# Patient Record
Sex: Female | Born: 1960 | Race: Black or African American | Hispanic: No | Marital: Married | Smoking: Never smoker
Health system: Southern US, Community
[De-identification: ages and names within clinical notes are randomized; demographics above are authoritative.]

## PROBLEM LIST (undated history)

## (undated) DIAGNOSIS — D352 Benign neoplasm of pituitary gland: Secondary | ICD-10-CM

## (undated) DIAGNOSIS — D219 Benign neoplasm of connective and other soft tissue, unspecified: Secondary | ICD-10-CM

## (undated) DIAGNOSIS — E785 Hyperlipidemia, unspecified: Secondary | ICD-10-CM

## (undated) DIAGNOSIS — I2510 Atherosclerotic heart disease of native coronary artery without angina pectoris: Secondary | ICD-10-CM

## (undated) HISTORY — DX: Atherosclerotic heart disease of native coronary artery without angina pectoris: I25.10

## (undated) HISTORY — DX: Benign neoplasm of pituitary gland: D35.2

## (undated) HISTORY — PX: MYOMECTOMY: SHX85

## (undated) HISTORY — PX: TOE SURGERY: SHX1073

---

## 2001-07-21 ENCOUNTER — Emergency Department: Admit: 2001-07-21 | Payer: Self-pay | Source: Emergency Department | Admitting: Internal Medicine

## 2006-04-11 ENCOUNTER — Ambulatory Visit: Admit: 2006-04-11 | Disposition: A | Discharge status: Home / Self Care | Payer: Self-pay | Source: Ambulatory Visit

## 2009-03-22 ENCOUNTER — Ambulatory Visit
Admit: 2009-03-22 | Disposition: A | Discharge status: Home / Self Care | Payer: Self-pay | Source: Ambulatory Visit | Admitting: Specialist

## 2013-04-04 ENCOUNTER — Encounter (HOSPITAL_COMMUNITY): Payer: Self-pay

## 2013-04-04 ENCOUNTER — Ambulatory Visit (INDEPENDENT_AMBULATORY_CARE_PROVIDER_SITE_OTHER): Payer: Federal, State, Local not specified - PPO | Admitting: Family Medicine

## 2013-04-04 ENCOUNTER — Ambulatory Visit (HOSPITAL_COMMUNITY)
Admission: RE | Admit: 2013-04-04 | Discharge: 2013-04-04 | Disposition: A | Discharge status: Home / Self Care | Payer: Federal, State, Local not specified - PPO | Source: Ambulatory Visit | Attending: Family Medicine | Admitting: Family Medicine

## 2013-04-04 VITALS — BP 126/88 | HR 73 | Temp 98.6°F | Resp 16 | Ht 64.0 in | Wt 186.0 lb

## 2013-04-04 DIAGNOSIS — R05 Cough: Secondary | ICD-10-CM

## 2013-04-04 DIAGNOSIS — R079 Chest pain, unspecified: Secondary | ICD-10-CM

## 2013-04-04 DIAGNOSIS — J9819 Other pulmonary collapse: Secondary | ICD-10-CM | POA: Insufficient documentation

## 2013-04-04 DIAGNOSIS — R059 Cough, unspecified: Secondary | ICD-10-CM

## 2013-04-04 DIAGNOSIS — R0602 Shortness of breath: Secondary | ICD-10-CM | POA: Insufficient documentation

## 2013-04-04 DIAGNOSIS — I2584 Coronary atherosclerosis due to calcified coronary lesion: Secondary | ICD-10-CM | POA: Insufficient documentation

## 2013-04-04 MED ORDER — IOHEXOL 350 MG/ML SOLN
100.0000 mL | Freq: Once | INTRAVENOUS | Status: AC | PRN
  Administered 2013-04-04: 100 mL via INTRAVENOUS

## 2013-04-04 MED ORDER — HYDROCOD POLST-CHLORPHEN POLST 10-8 MG/5ML PO LQCR: 5.0000 mL | Freq: Two times a day (BID) | ORAL | Status: DC | PRN

## 2013-04-04 NOTE — Patient Instructions (Signed)

## 2013-04-04 NOTE — Progress Notes (Signed)
Subjective:   This chart was scribed for Leslie SidleKurt Lauenstein, MD, by Leslie Hurst, scribe. The pt's care was started at 6:29 PM.    Patient ID: Leslie Hurst, female    DOB: 09/17/1960, 53 y.o.   MRN: [id number]  Chief Complaint  Patient presents with   Chest Pain    this morning off and on, sharp   Leg Pain    upper hamstring    HPI  Leslie Hurst is a 53 y.o. female who presents to [facility] complaining of sudden-onset chest pains which awakened her from sleep this morning. The chest pain returned this afternoon while the pt was eating a salad. She also reports pain to her right groin and upper leg which occurred while ambulating this morning. The pt took IBU without relief. She denies SOB or pain with inspiration. She denies any recent strenuous activities or increased weight of her travel baggage. She reports multiple transcontinental airplane trips in the past month. She passed a cardiac stress test recently.   She reports flu-like symptoms including rhinorrhea, fatigue, and a "deep, penetrating" cough a few days ago. The cough has not resolved and intermittently interrupts her sleep.   She reports her mother had an M.I. at 53 years-old.   Leslie Hurst works for the [employer].   History reviewed. No pertinent past medical history.  Past Surgical History  Procedure Laterality Date   Toe surgery     No current outpatient prescriptions on file prior to visit.   No current facility-administered medications on file prior to visit.   History   Social History   Marital Status: Married   Occupational History   Not on file.   Social History Main Topics   Smoking status: Never Smoker    Smokeless tobacco: Not on file   Alcohol Use: Not on file   Drug Use: Not on file   Sexual Activity: Not on file    Review of Systems  Constitutional: Positive for fatigue.  HENT: Positive for rhinorrhea.   Respiratory: Positive for cough. Negative for shortness of breath.   Cardiovascular:  Positive for chest pain.  Musculoskeletal: Positive for myalgias.  Neurological: Positive for weakness.  All other systems reviewed and are negative.      Objective:   Physical Exam  Nursing note and vitals reviewed. Constitutional: She is oriented to person, place, and time. She appears well-developed and well-nourished. No distress.  HENT:  Head: Normocephalic and atraumatic.  Eyes: EOM are normal.  Neck: Neck supple. No tracheal deviation present.  Cardiovascular: Normal rate.   No tachycardia.   Pulmonary/Chest: Effort normal. No respiratory distress.  Musculoskeletal: Normal range of motion.  Neurological: She is alert and oriented to person, place, and time.  Skin: Skin is warm and dry.  Psychiatric: She has a normal mood and affect. Her behavior is normal.   Vitals: BP 126/88   Pulse 73   Temp(Src) 98.6 F (37 C)   Resp 16   Ht 5\' 4"  (1.626 m)   Wt 186 lb (84.369 kg)   BMI 31.91 kg/m2   SpO2 98%     Assessment & Plan:  Chest pain - Plan: chlorpheniramine-HYDROcodone (TUSSIONEX PENNKINETIC ER) 10-8 MG/5ML LQCR, CT Angio Chest W/Cm &/Or Wo Cm, CANCELED: CT Angio Chest W/Cm &/Or Wo Cm  Cough - Plan: chlorpheniramine-HYDROcodone (TUSSIONEX PENNKINETIC ER) 10-8 MG/5ML LQCR, CT Angio Chest W/Cm &/Or Wo Cm, CANCELED: CT Angio Chest W/Cm &/Or Wo Cm  Patient's history is suspicious for pulmonary emboli  with the leg pain and the abrupt onset of chest pain. Given the fact patient had a normal stress test within the last couple months,  Cardiac etiology is very unlikely.  We will proceed with a CT angiogram to rule out pulmonary embolus tonight Signed, Leslie Sidle, MD

## 2013-04-05 ENCOUNTER — Telehealth: Payer: Self-pay

## 2013-04-05 ENCOUNTER — Emergency Department (HOSPITAL_COMMUNITY)
Admission: EM | Admit: 2013-04-05 | Discharge: 2013-04-05 | Disposition: A | Discharge status: Home / Self Care | Payer: Federal, State, Local not specified - PPO | Attending: Emergency Medicine | Admitting: Emergency Medicine

## 2013-04-05 ENCOUNTER — Encounter (HOSPITAL_COMMUNITY): Payer: Self-pay | Admitting: Emergency Medicine

## 2013-04-05 ENCOUNTER — Emergency Department (HOSPITAL_COMMUNITY): Payer: Federal, State, Local not specified - PPO

## 2013-04-05 DIAGNOSIS — R0602 Shortness of breath: Secondary | ICD-10-CM | POA: Insufficient documentation

## 2013-04-05 DIAGNOSIS — R0789 Other chest pain: Secondary | ICD-10-CM

## 2013-04-05 DIAGNOSIS — R05 Cough: Secondary | ICD-10-CM | POA: Insufficient documentation

## 2013-04-05 DIAGNOSIS — R071 Chest pain on breathing: Secondary | ICD-10-CM | POA: Insufficient documentation

## 2013-04-05 DIAGNOSIS — IMO0002 Reserved for concepts with insufficient information to code with codable children: Secondary | ICD-10-CM | POA: Insufficient documentation

## 2013-04-05 DIAGNOSIS — Z79899 Other long term (current) drug therapy: Secondary | ICD-10-CM | POA: Insufficient documentation

## 2013-04-05 DIAGNOSIS — R059 Cough, unspecified: Secondary | ICD-10-CM | POA: Insufficient documentation

## 2013-04-05 LAB — I-STAT TROPONIN, ED: TROPONIN I, POC: 0 ng/mL (ref 0.00–0.08)

## 2013-04-05 MED ORDER — KETOROLAC TROMETHAMINE 30 MG/ML IJ SOLN
30.0000 mg | Freq: Once | INTRAMUSCULAR | Status: AC
  Administered 2013-04-05: 30 mg via INTRAMUSCULAR
  Filled 2013-04-05: qty 1

## 2013-04-05 MED ORDER — PREDNISONE 20 MG PO TABS: ORAL_TABLET | ORAL | Status: AC

## 2013-04-05 MED ORDER — BENZONATATE 100 MG PO CAPS: 100.0000 mg | ORAL_CAPSULE | Freq: Three times a day (TID) | ORAL | Status: AC

## 2013-04-05 NOTE — ED Provider Notes (Signed)
CSN: [id number]     Arrival date & time 04/05/13  1034 History   First MD Initiated Contact with Patient 04/05/13 1104     Chief Complaint  Patient presents with  . Chest Pain  . Shortness of Breath     (Consider location/radiation/quality/duration/timing/severity/associated sxs/prior Treatment) HPI  This a 53 year old female with no significant past medical history who presents with chest pain. Patient was seen and evaluated yesterday at urgent care. Patient reports that yesterday morning she woke up with chest pain over her sternum. It is nonradiating. It is described as sharp. It comes and goes it is worse with breathing and coughing. Currently pain is 9/10. Patient traveled from [place] yesterday and also reported right groin pain. She was evaluated at urgent care and was sent for a CT scan which was negative for pulmonary embolus. Patient reports continued pain. She has not taken any medications today for the pain. She denies any risk factors for heart disease with the exception of early family history. Her mother was in her early 35s and she had artifact. Patient does report cough that is "deep." Pain is worse with coughing. She denies any fevers.  History reviewed. No pertinent past medical history. Past Surgical History  Procedure Laterality Date  . Toe surgery     Family History  Problem Relation Age of Onset  . Heart disease Mother   . Hypertension Mother    History  Substance Use Topics  . Smoking status: Never Smoker   . Smokeless tobacco: Not on file  . Alcohol Use: Yes   OB History   Grav Para Term Preterm Abortions TAB SAB Ect Mult Living                 Review of Systems  Constitutional: Negative for fever and chills.  Respiratory: Positive for cough. Negative for chest tightness and shortness of breath.   Cardiovascular: Positive for chest pain. Negative for leg swelling.  Gastrointestinal: Negative for nausea, vomiting and abdominal pain.  Genitourinary:  Negative for dysuria.  Musculoskeletal: Negative for back pain.  Skin: Negative for wound.  Neurological: Negative for headaches.  Psychiatric/Behavioral: Negative for confusion.  All other systems reviewed and are negative.      Allergies  Review of patient's allergies indicates no known allergies.  Home Medications   Current Outpatient Rx  Name  Route  Sig  Dispense  Refill  . fluticasone (FLONASE) 50 MCG/ACT nasal spray   Each Nare   Place 2 sprays into both nostrils daily as needed for allergies or rhinitis.         Marland Kitchen orlistat (XENICAL) 120 MG capsule   Oral   Take 120 mg by mouth 3 (three) times daily with meals.         Marland Kitchen PARoxetine Mesylate (BRISDELLE) 7.5 MG CAPS   Oral   Take 7.5 mg by mouth at bedtime.          BP 146/94  Pulse 68  Temp(Src) 98.2 F (36.8 C) (Oral)  Resp 16  SpO2 100% Physical Exam  Nursing note and vitals reviewed. Constitutional: She is oriented to person, place, and time. She appears well-developed and well-nourished.  HENT:  Head: Normocephalic and atraumatic.  Eyes: Pupils are equal, round, and reactive to light.  Neck: Neck supple.  Cardiovascular: Normal rate, regular rhythm and normal heart sounds.   No murmur heard. Pulmonary/Chest: Effort normal and breath sounds normal. No respiratory distress. She has no wheezes. She exhibits tenderness.  Abdominal: Soft.  Bowel sounds are normal. There is no tenderness.  Musculoskeletal: She exhibits no edema.  Neurological: She is alert and oriented to person, place, and time.  Skin: Skin is warm and dry.  Psychiatric: She has a normal mood and affect.    ED Course  Procedures (including critical care time) Labs Review Labs Reviewed  Rosezena Sensor, ED   Imaging Review Dg Chest 2 View  04/05/2013   CLINICAL DATA:  Left chest pain  EXAM: CHEST  2 VIEW  COMPARISON:  CT ANGIO CHEST W/CM &/OR WO/CM dated 04/04/2013  FINDINGS: Right upper lobe peripheral calcified granuloma again  identified. The lungs appear otherwise clear. Aortopulmonary window normal. No airspace opacity or pleural effusion identified. Cardiac and mediastinal contour is within normal limits. No significant abnormal retrosternal density or visualized acute bony findings.  IMPRESSION: 1. No specific radiographic abnormality to explain the patient's persistent chest pain.   Electronically Signed   By: Herbie Baltimore M.D.   On: 04/05/2013 12:15   Ct Angio Chest W/cm &/or Wo Cm  04/04/2013   CLINICAL DATA:  Shortness of breath and chest pain  EXAM: CT ANGIOGRAPHY CHEST WITH CONTRAST  TECHNIQUE: Multidetector CT imaging of the chest was performed using the standard protocol during bolus administration of intravenous contrast. Multiplanar CT image reconstructions and MIPs were obtained to evaluate the vascular anatomy.  CONTRAST:  OMNIPAQUE IOHEXOL 350 MG/ML SOLN  COMPARISON:  None.  FINDINGS: There is no demonstrable pulmonary embolus. There is no thoracic aortic aneurysm or dissection.  There is a calcified granuloma in the posterior segment of the right upper lobe.  There is mild lower lobe bronchiectatic change bilaterally. There is patchy atelectasis throughout both lower lobes. There is no airspace consolidation.  There is no appreciable thoracic adenopathy. Pericardium is not thickened. There are scattered foci of coronary artery calcification.  Visualized upper abdominal structures appear unremarkable. There are no blastic or lytic bone lesions. Thyroid appears unremarkable.  Review of the MIP images confirms the above findings.  IMPRESSION: No demonstrable pulmonary embolus. Calcified granuloma right upper lobe. Patchy lower lobe atelectatic change bilaterally. No consolidation. Mild lower lobe bronchiectatic change bilaterally. There are scattered foci of coronary artery calcification.   Electronically Signed   By: Bretta Bang M.D.   On: 04/04/2013 20:06     EKG Interpretation   Date/Time:   Monday April 05 2013 11:07:33 EDT Ventricular Rate:  67 PR Interval:  178 QRS Duration: 89 QT Interval:  445 QTC Calculation: 470 R Axis:   -68 Text Interpretation:  Sinus rhythm Left anterior fascicular block RSR' in  V1 or V2, right VCD or RVH Borderline T abnormalities, anterior leads no  prior for comparison Confirmed by HORTON  MD, COURTNEY ([id number]) on  04/05/2013 11:16:24 AM      MDM   Final diagnoses:  Chest wall pain    Patient presents with chest pain over the anterior chest.  Breath sounds are clear and she has tenderness to palpation over the anterior chest. EKG shows left anterior fascicular block with no prior for comparison. Patient reports recent stress testing that was "negative." She states that this was done in [place] at the end of the year last year. Troponinx1 is negative (8hr). Chest x-ray is reassuring. Patient was given Toradol given the reproducibility of the chest pain and worsened pain with cough. Patient reports improvement of pain with Toradol. Suspect musculoskeletal etiology. Have low suspicion at this time for ACS, heart score 3 (EKG, age 69-65, 1  risk factor). However, given patient's EKG, would have patient followup closely with her primary physician. Have instructed the patient to take 600 mg of ibuprofen every 6 hours. She was given strict return precautions.  After history, exam, and medical workup I feel the patient has been appropriately medically screened and is safe for discharge home. Pertinent diagnoses were discussed with the patient. Patient was given return precautions.     Shon Batonourtney F Horton, MD 04/05/13 [phone]

## 2013-04-05 NOTE — Telephone Encounter (Signed)
Please inform patient of normal result.  Please advise I have called in a corticosteroid to reduce inflammation

## 2013-04-05 NOTE — ED Notes (Signed)
MD at bedside. 

## 2013-04-05 NOTE — ED Notes (Signed)
Pt presents with NAD- c/o of CP/SOB - seen at urgent care yesterday for presenting complaint. CT [facility]. EDP Horton at bs to evaluate pt. Pt c/o of sudden onset to right groin pain also. Also c/o of cough for several weeks.

## 2013-04-05 NOTE — Discharge Instructions (Signed)

## 2013-04-05 NOTE — Telephone Encounter (Signed)
Pt called for results of CT and to report that her pain has worsened to about a 9 on scale of 1-10. Pain is now constant but seems to worsen w/inspiration. I had Lanora Manislizabeth review CT results in Dr Cain SaupeL's absence and advised pt that no PE was seen. Advised her that she needs to be seen for further eval. Recommended pt go to ED so that any tests could be performed there as needed. Pt agreed. Dr Elbert EwingsL, Lorain ChildesFYI

## 2013-04-05 NOTE — ED Notes (Signed)
Patient transported to X-ray 

## 2013-04-06 NOTE — Telephone Encounter (Signed)
Lm for rtn call 

## 2013-04-07 NOTE — Telephone Encounter (Signed)
Pt.notified

## 2013-10-01 ENCOUNTER — Inpatient Hospital Stay: Payer: BLUE CROSS/BLUE SHIELD | Admitting: Internal Medicine

## 2013-10-01 ENCOUNTER — Emergency Department: Payer: BLUE CROSS/BLUE SHIELD

## 2013-10-01 ENCOUNTER — Observation Stay: Payer: BLUE CROSS/BLUE SHIELD

## 2013-10-01 ENCOUNTER — Inpatient Hospital Stay
Admission: EM | Admit: 2013-10-01 | Discharge: 2013-10-09 | DRG: 234 | Disposition: A | Discharge status: Home Health | Payer: BLUE CROSS/BLUE SHIELD | Attending: Surgery | Admitting: Surgery

## 2013-10-01 DIAGNOSIS — E8779 Other fluid overload: Secondary | ICD-10-CM | POA: Diagnosis not present

## 2013-10-01 DIAGNOSIS — I2510 Atherosclerotic heart disease of native coronary artery without angina pectoris: Principal | ICD-10-CM | POA: Diagnosis present

## 2013-10-01 DIAGNOSIS — I200 Unstable angina: Secondary | ICD-10-CM | POA: Diagnosis present

## 2013-10-01 DIAGNOSIS — D62 Acute posthemorrhagic anemia: Secondary | ICD-10-CM | POA: Diagnosis not present

## 2013-10-01 DIAGNOSIS — Z8249 Family history of ischemic heart disease and other diseases of the circulatory system: Secondary | ICD-10-CM

## 2013-10-01 DIAGNOSIS — I10 Essential (primary) hypertension: Secondary | ICD-10-CM | POA: Diagnosis present

## 2013-10-01 DIAGNOSIS — E785 Hyperlipidemia, unspecified: Secondary | ICD-10-CM | POA: Diagnosis present

## 2013-10-01 DIAGNOSIS — J90 Pleural effusion, not elsewhere classified: Secondary | ICD-10-CM | POA: Diagnosis not present

## 2013-10-01 DIAGNOSIS — J45909 Unspecified asthma, uncomplicated: Secondary | ICD-10-CM | POA: Diagnosis present

## 2013-10-01 DIAGNOSIS — R7309 Other abnormal glucose: Secondary | ICD-10-CM | POA: Diagnosis not present

## 2013-10-01 DIAGNOSIS — R0789 Other chest pain: Secondary | ICD-10-CM

## 2013-10-01 DIAGNOSIS — J9819 Other pulmonary collapse: Secondary | ICD-10-CM | POA: Diagnosis not present

## 2013-10-01 DIAGNOSIS — F419 Anxiety disorder, unspecified: Secondary | ICD-10-CM | POA: Diagnosis present

## 2013-10-01 DIAGNOSIS — I4510 Unspecified right bundle-branch block: Secondary | ICD-10-CM | POA: Diagnosis present

## 2013-10-01 DIAGNOSIS — M79601 Pain in right arm: Secondary | ICD-10-CM | POA: Diagnosis present

## 2013-10-01 DIAGNOSIS — F411 Generalized anxiety disorder: Secondary | ICD-10-CM | POA: Diagnosis present

## 2013-10-01 DIAGNOSIS — Z951 Presence of aortocoronary bypass graft: Secondary | ICD-10-CM

## 2013-10-01 DIAGNOSIS — R079 Chest pain, unspecified: Secondary | ICD-10-CM | POA: Diagnosis present

## 2013-10-01 DIAGNOSIS — R931 Abnormal findings on diagnostic imaging of heart and coronary circulation: Secondary | ICD-10-CM | POA: Diagnosis present

## 2013-10-01 DIAGNOSIS — IMO0001 Reserved for inherently not codable concepts without codable children: Secondary | ICD-10-CM | POA: Diagnosis present

## 2013-10-01 HISTORY — DX: Benign neoplasm of connective and other soft tissue, unspecified: D21.9

## 2013-10-01 LAB — CBC AND DIFFERENTIAL
Basophils Absolute Automated: 0.02 10*3/uL (ref 0.00–0.20)
Basophils Automated: 0 %
Eosinophils Absolute Automated: 0.12 10*3/uL (ref 0.00–0.70)
Eosinophils Automated: 3 %
Hematocrit: 38.4 % (ref 37.0–47.0)
Hgb: 12.8 g/dL (ref 12.0–16.0)
Immature Granulocytes Absolute: 0.01 10*3/uL
Immature Granulocytes: 0 %
Lymphocytes Absolute Automated: 2.08 10*3/uL (ref 0.50–4.40)
Lymphocytes Automated: 48 %
MCH: 31.1 pg (ref 28.0–32.0)
MCHC: 33.3 g/dL (ref 32.0–36.0)
MCV: 93.4 fL (ref 80.0–100.0)
MPV: 10.5 fL (ref 9.4–12.3)
Monocytes Absolute Automated: 0.35 10*3/uL (ref 0.00–1.20)
Monocytes: 8 %
Neutrophils Absolute: 1.81 10*3/uL (ref 1.80–8.10)
Neutrophils: 41 %
Nucleated RBC: 0 /100 WBC (ref 0–1)
Platelets: 238 10*3/uL (ref 140–400)
RBC: 4.11 10*6/uL — ABNORMAL LOW (ref 4.20–5.40)
RDW: 13 % (ref 12–15)
WBC: 4.38 10*3/uL (ref 3.50–10.80)

## 2013-10-01 LAB — COMPREHENSIVE METABOLIC PANEL
ALT: 18 U/L (ref 0–55)
AST (SGOT): 26 U/L (ref 5–34)
Albumin/Globulin Ratio: 1.3 (ref 0.9–2.2)
Albumin: 4 g/dL (ref 3.5–5.0)
Alkaline Phosphatase: 74 U/L (ref 37–106)
Anion Gap: 10 (ref 5.0–15.0)
BUN: 9 mg/dL (ref 7.0–19.0)
Bilirubin, Total: 0.6 mg/dL (ref 0.2–1.2)
CO2: 25 mEq/L (ref 22–29)
Calcium: 9.5 mg/dL (ref 8.5–10.5)
Chloride: 108 mEq/L (ref 100–111)
Creatinine: 0.9 mg/dL (ref 0.6–1.0)
Globulin: 3.1 g/dL (ref 2.0–3.6)
Glucose: 86 mg/dL (ref 70–100)
Potassium: 4.8 mEq/L (ref 3.5–5.1)
Protein, Total: 7.1 g/dL (ref 6.0–8.3)
Sodium: 143 mEq/L (ref 136–145)

## 2013-10-01 LAB — TROPONIN I: Troponin I: <0.01 ng/mL (ref 0.00–0.09)

## 2013-10-01 LAB — HEMOLYSIS INDEX: Hemolysis Index: 80 — ABNORMAL HIGH (ref 0–18)

## 2013-10-01 LAB — GFR: EGFR: >60

## 2013-10-01 MED ORDER — NITROGLYCERIN 2 % TD OINT
0.5000 [in_us] | TOPICAL_OINTMENT | Freq: Four times a day (QID) | TRANSDERMAL | Status: DC | PRN
Start: 2013-10-01 — End: 2013-10-03
  Administered 2013-10-02: 0.5 [in_us] via TOPICAL
  Filled 2013-10-01: qty 1

## 2013-10-01 MED ORDER — ASPIRIN 81 MG PO CHEW
324.0000 mg | CHEWABLE_TABLET | Freq: Every day | ORAL | Status: DC
Start: 2013-10-01 — End: 2013-10-01

## 2013-10-01 MED ORDER — SODIUM CHLORIDE 0.9 % IJ SOLN
3.0000 mL | Freq: Three times a day (TID) | INTRAMUSCULAR | Status: DC
Start: 2013-10-01 — End: 2013-10-09
  Administered 2013-10-01 – 2013-10-09 (×20): 3 mL via INTRAVENOUS

## 2013-10-01 MED ORDER — PAROXETINE HCL 20 MG PO TABS
30.0000 mg | ORAL_TABLET | Freq: Every morning | ORAL | Status: DC
Start: 2013-10-02 — End: 2013-10-07
  Administered 2013-10-02 – 2013-10-07 (×4): 30 mg via ORAL
  Filled 2013-10-01: qty 1
  Filled 2013-10-01: qty 2
  Filled 2013-10-01 (×4): qty 1
  Filled 2013-10-01: qty 2
  Filled 2013-10-01: qty 1

## 2013-10-01 MED ORDER — ASPIRIN 81 MG PO TBEC
81.0000 mg | DELAYED_RELEASE_TABLET | Freq: Every day | ORAL | Status: DC
Start: 2013-10-02 — End: 2013-10-04

## 2013-10-01 MED ORDER — NON FORMULARY
0.2500 mg | Status: DC
Start: 2013-10-01 — End: 2013-10-06

## 2013-10-01 MED ORDER — ASPIRIN 81 MG PO CHEW
324.0000 mg | CHEWABLE_TABLET | Freq: Every day | ORAL | Status: DC
Start: 2013-10-02 — End: 2013-10-04
  Administered 2013-10-02 – 2013-10-04 (×3): 324 mg via ORAL
  Filled 2013-10-01 (×3): qty 4

## 2013-10-01 MED ORDER — FLUTICASONE PROPIONATE 50 MCG/ACT NA SUSP
2.0000 | Freq: Every day | NASAL | Status: DC
Start: 2013-10-02 — End: 2013-10-09
  Filled 2013-10-01: qty 16

## 2013-10-01 MED ORDER — ATORVASTATIN CALCIUM 20 MG PO TABS
20.0000 mg | ORAL_TABLET | Freq: Every evening | ORAL | Status: DC
Start: 2013-10-02 — End: 2013-10-04
  Administered 2013-10-02 – 2013-10-03 (×2): 20 mg via ORAL
  Filled 2013-10-01 (×2): qty 1

## 2013-10-01 MED ORDER — VITAMIN D (ERGOCALCIFEROL) 1.25 MG (50000 UT) PO CAPS
50000.0000 [IU] | ORAL_CAPSULE | ORAL | Status: DC
Start: 2013-10-01 — End: 2013-10-02

## 2013-10-01 MED ORDER — FLUTICASONE-SALMETEROL 250-50 MCG/DOSE IN AEPB
1.0000 | INHALATION_SPRAY | Freq: Two times a day (BID) | RESPIRATORY_TRACT | Status: DC
Start: 2013-10-01 — End: 2013-10-07
  Filled 2013-10-01: qty 14

## 2013-10-01 MED ORDER — IOHEXOL 350 MG/ML IV SOLN
100.0000 mL | Freq: Once | INTRAVENOUS | Status: AC | PRN
Start: 2013-10-01 — End: 2013-10-01
  Administered 2013-10-01: 100 mL via INTRAVENOUS

## 2013-10-01 MED ORDER — FLUTICASONE-SALMETEROL 115-21 MCG/ACT IN AERO
2.0000 | INHALATION_SPRAY | Freq: Two times a day (BID) | RESPIRATORY_TRACT | Status: DC
Start: 2013-10-01 — End: 2013-10-01

## 2013-10-01 NOTE — ED Provider Notes (Signed)
EMERGENCY DEPARTMENT HISTORY AND PHYSICAL EXAM     Physician/Midlevel provider first contact with patient: 10/01/13 1431         Date: 10/01/2013  Patient Name: [name]    History of Presenting Illness     Chief Complaint   Patient presents with   . Chest Pain   . Shortness of Breath   . Numbness       History Provided By: Patient     Chief Complaint: Chest Pain   Onset: Today  Timing: Gradually Worsening   Location: Central radiating to back   Quality: Sharp   Severity: Moderate   Modifying Factors: None   Associated Symptoms: fatigue , nausea,    Additional History: [name] is a 53 y.o. female presenting to the ED for chest pain. Patient reports that last week she was diagnosed with TIA after presenting with right arm pain and right sided facial numbness. She has a Heart scan/calcium study which was high. Today she began experiencing gradually worsening centralized chest pain radiating to back. Patient notes fatigue and nausea but denies shortness of breath, fevers, rash, urinary symptoms, vomiting. Patient was seen at [facility] last night and d/c home. Denies hx of HTN     Cardio- Dr. Lovina Reach   PCP: Ardeen Garland, MD Dr. Benancio Deeds at the [facility] in [facility]       No current facility-administered medications for this encounter.     Current Outpatient Prescriptions   Medication Sig Dispense Refill   . ammonium lactate (AMLACTIN) 12 % cream Apply topically as needed for Dry Skin.     . Azelastine-Fluticasone 137-50 MCG/ACT Suspension by Nasal route.     . benzonatate (TESSALON) 100 MG capsule Take 100 mg by mouth 3 (three) times daily as needed for Cough.     . budesonide-formoterol (SYMBICORT) 80-4.5 MCG/ACT inhaler Inhale 2 puffs into the lungs 2 (two) times daily.     . cabergoline (DOSTINEX) 0.5 MG tablet Take 0.25 mg by mouth twice a week.     . clotrimazole-betamethasone (LOTRISONE) cream Apply topically 2 (two) times daily.     . fluticasone (VERAMYST) 27.5 MCG/SPRAY nasal spray 2  sprays by Nasal route daily.     . Fluticasone-Salmeterol (ADVAIR DISKUS IN) Inhale into the lungs.     Marland Kitchen orlistat (XENICAL) 120 MG capsule Take 120 mg by mouth 3 (three) times daily with meals.     Marland Kitchen PARoxetine (PAXIL) 30 MG tablet Take 30 mg by mouth every morning.     . Vitamin D, Ergocalciferol, (DRISDOL) 50000 UNIT Cap Take 50,000 Units by mouth once a week.         Past History     Past Medical History:  Past Medical History   Diagnosis Date   . 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 Fibroid        Past Surgical History:  Past Surgical History   Procedure Laterality Date   . Toe surgery Right    . Appendectomy     . Uterine fibroid surgery         Family History:  History reviewed. No pertinent family history.    Social History:  History   Substance Use Topics   . Smoking status: Never Smoker    . Smokeless tobacco: Not on file   . Alcohol Use: Yes       Allergies:  No Known Allergies    Review of Systems  Constitutional- No Fever +fatigue   Eyes: no visual changes  Ear, Nose, Throat: No sore throat  Cardiovascular: + chest Pain  Respiratory: No Shortness of breath  GI: No abdominal pain +nausea no vomiting   Genitourinary: No dysuria  Musculoskeletal: + back pain  Skin: No rash  Neurologic:+headache   Hemo/Lymphatic: No easy bleeding  Psychiatric:no alcohol abuse      Physical Exam   BP 145/84 mmHg  Pulse 64  Temp(Src) 98.2 F (36.8 C) (Oral)  SpO2 100%    Constitutional: Vital signs reviewed. Well appearing. No distress.   Head: Normocephalic, atraumatic  Eyes: Conjunctiva and sclera are normal.  No injection or discharge.  Ears, Nose, Throat:  Normal external examination of the nose and ears.    Neck: Normal range of motion. Trachea midline.  Respiratory/Chest: Clear to auscultation. No respiratory distress.   Cardiovascular: Regular rate and rhythm. No murmurs.  Abdomen:  No rebound or guarding. Soft.  Non-tender.  Back: no cva tenderness    Upper Extremity:  No edema. No cyanosis.  Lower  Extremity:  No edema. No cyanosis.  Skin: Warm and dry. No rash.  Psychiatric:  Normal affect.  Normal insight  Neuro: intact.    Diagnostic Study Results     Labs -     Results     Procedure Component Value Units Date/Time    Troponin I [[id number]] Collected:  10/01/13 1551    Specimen Information:  Blood Updated:  10/01/13 1632     Troponin I <0.01 ng/mL     Comprehensive metabolic panel [[id number]] Collected:  10/01/13 1551    Specimen Information:  Blood Updated:  10/01/13 1625     Glucose 86 mg/dL      BUN 9.0 mg/dL      Creatinine 0.9 mg/dL      Sodium 657 mEq/L      Potassium 4.8 mEq/L      Chloride 108 mEq/L      CO2 25 mEq/L      CALCIUM 9.5 mg/dL      Protein, Total 7.1 g/dL      Albumin 4.0 g/dL      AST (SGOT) 26 U/L      ALT 18 U/L      Alkaline Phosphatase 74 U/L      Bilirubin, Total 0.6 mg/dL      Globulin 3.1 g/dL      Albumin/Globulin Ratio 1.3      Anion Gap 10.0     Hemolysis index [[id number]]  (Abnormal) Collected:  10/01/13 1551     Hemolysis Index 80 (H) Updated:  10/01/13 1625    GFR [[id number]] Collected:  10/01/13 1551     EGFR >60.0 Updated:  10/01/13 1625    CBC and differential [[id number]]  (Abnormal) Collected:  10/01/13 1551    Specimen Information:  Blood / Blood Updated:  10/01/13 1609     WBC 4.38 x10 3/uL      RBC 4.11 (L) x10 6/uL      Hgb 12.8 g/dL      Hematocrit 66.4 %      MCV 93.4 fL      MCH 31.1 pg      MCHC 33.3 g/dL      RDW 13 %      Platelets 238 x10 3/uL      MPV 10.5 fL      Neutrophils 41 %      Lymphocytes Automated 48 %      Monocytes  8 %      Eosinophils Automated 3 %      Basophils Automated 0 %      Immature Granulocyte 0 %      Nucleated RBC 0 /100 WBC      Neutrophils Absolute 1.81 x10 3/uL      Abs Lymph Automated 2.08 x10 3/uL      Abs Mono Automated 0.35 x10 3/uL      Abs Eos Automated 0.12 x10 3/uL      Absolute Baso Automated 0.02 x10 3/uL      Absolute Immature Granulocyte 0.01 x10 3/uL           Radiologic Studies -   Radiology Results (24 Hour)      Procedure Component Value Units Date/Time    XR Chest  AP Portable [[id number]] Collected:  10/01/13 1458    Order Status:  Completed Updated:  10/01/13 1502    Narrative:      History: cp    Technique: Single Portable View    Comparison: None.    Findings:  The lungs appear clear.  There is no pneumothorax.  The heart is normal in size.    The mediastinum is within normal limits.             Impression:       No active disease is seen in the chest.    Laurena Slimmer, MD   10/01/2013 2:58 PM        .      Medical Decision Making   I am the first provider for this patient.    I reviewed the vital signs, available nursing notes, past medical history, past surgical history, family history and social history.    Vital Signs-Reviewed the patient's vital signs.     Patient Vitals for the past 12 hrs:   BP Temp Pulse   10/01/13 1507 145/84 mmHg 98.2 F (36.8 C) 64       Pulse Oximetry Analysis - Normal 100% on RA     Cardiac Monitor:  Rate: 60  Rhythm:  Normal Sinus Rhythm     EKG:  Interpreted by the EP.   Time Interpreted: 1511   Rate: 64   Rhythm: Normal Sinus Rhythm    Interpretation: Left Axis Deviation, non specific st flattening     Old Medical Records: Old medical records.Nursing notes.     ED Course:   3:48 PM - discussed pt case with Dr. Katrinka Blazing (cardio) who will consult   4:48 PM- d/w dr. Richardson Chiquito- here in ed. Will admit      Provider Notes: Pt with atypical cp, high calcium score sent by ems to ed from cardiologist office for suspected unstable angina. ekg non acute. Trop neg. Will do cta and admit for serial trop    Core Measures:         Diagnosis     Clinical Impression:   1. Chest pain of uncertain etiology        _______________________________    Attestations:  This note is prepared by Levon Hedger , acting as Scribe for Tawanna Sat, MD    Tawanna Sat, MD: The scribe's documentation has been prepared under my direction and personally reviewed by me in its entirety.  I confirm that the note above  accurately reflects all work, treatment, procedures, and medical decision making performed by me.    _______________________________          Arville Care, MD  10/02/13  0756

## 2013-10-01 NOTE — H&P (Signed)
Patient Type: V     ATTENDING PHYSICIAN: Tawanna Sat, MD     CHIEF COMPLAINT:  Chest pain.     HISTORY OF PRESENT ILLNESS:  A 53 year old African-American female with past medical history of  pituitary gland tumor, history of seasonal allergies and anxiety who  presented with the complaint of chest pain.  The patient said that she was  having some facial numbness and right arm numbness.  She was seen at  [facility] emergency room.  Blood work and a CT scan  were done over there, which were negative, and she was discharged.  She has  seen yesterday at [facility], and cardiac calcium scoring was  reported at 629.  The patient was having chest pain this morning and went  to see Dr. Alver Sorrow.  She also had some numbness on the face.  Her  pain she described as the chest pain was stabbing and radiating to the  back.  The patient had an EKG done at the office of cardiology, which  reveals incomplete right bundle branch block.  Blood pressure was 142/100.   She was given aspirin 325 mg, Bystolic 10 mg, Crestor 20 mg, and patient  was sent to [facility]. Interventional cardiology was informed, Dr. Katrinka Blazing. The patient said that she still has some discomfort in the chest.  She denies nausea, vomiting or diaphoresis.  She did complain of heaviness in the right arm.     PAST MEDICAL HISTORY:  History of pituitary gland tumor, on cabergoline, and last MRI that was  done in June was not showing the tumor, history of seasonal allergies,  history of anxiety.     SOCIAL HISTORY:  She denies tobacco.  Occasional alcohol intake, which she drinks socially.   No illicit drug use.     PAST SURGICAL HISTORY:  The patient has a right toe bone spur removal, fibroid surgery and  appendectomy.     FAMILY HISTORY:  Mother had coronary artery disease and had myocardial infarction at age 28  and also had hypertension.  No history of stroke, diabetes or cancer in the  family.     PRIMARY CARE  PHYSICIAN:  Dr. Benancio Deeds.     CARDIOLOGIST:  Dr. Alver Sorrow.     HOME MEDICATIONS:  Flonase nasal spray as needed, Symbicort 80/4.5 as needed, Dostinex 0.25 mg  twice a week, Tuesday and Thursday, fluticasone nasal spray as needed,  Advair 250/50 one puff twice daily as needed, Orlistat 120 mg 3 times daily  as needed, Paxil 30 mg daily, vitamin D 50,000 units once a week.     ALLERGIES:  The patient has no known drug allergies.     REVIEW OF SYSTEMS:  CONSTITUTIONAL:  Negative fever or chills.  HEENT:  Negative sore throat, headache, congestion.  RESPIRATORY:  Negative cough, shortness of breath.  CARDIOVASCULAR:  Positive for chest pain.    MUSCULOSKELETAL:  Positive for pain in the arms and back.  GASTROINTESTINAL:  Negative nausea, vomiting, diarrhea.  EXTREMITIES:  Negative for swelling.  NEUROLOGIC:  Negative for weakness, but positive for numbness and heaviness  in the right arm.  Rest of the review of systems is negative.     PHYSICAL EXAMINATION:  VITAL SIGNS:  At the time of arrival in the emergency department, blood  pressure 145/84, heart rate 64, respirations 17, temperature 98.2, oxygen  saturation 100% on room air.  GENERAL:  The patient is well-developed, well nourished, in mild  to  moderate distress.  HEENT:  Normocephalic, atraumatic.  Pupils equal and reacting to light  bilaterally.  Nasopharynx clear.  Hearing intact.  Oropharynx clear.   Mucous membranes moist.  NECK:  Supple.  No jugular venous distention.  No lymphadenopathy.  No  carotid bruit.  LUNGS:  Clear to auscultation bilaterally.  No wheeze noted.  CARDIAC:  Normal S1, S2.  Regular rhythm.  ABDOMEN:  Soft, nondistended, nontender.  Bowel sounds positive.  EXTREMITIES:  No clubbing, cyanosis, or edema.  NEUROLOGIC:  The patient is alert, awake, oriented x3.  Able to move all 4  extremities.  No obvious gross motor or sensory deficit.     LABORATORY AND DIAGNOSTIC DATA:  White count of 4.38; hemoglobin 12.8; hematocrit 38.4;  platelets 238.   Chemistries:  Sodium 143, potassium 4.8, chloride 108, bicarbonate 25.  BUN  9, creatinine 0.9, glucose 86, calcium 9.5.  AST 26, ALT 18, alkaline  phosphatase 74, total bilirubin 0.6, total protein 7.1, albumin 4, globulin  3.1.  Troponin less than 0.01.  Chest x-ray with no active disease seen in  the chest.  EKG with normal sinus rhythm, rate of 64.  No ST elevations  noted.     ASSESSMENT AND PLAN:  A 53 year old female presented with chest pain and sent from the cardiology  office.  1.  Chest pain:  Etiology unclear, but the patient has elevated calcium  score.  Cardiology consulted, Dr. Katrinka Blazing.  Further recommendations from  cardiology.  We will do the serial cardiac enzymes and monitor.    2.  Elevated blood pressure with no prior history of hypertension:  The  patient had received Bystolic in the emergency department.  Will start the  patient on Lopressor and monitor her blood pressure.  3.  History of anxiety:  Continue on Paxil.  4.  Right arm pain:  Unclear etiology.  The patient had a CT of the head  and neck done at [facility] that was negative, as per  patient.  We will monitor.  5.  Elevated calcium score:  Cardiology is consulted.  Will also check the  lipid panel in the morning and keep the patient n.p.o., if the patient  requires any intervention.       Plan of care discussed with the patient.           D:  10/01/2013 17:33 PM by Dr. Signa Kell. [place], [place] [phone])  T:  10/01/2013 18:01 PM by       Everlean Cherry: [id number]) (Doc ID: [id number])

## 2013-10-01 NOTE — ED Notes (Signed)
Pt BIBA from [facility]. Pt started having right arm and facial numbness 10 days ago. In the last 3 days pt has been having chest pain and shortness of breath, fatigue, nausea. Pt went to [facility] ER and they did EKG and blood, consulted neuro, and sent the pt home. Today pt walked into [facility] because she was having the same sx. Pt received Bystolic 10mg , Aspirin 325mg , and Crestor 20mg .  Currently having chest pain, nausea, loss of appetite, numbness on right arm and face.

## 2013-10-01 NOTE — ED Notes (Signed)
Bed: BL22  Expected date:   Expected time:   Means of arrival:   Comments:  [id number]

## 2013-10-01 NOTE — Treatment Plan (Signed)
VTE/PE Risk Screening  Complete Upon Admission and Transfer to Different Level of Care  Completed by nurse: Leana Roe 10/01/2013 11:06 PM   -----------------------------------------------------------------------------------------------------------  SECTION 1 - Risk Screening     []   Patient currently receiving anticoagulation therapy (Heparin, Lovenox, Coumadin, Pradaxa, Xarelto, Eliquis or Arixtra Only) and received 1 dose within 24 hours of admission STOP HERE   []   VTE/PE prophylaxis currently prescribed elsewhere - STOP HERE   []   Comfort Care - STOP HERE   []   Clinical Trials - STOP HERE    Contraindications: Patients with a history of the following conditions cannot haveSequential compression devices (SCD     []  Any of these conditions present , Call MD for pharmacological prophylaxis or ask MD to document reason for not having both mechanical and pharmacologic VTE prophylaxis   []  Post-op vein ligation   []  Suspected VTE   []  Cellulitis/Dermatitis of the leg   []  Severe ischemic Vascular disease   []  Edema related to Congestive Heart Faliure   []  Gangrene   []  Recent skin graft  -----------------------------------------------------------------------------------------------------------  SECTION 2 - Risk Factors (Check all that apply)    Moderate Risk Factors   []   Heart Failure (current or history of)   []   Respiratory Failure   []   Acute Myocardial Infarction (AMI)   []   Acute Infection   []   Rheumatologic Disorder   []   Elderly age (53 years old)   []   Ongoing hormonal treatment / estrogen use (including Tamoxifen, Raloxifene)   []   Obesity (BMI >/= 30kg/m2)    High Risk Factors   []   Recent (</= 1 month) trauma/surgery    Highest Risk Factors   []   Active Cancer   []   Previous VTE   []   Reduced mobility (>24 hrs; current or anticipated)   []   Known thrombophilic condition (hematological disorders that promote thrombosis)      [x]   No boxes checked in this section indicate patient is at low risk for VTE.  No VTE Prophylaxis indicated.       []   One or more risk factors present, enter an EPIC order for Sequential compression devices (SCD). Use per protocol, MD signature required.

## 2013-10-01 NOTE — Plan of Care (Signed)
Problem: Safety  Goal: Patient will be free from injury during hospitalization  Outcome: Progressing  Pt is alert and oriented X 4. Pt was oriented to the unit and possible safety hazards. Call bell was placed by the pt and pt was instructed on using the call bell every time she needs assistance, pt demonstrated understanding. Pt was assisted with ambulation. Bed in lowest position. Pt's water and other belongings in reach on the table.     Problem: Pain  Goal: Patient's pain/discomfort is manageable  Outcome: Progressing  Pt complains of mild chest pain. Nitro paste applied. Pt is not in distress, resting comfortably.

## 2013-10-01 NOTE — Treatment Plan (Signed)
Severe Sepsis Screen  Date: 10/01/2013 Time: 11:07 PM  Nurse Signature: Epimenio Sarin. Infection:    Does your patient have ONE or more of the following infection criteria?     []  Documented Infection - Does the patient have positive culture results (from blood,        sputum, urine, etc)?   []  Anti-Infective Therapy - Is the patient receiving antibiotic, antifungal, or other                anti-infective therapy?   []  Pneumonia - Is there documentation of pneumonia (X Ray, etc)?   []  WBC's - Have WBC's been found in normally sterile fluid (urine, CSF, etc.)?   []  Perforated Viscus -Does the patient have a perforated hollow organ (bowel)?    A.  Did you check any of the boxes above?    [x]  No  If No, Stop Here and Sepsis Screen Negative.               []  Yes, continue to section B      B. SIRS:     Does your patient have TWO or more of the following SIRS criteria (ensure vital signs & temperature are within 1 hour of this screening)?    []  Temperature - Is the patient's temperature: Temp: 97.3 F (36.3 C) (10/01/13 2141)   - Greater than or equal to 38.3 degrees C (greater than 100.9 degrees F)?   - Less than or equal to 36 degrees C (less than or equal to 96.8 degrees F)?    []  Heart Rate: Heart Rate: (!) 54 (10/01/13 2141)   - Is the patient's heart rate greater than or equal to 90 bpm?    []  Respiratory: Resp Rate: 12 (10/01/13 2141)   - Is the patient's respiratory rate greater than or equal to 20?    []  WBC Count - Is the patient's WBC count:   Recent Labs  Lab 10/01/13  1551   WBC 4.38      - Greater than or equal to 12,000/mm3 OR   - Less than or equal to 4,000/mm3 OR    - Are there greater than 10% immature neutrophils (bands)?    []  Glucose >140 without diabetes?   Recent Labs  Lab 10/01/13  1551   GLUCOSE 86       []  Significant edema?    B.  Did you check two or more of the boxes above?     []  No, Stop Here and Sepsis Screen Negative   []  Yes, continue to section C    C.  Acute Organ Dysfunction      Does your patient have ONE or more of the following organ dysfunction? (May need to wait for lab results for assessment - see below) Organ dysfunction must be a result of the sepsis not chronic conditions.    []  Cardiovascular - Does the patient have a: BP: 121/82 mmHg (10/01/13 2141)   -systolic Blood pressure less than or equal to 90 mmHg OR   -systolic blood pressure has dropped 40 mmHg or more from baseline OR   -mean arterial pressure less than or equal to 70 mmHg (for at least one hour   despite fluid resuscitation OR require vasopressor support?  []  Respiratory - Does the patient have new hypoxia defined by any of the following"   -A sustained increase in oxygen requirements by at least 2L/min on NC or 28%    FiO2  within the last 24 hrs OR   -A persistent decrease in oxygen saturation of greater than or equal to 5% lasting   at least four or more hours and occurring within the last 24 hours  []  Renal - Does the patient have:   -low urine output (e.g. Less than 0.25mL/kg/HR for one hour despite adequate fluid    resuscitation, OR   -Increased creatinine (greater than 50% increase from baseline) OR   -require acute dialysis?  []  Hematologic - Does the patient have a:   -Low platelet count (less than 100,000 mm3)   Recent Labs  Lab 10/01/13  1551   PLATELETS 238   OR   -INR/aPTT greater than upper limit of normal?No results for input(s): INR in the last 168 hours. or                No results for input(s): APTT in the last 168 hours.  []  Metabolic - Does the patient have a high lactate (plasma lactate greater than or equal to 2.4 mMol/L)? No results for input(s): LACTATE in the last 168 hours.    []  Hepatic - Are the patient's liver enzymes elevated (ALT greater than 72 IU/L or Total      Bilirubin greater than 2 MG/dL)?    Recent Labs  Lab 10/01/13  1551   BILIRUBIN, TOTAL 0.6   ALT 18     []  CNS - Does the patient have altered consciousness or reduced Glasgow Coma      Scale?    C.  Did you check any of the  boxes above?     []  No, Sepsis Screen Negative   []  YES:  A) Infection + B) SIRS + C) Organ Dysfunction = Positive Screen for Severe Sepsis     Notify MD and document in Complex Assessment under provider notification   - Name of physician notified:                                         - Date/Time Notifiied:                                       Document actions: Following must be completed within 1 hour of positive sepsis screen   []  Lactate drawn (if initial lactate > , repeat lactate in 2 hours for goal decrease 10-20%)   []  Blood Cultures obtained (prior to antibiotic administration; if not done within the last 48 hours)   []  Antibiotics initiated or modified    []   IV Fluid administered 0.9% NS ___1000_______ mLs given (Initial Bolus of 30 ml/kg if SBP < 90 or MAP < 65 or lactate greater than 4 mmol/dl)  Nursing Comments?:     _________________________________________________________________    Patients meeting the following criteria are excluded from screening (check if applicable):   []  [place] hypothermia protocol   []  Comfort Care orders   []  Surgery- No screening for 24 hours after surgery (48 hours after CV surgery)       -If Yes. Date of surgery:______________________   []  Admitted with sepsis and until 72 hours after admission with documented sepsis (RESUME SEPSIS SCREEN AFTER 72 hour window!!!)      -If Yes, Date of Documented Sepsis:______________________   []  Positive screen AND Completed sepsis bundle during  previous 24 hours       -If Yes, Date/Time of positive severe sepsis screen:_______________________

## 2013-10-01 NOTE — H&P (Signed)
Full note dictated #[id number]    Assessment :  Principal Problem:    Chest pain of uncertain etiology  Active Problems:    Elevated BP    Anxiety    Elevated coronary artery calcium score    Right arm pain        Plan:  Serial enzyme   Cardiology consult- Dr.Smith  Resume home meds        Christophe Louis, DO

## 2013-10-02 ENCOUNTER — Observation Stay: Payer: BLUE CROSS/BLUE SHIELD

## 2013-10-02 LAB — TROPONIN I
Troponin I: <0.01 ng/mL (ref 0.00–0.09)
Troponin I: <0.01 ng/mL (ref 0.00–0.09)
Troponin I: <0.01 ng/mL (ref 0.00–0.09)

## 2013-10-02 MED ORDER — VITAMIN D (ERGOCALCIFEROL) 1.25 MG (50000 UT) PO CAPS
50000.0000 [IU] | ORAL_CAPSULE | ORAL | Status: DC
Start: 2013-10-07 — End: 2013-10-09
  Administered 2013-10-07: 50000 [IU] via ORAL
  Filled 2013-10-02: qty 1

## 2013-10-02 NOTE — Plan of Care (Signed)
Problem: Pain  Goal: Patient's pain/discomfort is manageable  Outcome: Progressing  The patient's learning abilities have been assessed. Today's individualized plan of care to try using nitro paste on the chest to relieve 5/10 chest pain was discussed with the patient and agree to it. Patient demonstrates understanding of disease process, treatment plan, medications and consequences of noncompliance. All questions and concerns were addressed.    Pt reported that nitro paste relieved chest pain. Went for echo. To go for cardiac cath on Mon.

## 2013-10-02 NOTE — Consults (Signed)
CARDIOLOGY CONSULTATION    Bary Richard M.D.    ?  [facility]  Office [phone]    Date Time: 10/02/2013 12:35 PM  Patient Name: [name]  Requesting Physician: Christophe Louis, DO      Reason for Consultation:   Cardiac Evaluation    Cell [phone]  Assessment:   Unstable angina.  Right arm numbness, unclear if transient ischemic attack versus angina.  Abnormal CT coronary score, 600, high  History of pituitary tumor  Family history coronary artery disease  Plan:   For her unstable angina and abnormal CT coronary score.  Patient will have cardiac cath on Monday.  Continue aspirin, Lipitor 20  We will start her on Imdur 30, for her angina.  Nitroglycerin when necessary  Echo cardiac exam today    History:   [name] is a 53 y.o. female who presents to the hospital on 10/01/2013 with family history mom with MI in her 9s presents to the hospital after having right arm numbness and right facial numbness and chest pain.  She was recently seen at Dr. Luther Red[facility] hospital on Thursday and had a CT coronary angiogram which showed a calcium score of over 600 and significant calcium buildup in her left main and left anterior descending artery.  Her chest pain is improved with nitroglycerin.  The patient is admitted for cardiac cath on Monday     Past Medical History:     Past Medical History   Diagnosis Date   . 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 Fibroid        Past Surgical History:     Past Surgical History   Procedure Laterality Date   . Toe surgery Right    . Appendectomy     . Uterine fibroid surgery         Family History:   History reviewed. No pertinent family history.    Social History:     History     Social History   . Marital Status: Married     Spouse Name: N/A     Number of Children: N/A   . Years of Education: N/A     Social History Main Topics   . Smoking status: Never Smoker    . Smokeless tobacco: Not on file   . Alcohol Use: Yes   . Drug Use: No   .  Sexual Activity: Not on file     Other Topics Concern   . Not on file     Social History Narrative   . No narrative on file       Allergies:   No Known Allergies    Medications:     Current Facility-Administered Medications   Medication Dose Route Frequency   . aspirin  324 mg Oral Daily   . aspirin EC  81 mg Oral Daily   . atorvastatin  20 mg Oral QHS   . fluticasone  2 spray Each Nare Daily   . fluticasone-salmeterol  1 puff Inhalation BID   . NON-FORMULARY order form  0.25 mg Oral See Admin Instructions   . PARoxetine  30 mg Oral QAM   . sodium chloride (PF)  3 mL Intravenous Q8H   . Vitamin D (Ergocalciferol)  50,000 Units Oral Weekly       Review of Systems:   A comprehensive review of systems was: History obtained from the patient  General ROS: negative  Psychological  ROS: negative  Ophthalmic ROS: negative  ENT ROS: negative  Allergy and Immunology ROS: negative  Hematological and Lymphatic ROS: negative  Endocrine ROS: negative  Respiratory ROS: no cough, or wheezing + shortness of breath  Cardiovascular ROS: +chest pain, -dyspnea on exertion, - palpitations, - syncope, - edema  Gastrointestinal ROS: no abdominal pain, change in bowel habits, or black or bloody stools  Musculoskeletal ROS: negative  Neurological ROS: no TIA or stroke symptoms  Dermatological ROS: negative  Rest of ROS are negative  Physical Exam:     Filed Vitals:    10/02/13 1158   BP: 125/88   Pulse: 55   Temp: 97 F (36.1 C)   Resp: 18   SpO2: 99%     General appearance - alert, well appearing, and in no distress and oriented to person, place, and time  Mental status - alert, oriented to person, place, and time, normal mood, behavior, speech, dress, motor activity, and thought processes  Mouth - mucous membranes moist, pharynx normal without lesions  Neck - supple, no significant adenopathy  Lymphatics - no palpable lymphadenopathy, no hepatosplenomegaly  Chest - clear to auscultation, no wheezes, rales or rhonchi, symmetric air entry,  no tachypnea, retractions or cyanosis  Heart - normal rate, regular rhythm, normal S1, S2,Abdomen - soft, nontender, nondistended, no masses or organomegaly  Back exam - full range of motion, no tenderness, palpable spasm or pain on motion  Musculoskeletal - no joint tenderness, deformity or swelling  Extremities - peripheral pulses normal, no pedal edema, no clubbing or cyanosis  Skin - normal coloration and turgor, no rashes, no suspicious skin lesions noted  Labs Reviewed:   Recent CBC   Recent Labs      10/01/13   1551   RBC  4.11*   HEMOGLOBIN  12.8   HEMATOCRIT  38.4   MCV  93.4   MCH, POC  31.1   MCHC  33.3   RDW  13   MPV  10.5     Recent BMP   Recent Labs      10/01/13   1551   GLUCOSE  86   BUN  9.0   CREATININE  0.9   CALCIUM  9.5   SODIUM  143   POTASSIUM  4.8   CHLORIDE  108   CO2  25     Recent CMP   Recent Labs      10/01/13   1551   GLUCOSE  86   BUN  9.0   CREATININE  0.9   SODIUM  143   CO2  25   CALCIUM  9.5   ALBUMIN  4.0   AST (SGOT)  26   ALT  18   GLOBULIN  3.1     Recent CARDIAC ENZYMES No results for input(s): TROPONIN, ISTATTROPONI, CK, CKMB in the last 24 hours.    Invalid input(s): TROPONINT  Recent TSH No results for input(s): TSH in the last 24 hours.  Rads:   Radiological Procedure reviewed.     Signed by: Samantha Crimes                      Invasive Cardiology                     12:35 PM

## 2013-10-02 NOTE — Plan of Care (Signed)
Severe Sepsis Screen  Date: 10/02/2013 Time: 8:38 AM  Nurse Signature: Helene Bernstein I Jarrius [place]ar[place]    A. Infection:    Does your patient have ONE or more of the following infection criteria?     []  Documented Infection - Does the patient have positive culture results (from blood,        sputum, urine, etc)?   []  Anti-Infective Therapy - Is the patient receiving antibiotic, antifungal, or other                anti-infective therapy?   []  Pneumonia - Is there documentation of pneumonia (X Ray, etc)?   []  WBC's - Have WBC's been found in normally sterile fluid (urine, CSF, etc.)?   []  Perforated Viscus -Does the patient have a perforated hollow organ (bowel)?    A.  Did you check any of the boxes above?    [x]  No  If No, Stop Here and Sepsis Screen Negative.               []  Yes, continue to section B      B. SIRS:     Does your patient have TWO or more of the following SIRS criteria (ensure vital signs & temperature are within 1 hour of this screening)?    []  Temperature - Is the patient's temperature: Temp: 97.7 F (36.5 C) (10/02/13 0726)   - Greater than or equal to 38.3 degrees C (greater than 100.9 degrees F)?   - Less than or equal to 36 degrees C (less than or equal to 96.8 degrees F)?    []  Heart Rate: Heart Rate: (!) 52 (10/02/13 0726)   - Is the patient's heart rate greater than or equal to 90 bpm?    []  Respiratory: Resp Rate: 18 (10/02/13 0726)   - Is the patient's respiratory rate greater than or equal to 20?    []  WBC Count - Is the patient's WBC count:   Recent Labs  Lab 10/01/13  1551   WBC 4.38      - Greater than or equal to 12,000/mm3 OR   - Less than or equal to 4,000/mm3 OR    - Are there greater than 10% immature neutrophils (bands)?    []  Glucose >140 without diabetes?   Recent Labs  Lab 10/01/13  1551   GLUCOSE 86       []  Significant edema?    B.  Did you check two or more of the boxes above?     []  No, Stop Here and Sepsis Screen Negative   []  Yes, continue to section C    C.  Acute Organ Dysfunction      Does your patient have ONE or more of the following organ dysfunction? (May need to wait for lab results for assessment - see below) Organ dysfunction must be a result of the sepsis not chronic conditions.    []  Cardiovascular - Does the patient have a: BP: (!) 124/91 mmHg (10/02/13 0726)   -systolic Blood pressure less than or equal to 90 mmHg OR   -systolic blood pressure has dropped 40 mmHg or more from baseline OR   -mean arterial pressure less than or equal to 70 mmHg (for at least one hour   despite fluid resuscitation OR require vasopressor support?  []  Respiratory - Does the patient have new hypoxia defined by any of the following"   -A sustained increase in oxygen requirements by at least 2L/min on NC or 28%  FiO2 within the last 24 hrs OR   -A persistent decrease in oxygen saturation of greater than or equal to 5% lasting   at least four or more hours and occurring within the last 24 hours  []  Renal - Does the patient have:   -low urine output (e.g. Less than 0.61mL/kg/HR for one hour despite adequate fluid    resuscitation, OR   -Increased creatinine (greater than 50% increase from baseline) OR   -require acute dialysis?  []  Hematologic - Does the patient have a:   -Low platelet count (less than 100,000 mm3)   Recent Labs  Lab 10/01/13  1551   PLATELETS 238   OR   -INR/aPTT greater than upper limit of normal?No results for input(s): INR in the last 168 hours. or                No results for input(s): APTT in the last 168 hours.  []  Metabolic - Does the patient have a high lactate (plasma lactate greater than or equal to 2.4 mMol/L)? No results for input(s): LACTATE in the last 168 hours.    []  Hepatic - Are the patient's liver enzymes elevated (ALT greater than 72 IU/L or Total      Bilirubin greater than 2 MG/dL)?    Recent Labs  Lab 10/01/13  1551   BILIRUBIN, TOTAL 0.6   ALT 18     []  CNS - Does the patient have altered consciousness or reduced Glasgow Coma      Scale?    C.  Did you check any of  the boxes above?     []  No, Sepsis Screen Negative   []  YES:  A) Infection + B) SIRS + C) Organ Dysfunction = Positive Screen for Severe Sepsis     Notify MD and document in Complex Assessment under provider notification   - Name of physician notified:                                         - Date/Time Notifiied:                                       Document actions: Following must be completed within 1 hour of positive sepsis screen   []  Lactate drawn (if initial lactate > , repeat lactate in 2 hours for goal decrease 10-20%)   []  Blood Cultures obtained (prior to antibiotic administration; if not done within the last 48 hours)   []  Antibiotics initiated or modified    []   IV Fluid administered 0.9% NS ___1000_______ mLs given (Initial Bolus of 30 ml/kg if SBP < 90 or MAP < 65 or lactate greater than 4 mmol/dl)  Nursing Comments?:     _________________________________________________________________    Patients meeting the following criteria are excluded from screening (check if applicable):   []  [place] hypothermia protocol   []  Comfort Care orders   []  Surgery- No screening for 24 hours after surgery (48 hours after CV surgery)       -If Yes. Date of surgery:______________________   []  Admitted with sepsis and until 72 hours after admission with documented sepsis (RESUME SEPSIS SCREEN AFTER 72 hour window!!!)      -If Yes, Date of Documented Sepsis:______________________   []  Positive screen AND Completed sepsis bundle  during previous 24 hours       -If Yes, Date/Time of positive severe sepsis screen:_______________________

## 2013-10-02 NOTE — Progress Notes (Signed)
INTERNAL MEDICINE PROGRESS NOTE    Date: 09/12/201510:02 AM  Patient Name:[name],[name] 53 y.o. female admitted with Chest pain of uncertain etiology  Patient status: Observation  Hospital Day: 1     Assessment:    Unstable angina- cardiac enzymes 3 negative.   Elevated blood pressure pressure stable now   Bradycardia.   Elevated coronary artery calcium score    Right arm pain   Anxiety    Plan:    Appreciate cardiology input.   Plan for cardiac cath on Monday   Continue to monitor on telemetry   Medication adjusted by cardiology.   Continue other management   Discharge plan once cleared by cardiology   Plan of care explained to patient and RN    Subjective:   C/o chest pain      Hospital problems:  Principal Problem:    Chest pain of uncertain etiology  Active Problems:    Elevated BP    Anxiety    Elevated coronary artery calcium score    Right arm pain      Medications:      Scheduled Meds: PRN Meds:      aspirin 324 mg Oral Daily   aspirin EC 81 mg Oral Daily   atorvastatin 20 mg Oral QHS   fluticasone 2 spray Each Nare Daily   fluticasone-salmeterol 1 puff Inhalation BID   NON-FORMULARY order form 0.25 mg Oral See Admin Instructions   PARoxetine 30 mg Oral QAM   sodium chloride (PF) 3 mL Intravenous Q8H   Vitamin D (Ergocalciferol) 50,000 Units Oral Weekly       Continuous Infusions:      nitroglycerin 0.5 inch Q6H PRN             Review of Systems:   General:  Fever/chills: Absent  HEENT: Negative for Headache,blurred vision,sore throat  Respiratory: Cough/ SOB- Absent  Cardiac: Chest pain +ve  Gastrointestinal: Nausea/vomiting/diarrhea/constipation-Absent, Abdominal pain- absent  Genitourinary: No burning micturition.   Musculoskeletal:Negative for - joint pain, muscular weakness or swelling   CNS/Neurological:No headache,weakness,blurred vision     Physical Exam:   BP 124/91 mmHg  Pulse 52  Temp(Src) 97.7 F (36.5 C) (Oral)  Resp 18  Ht 1.651 m (5\' 5" )  Wt 81.194 kg (179 lb)  BMI 29.79  kg/m2  SpO2 100%  LMP  (LMP Unknown)    General appearance - alert,and in no distress  HEENT: NC/AT, PERL b/l, nares clear, normal hearing, o/p clear  Neck - supple  Chest - clear to auscultation  Heart - normal S1 and S2 and regular rhythm  Abdomen - bowel sounds +ve, soft, non distended  Extremities - no pedal edema  Neurological - Alert    Labs:     Recent Labs  Lab 10/01/13  1551   WBC 4.38   HEMOGLOBIN 12.8   HEMATOCRIT 38.4   PLATELETS 238   MCV 93.4   NEUTROPHILS 41       Recent Labs  Lab 10/01/13  1551   SODIUM 143   POTASSIUM 4.8   CHLORIDE 108   CO2 25   BUN 9.0   CREATININE 0.9   GLUCOSE 86   CALCIUM 9.5   PROTEIN, TOTAL 7.1   ALBUMIN 4.0   AST (SGOT) 26   ALT 18   ALKALINE PHOSPHATASE 74   BILIRUBIN, TOTAL 0.6     Glucose:    Recent Labs  Lab 10/01/13  1551   GLUCOSE 86  Recent Labs  Lab 10/02/13  0404 10/01/13  2341 10/01/13  1551   TROPONIN I <0.01 <0.01 <0.01       Rads:   Xr Chest  Ap Portable    10/01/2013    No active disease is seen in the chest.  Laurena Slimmer, MD  10/01/2013 2:58 PM     Ct Angio Aaa Chest/ Abdomen    10/01/2013    No aortic aneurysm or dissection. No evidence of acute process.  [name], MD  10/01/2013 6:37 PM       Christophe Louis, DO  Internal Medicine  10/02/2013  10:02 AM

## 2013-10-03 LAB — LIPID PANEL
Cholesterol / HDL Ratio: 4.5
Cholesterol: 200 mg/dL — ABNORMAL HIGH (ref 0–199)
HDL: 44 mg/dL (ref >40)
LDL Calculated: 141 mg/dL — ABNORMAL HIGH (ref 0–99)
Triglycerides: 75 mg/dL (ref 34–149)
VLDL Calculated: 15 mg/dL (ref 10–40)

## 2013-10-03 LAB — ECG 12-LEAD
Atrial Rate: 64 {beats}/min
P Axis: 53 degrees
P-R Interval: 176 ms
Q-T Interval: 440 ms
QRS Duration: 72 ms
QTC Calculation (Bezet): 453 ms
R Axis: -33 degrees
T Axis: 15 degrees
Ventricular Rate: 64 {beats}/min

## 2013-10-03 LAB — HEMOLYSIS INDEX: Hemolysis Index: 11 (ref 0–18)

## 2013-10-03 MED ORDER — ISOSORBIDE MONONITRATE ER 30 MG PO TB24
30.0000 mg | ORAL_TABLET | Freq: Every day | ORAL | Status: DC
Start: 2013-10-03 — End: 2013-10-05
  Administered 2013-10-03 – 2013-10-04 (×2): 30 mg via ORAL
  Filled 2013-10-03 (×2): qty 1

## 2013-10-03 MED ORDER — ACETAMINOPHEN-CODEINE #3 300-30 MG PO TABS
1.0000 | ORAL_TABLET | Freq: Four times a day (QID) | ORAL | Status: DC | PRN
Start: 2013-10-03 — End: 2013-10-09
  Administered 2013-10-03 – 2013-10-09 (×9): 1 via ORAL
  Filled 2013-10-03 (×10): qty 1

## 2013-10-03 NOTE — Treatment Plan (Signed)
Severe Sepsis Screen  Date: 10/03/2013 Time: 3:56 AM  Nurse Signature: Epimenio Sarin. Infection:    Does your patient have ONE or more of the following infection criteria?     []  Documented Infection - Does the patient have positive culture results (from blood,        sputum, urine, etc)?   []  Anti-Infective Therapy - Is the patient receiving antibiotic, antifungal, or other                anti-infective therapy?   []  Pneumonia - Is there documentation of pneumonia (X Ray, etc)?   []  WBC's - Have WBC's been found in normally sterile fluid (urine, CSF, etc.)?   []  Perforated Viscus -Does the patient have a perforated hollow organ (bowel)?    A.  Did you check any of the boxes above?    [x]  No  If No, Stop Here and Sepsis Screen Negative.               []  Yes, continue to section B      B. SIRS:     Does your patient have TWO or more of the following SIRS criteria (ensure vital signs & temperature are within 1 hour of this screening)?    []  Temperature - Is the patient's temperature: Temp: 97.8 F (36.6 C) (10/03/13 0300)   - Greater than or equal to 38.3 degrees C (greater than 100.9 degrees F)?   - Less than or equal to 36 degrees C (less than or equal to 96.8 degrees F)?    []  Heart Rate: Heart Rate: 62 (10/03/13 0300)   - Is the patient's heart rate greater than or equal to 90 bpm?    []  Respiratory: Resp Rate: 18 (10/03/13 0300)   - Is the patient's respiratory rate greater than or equal to 20?    []  WBC Count - Is the patient's WBC count:   Recent Labs  Lab 10/01/13  1551   WBC 4.38      - Greater than or equal to 12,000/mm3 OR   - Less than or equal to 4,000/mm3 OR    - Are there greater than 10% immature neutrophils (bands)?    []  Glucose >140 without diabetes?   Recent Labs  Lab 10/01/13  1551   GLUCOSE 86       []  Significant edema?    B.  Did you check two or more of the boxes above?     []  No, Stop Here and Sepsis Screen Negative   []  Yes, continue to section C    C.  Acute Organ Dysfunction     Does  your patient have ONE or more of the following organ dysfunction? (May need to wait for lab results for assessment - see below) Organ dysfunction must be a result of the sepsis not chronic conditions.    []  Cardiovascular - Does the patient have a: BP: 130/78 mmHg (10/03/13 0300)   -systolic Blood pressure less than or equal to 90 mmHg OR   -systolic blood pressure has dropped 40 mmHg or more from baseline OR   -mean arterial pressure less than or equal to 70 mmHg (for at least one hour   despite fluid resuscitation OR require vasopressor support?  []  Respiratory - Does the patient have new hypoxia defined by any of the following"   -A sustained increase in oxygen requirements by at least 2L/min on NC or 28%    FiO2 within  the last 24 hrs OR   -A persistent decrease in oxygen saturation of greater than or equal to 5% lasting   at least four or more hours and occurring within the last 24 hours  []  Renal - Does the patient have:   -low urine output (e.g. Less than 0.30mL/kg/HR for one hour despite adequate fluid    resuscitation, OR   -Increased creatinine (greater than 50% increase from baseline) OR   -require acute dialysis?  []  Hematologic - Does the patient have a:   -Low platelet count (less than 100,000 mm3)   Recent Labs  Lab 10/01/13  1551   PLATELETS 238   OR   -INR/aPTT greater than upper limit of normal?No results for input(s): INR in the last 168 hours. or                No results for input(s): APTT in the last 168 hours.  []  Metabolic - Does the patient have a high lactate (plasma lactate greater than or equal to 2.4 mMol/L)? No results for input(s): LACTATE in the last 168 hours.    []  Hepatic - Are the patient's liver enzymes elevated (ALT greater than 72 IU/L or Total      Bilirubin greater than 2 MG/dL)?    Recent Labs  Lab 10/01/13  1551   BILIRUBIN, TOTAL 0.6   ALT 18     []  CNS - Does the patient have altered consciousness or reduced Glasgow Coma      Scale?    C.  Did you check any of the boxes  above?     []  No, Sepsis Screen Negative   []  YES:  A) Infection + B) SIRS + C) Organ Dysfunction = Positive Screen for Severe Sepsis     Notify MD and document in Complex Assessment under provider notification   - Name of physician notified:                                         - Date/Time Notifiied:                                       Document actions: Following must be completed within 1 hour of positive sepsis screen   []  Lactate drawn (if initial lactate > , repeat lactate in 2 hours for goal decrease 10-20%)   []  Blood Cultures obtained (prior to antibiotic administration; if not done within the last 48 hours)   []  Antibiotics initiated or modified    []   IV Fluid administered 0.9% NS ___1000_______ mLs given (Initial Bolus of 30 ml/kg if SBP < 90 or MAP < 65 or lactate greater than 4 mmol/dl)  Nursing Comments?:     _________________________________________________________________    Patients meeting the following criteria are excluded from screening (check if applicable):   []  [place] hypothermia protocol   []  Comfort Care orders   []  Surgery- No screening for 24 hours after surgery (48 hours after CV surgery)       -If Yes. Date of surgery:______________________   []  Admitted with sepsis and until 72 hours after admission with documented sepsis (RESUME SEPSIS SCREEN AFTER 72 hour window!!!)      -If Yes, Date of Documented Sepsis:______________________   []  Positive screen AND Completed sepsis bundle during previous  24 hours       -If Yes, Date/Time of positive severe sepsis screen:_______________________

## 2013-10-03 NOTE — Progress Notes (Signed)
INTERNAL MEDICINE PROGRESS NOTE    Date: 09/13/201510:30 AM  Patient Name:KirstenKirsten Morton 53 y.o. female admitted with Chest pain of uncertain etiology  Patient status: Inpatient  Hospital Day: 2     Assessment:    Unstable angina- cardiac enzymes 3 negative.   Elevated blood pressure pressure stable now   Bradycardia.   Elevated coronary artery calcium score    Right arm pain   Facial numbness   Anxiety.   Hyperlipidemia    Plan:    Consult neurology.   Plan for cardiac cath on Monday   Continue to monitor on telemetry   Continue other management   Discharge plan once cleared by cardiology/neurology    Plan of care explained to patient and RN    Subjective:   Complaint of facial numbness.  Denies chest pain or right arm pain     Hospital problems:  Principal Problem:    Chest pain of uncertain etiology  Active Problems:    Elevated BP    Anxiety    Elevated coronary artery calcium score    Right arm pain      Medications:      Scheduled Meds: PRN Meds:        aspirin 324 mg Oral Daily   aspirin EC 81 mg Oral Daily   atorvastatin 20 mg Oral QHS   fluticasone 2 spray Each Nare Daily   fluticasone-salmeterol 1 puff Inhalation BID   isosorbide mononitrate 30 mg Oral Daily   NON-FORMULARY order form 0.25 mg Oral See Admin Instructions   PARoxetine 30 mg Oral QAM   sodium chloride (PF) 3 mL Intravenous Q8H   [START ON 10/07/2013] Vitamin D (Ergocalciferol) 50,000 Units Oral Weekly       Continuous Infusions:             Review of Systems:   General:  Fever/chills: Absent  HEENT: Negative for Headache,blurred vision,sore throat  Respiratory: Cough/ SOB- Absent  Cardiac: Chest pain +ve  Gastrointestinal: Nausea/vomiting/diarrhea/constipation-Absent, Abdominal pain- absent  Genitourinary: No burning micturition.   Musculoskeletal:Negative for - joint pain, muscular weakness or swelling   CNS/Neurological:No headache,weakness,blurred vision     Physical Exam:   BP 148/88 mmHg  Pulse 61  Temp(Src) 98.1 F (36.7  C) (Oral)  Resp 18  Ht 1.651 m (5\' 5" )  Wt 81.194 kg (179 lb)  BMI 29.79 kg/m2  SpO2 97%  LMP  (LMP Unknown)    General appearance - alert,and in no distress  HEENT: NC/AT, PERL b/l, nares clear, normal hearing, o/p clear  Neck - supple  Chest - clear to auscultation  Heart - normal S1 and S2 and regular rhythm  Abdomen - bowel sounds +ve, soft, non distended  Extremities - no pedal edema  Neurological - Alert    Labs:       Recent Labs  Lab 10/01/13  1551   WBC 4.38   HEMOGLOBIN 12.8   HEMATOCRIT 38.4   PLATELETS 238   MCV 93.4   NEUTROPHILS 41       Recent Labs  Lab 10/01/13  1551   SODIUM 143   POTASSIUM 4.8   CHLORIDE 108   CO2 25   BUN 9.0   CREATININE 0.9   GLUCOSE 86   CALCIUM 9.5   PROTEIN, TOTAL 7.1   ALBUMIN 4.0   AST (SGOT) 26   ALT 18   ALKALINE PHOSPHATASE 74   BILIRUBIN, TOTAL 0.6     Glucose:      Recent  Labs  Lab 10/01/13  1551   GLUCOSE 86       Recent Labs  Lab 10/02/13  1046 10/02/13  0404 10/01/13  2341   TROPONIN I <0.01 <0.01 <0.01       Rads:   No results found.    Kirsten Louis, DO  Internal Medicine  10/03/2013  10:30 AM

## 2013-10-03 NOTE — Consults (Signed)
CARDIOLOGY PROGRESS NOTE    Bary Richard M.D.  ?  [facility]  Office [phone]      Date Time: 10/03/2013 8:38 AM  Patient Name: [name], [name]      Cell [phone]  Subjective:   No acute events o/n.    Assessment:   Unstable angina.  Right arm numbness, unclear if transient ischemic attack versus angina.  Abnormal CT coronary score, 600, high  History of pituitary tumor  Family history coronary artery disease    Echo normal EF  Plan:   For her unstable angina and abnormal CT coronary score.  Patient will have cardiac cath on Monday.  Continue aspirin 81, Lipitor 20  We will start her on Imdur 30, for her angina.  Medications:     Current Facility-Administered Medications   Medication Dose Route Frequency   . aspirin  324 mg Oral Daily   . aspirin EC  81 mg Oral Daily   . atorvastatin  20 mg Oral QHS   . fluticasone  2 spray Each Nare Daily   . fluticasone-salmeterol  1 puff Inhalation BID   . NON-FORMULARY order form  0.25 mg Oral See Admin Instructions   . PARoxetine  30 mg Oral QAM   . sodium chloride (PF)  3 mL Intravenous Q8H   . [START ON 10/07/2013] Vitamin D (Ergocalciferol)  50,000 Units Oral Weekly       Review of Systems:   A comprehensive review of systems was: History obtained from the patient  General ROS: negative  Psychological ROS: negative  Ophthalmic ROS: negative  ENT ROS: negative  Allergy and Immunology ROS: negative  Hematological and Lymphatic ROS: negative  Endocrine ROS: negative  Respiratory ROS: no cough, or wheezing + shortness of breath  Cardiovascular ROS: +chest pain, -dyspnea on exertion, - palpitations, - syncope, - edema  Gastrointestinal ROS: no abdominal pain, change in bowel habits, or black or bloody stools  Musculoskeletal ROS: negative  Neurological ROS: no TIA or stroke symptoms  Dermatological ROS: negative  Rest of ROS are negative    Physical Exam:     Filed Vitals:    10/03/13 0749   BP: 148/88   Pulse: 61   Temp: 98.1 F (36.7 C)    Resp: 18   SpO2: 97%   General appearance - alert, well appearing, and in no distress and oriented to person, place, and time  Mental status - alert, oriented to person, place, and time, normal mood, behavior, speech, dress, motor activity, and thought processes  Mouth - mucous membranes moist, pharynx normal without lesions  Neck - supple, no significant adenopathy  Lymphatics - no palpable lymphadenopathy, no hepatosplenomegaly  Chest - clear to auscultation, no wheezes, rales or rhonchi, symmetric air entry, no tachypnea, retractions or cyanosis  Heart - normal rate, regular rhythm, normal S1, S2,Abdomen - soft, nontender, nondistended, no masses or organomegaly  Back exam - full range of motion, no tenderness, palpable spasm or pain on motion  Musculoskeletal - no joint tenderness, deformity or swelling  Extremities - peripheral pulses normal, no pedal edema, no clubbing or cyanosis  Skin - normal coloration and turgor, no rashes, no suspicious skin lesions noted    Labs:     Recent CBC No results for input(s): RBC, HGB, HCT, MCV, MCH, MCHC, RDW, MPV, LABPLAT in the last 24 hours.    Invalid input(s): WHITEBLOODCE,  NRBCA,  REFLX,  ANRBA  Recent BMP No results for input(s): GLU,  BUN, CREAT, CA, NA, K, CL, CO2 in the last 24 hours.    Invalid input(s): AGAP  Recent CMP No results for input(s): GLU, BUN, CREAT, NA, CK, CO2, CA, ALB, AST, ALT, GLOB in the last 24 hours.    Invalid input(s):  CL, TP, ALP, BILIT, AG,  AGAP  Recent CARDIAC ENZYMES No results for input(s): TROPONIN, ISTATTROPONI, CK, CKMB in the last 24 hours.    Invalid input(s): TROPONINT  Recent TSH No results for input(s): TSH in the last 24 hours.    Rads:   Radiological Procedure reviewed.    Signed by: Samantha Crimes                      Invasive Cardiology                      8:38 AM

## 2013-10-03 NOTE — Plan of Care (Signed)
Problem: Chest Pain  Goal: Vital signs and cardiac rhythm stable  Outcome: Progressing  Pt complains of mild chest pain. Nitro paste is on. Vital signs are stable. Pt denies shortness of breath. Resting comfortably, no distress. Cardiac diet. NPO Sunday at Midnight for cardiac cath on Monday.

## 2013-10-03 NOTE — Plan of Care (Signed)
Problem: Chest Pain  Goal: Patient/Patient Care Companion demonstrates understanding of disease process, treatment plan, medications, and discharge plan  Intervention: Educate patient/caregiver regarding identified learning needs  Extensive conversation today with patient and spouse about the risk factors for developing cardiac disease.  BP and cholesterol profile also discussed with patient.    Patient and husband now have a better understanding of her numbers and what they mean to her health.

## 2013-10-03 NOTE — Consults (Signed)
NEUROLOGY CONSULATION    Date Time: 10/03/2013 2:49 PM  Patient Name: [name]  Attending Physician: Christophe Louis, DO      Assessment & Plan:   R face/arm subjective numbness and some missed targets (? Sensory-related) using R arm - r/o ischemic stroke.  Given prior negative work up this could represent migraine sensory aura.  Would like to obtain MRI brain imaging prior to her catheterization in case she gets a cardiac stent.     ASA for now   MRI brain and MRA head/neck vessels   Further work up depending on these results    History of Present Illness:   53 yo female with hx prolactinoma x years who reports that 2 months ago, she went to a [place] hospital due to R face and R arm numbness and paresthesias - with initial negative ER work up and eventually, as outpt, got MRI brain which revealed no acute ischemia.  Pt is reporting again R face and then R arm numbness and paresthesias.  There is no associated headache but she is also experiencing a type of throbbing chest pain and recent imaging revealed high cardiac calcium score.  Her cardiologist found her two days ago to have elevated BP and given the chest pain - sent her in for cardiac catheterization.  She does have a headache now but it is not her typical migraine and she reports it started after starting nitrates here in the hospital.      Past Medical History:     Past Medical History   Diagnosis Date   . 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 Fibroid        Meds:      Scheduled Meds: PRN Meds:      aspirin 324 mg Oral Daily   aspirin EC 81 mg Oral Daily   atorvastatin 20 mg Oral QHS   fluticasone 2 spray Each Nare Daily   fluticasone-salmeterol 1 puff Inhalation BID   isosorbide mononitrate 30 mg Oral Daily   NON-FORMULARY order form 0.25 mg Oral See Admin Instructions   PARoxetine 30 mg Oral QAM   sodium chloride (PF) 3 mL Intravenous Q8H   [START ON 10/07/2013] Vitamin D (Ergocalciferol) 50,000 Units Oral Weekly       Continuous Infusions:            I personally reviewed all of the medications    No Known Allergies    Social & Family History:     History     Social History   . Marital Status: Married     Spouse Name: N/A     Number of Children: N/A   . Years of Education: N/A     Occupational History   . Not on file.     Social History Main Topics   . Smoking status: Never Smoker    . Smokeless tobacco: Not on file   . Alcohol Use: Yes   . Drug Use: No   . Sexual Activity: Not on file     Other Topics Concern   . Not on file     Social History Narrative   . No narrative on file     History reviewed. No pertinent family history.    Review of Systems:   No headache, eye, ear nose, throat problems; no coughing or wheezing or shortness of breath, No chest pain or orthopnea, no abdominal pain, nausea or vomiting, No pain in the body  or extremities, no psychiatric, neurological, endocrine, hematological or cardiac complaints except as noted above.     Physical Exam:   Blood pressure 124/84, pulse 51, temperature 96.8 F (36 C), temperature source Oral, resp. rate 16, height 1.651 m (5\' 5" ), weight 81.194 kg (179 lb), SpO2 98 %.    HEENT: Normocephalic. Non-icter, no congestion, no carotid bruits  Lungs:  CTA bil  Cardiac:  S1,S2, normal rate and rhythm  Neck: supple, no lymphadenopathy, no thyromegaly, no JVD, no cartoid bruits  Extremities: no clubbing, cyanosis, or edema  Skin: no rashes or lesions noted    Neuro:  Level of consciousness:  Alert and appropriate  Oriented:  X 3  Cognition:  Intact naming, recognition, concentration and following complex commands  Cranial Nerves:  II-XII intact  Strength:  No upper extremity drift, 5/5 strength x 4 extremities  Coordination:  Intact FTN testing except misses using R hand to nose when eyes are closed.  Reflexes:  +2 throughout, down going toes bil  Sensation: Intact x 4 extremities to LT, temp, vibration  Gait:  Deferred     Labs:     Recent Labs  Lab 10/01/13  1551   GLUCOSE 86   BUN 9.0   CREATININE 0.9    CALCIUM 9.5   SODIUM 143   POTASSIUM 4.8   CHLORIDE 108   CO2 25   ALBUMIN 4.0   AST (SGOT) 26   ALT 18   BILIRUBIN, TOTAL 0.6   ALKALINE PHOSPHATASE 74       Recent Labs  Lab 10/01/13  1551   WBC 4.38   HEMOGLOBIN 12.8   HEMATOCRIT 38.4   MCV 93.4   MCH, POC 31.1   MCHC 33.3   PLATELETS 238         No results for input(s): PTT, PT, INR in the last 72 hours.       Radiology Results (24 Hour)     ** No results found for the last 24 hours. **           All recent brain and spine imaging (MRI, CT) personally reviewed.    Chart reviewed    Case discussed with: pt, family, Dr. Richardson Chiquito    ; >50% time spent in counseling or coordination of care    Signed by: Ardelle Anton, MD  Spectralink: [phone]       Answering Service: [phone]

## 2013-10-04 ENCOUNTER — Inpatient Hospital Stay: Payer: BLUE CROSS/BLUE SHIELD

## 2013-10-04 ENCOUNTER — Encounter
Admission: EM | Disposition: A | Discharge status: Home Health | Payer: Self-pay | Source: Home / Self Care | Attending: Surgery

## 2013-10-04 ENCOUNTER — Ambulatory Visit: Admit: 2013-10-04 | Payer: Self-pay | Admitting: Cardiovascular Disease

## 2013-10-04 SURGERY — LEFT HEART CATH POSS PCI
Laterality: Left

## 2013-10-04 MED ORDER — VERAPAMIL HCL 2.5 MG/ML IV SOLN
INTRAVENOUS | Status: AC
Start: 2013-10-04 — End: 2013-10-04
  Administered 2013-10-04: 5 mg via INTRAVENOUS
  Filled 2013-10-04: qty 2

## 2013-10-04 MED ORDER — IODIXANOL 320 MG/ML IV SOLN
99.0000 mL | Freq: Once | INTRAVENOUS | Status: AC | PRN
Start: 2013-10-04 — End: 2013-10-04
  Administered 2013-10-04: 99 mL via INTRAVENOUS

## 2013-10-04 MED ORDER — ASPIRIN 81 MG PO CHEW
81.0000 mg | CHEWABLE_TABLET | Freq: Every day | ORAL | Status: DC
Start: 2013-10-05 — End: 2013-10-05

## 2013-10-04 MED ORDER — DEXTROSE 5 % IV SOLN
5.0000 g | Freq: Once | INTRAVENOUS | Status: AC
Start: 2013-10-05 — End: 2013-10-05
  Administered 2013-10-05: 5 g via INTRAVENOUS
  Filled 2013-10-04: qty 20

## 2013-10-04 MED ORDER — RINGERS IV SOLN
Freq: Once | INTRAVENOUS | Status: AC
Start: 2013-10-05 — End: 2013-10-05
  Filled 2013-10-04: qty 0.5

## 2013-10-04 MED ORDER — ALBUMIN HUMAN 5 % IV SOLN
12.5000 g | Freq: Once | INTRAVENOUS | Status: DC
Start: 2013-10-05 — End: 2013-10-09
  Filled 2013-10-04: qty 500
  Filled 2013-10-04: qty 250

## 2013-10-04 MED ORDER — HEPARIN SODIUM (PORCINE) 5000 UNIT/ML IJ SOLN
5000.0000 [IU] | Freq: Two times a day (BID) | INTRAMUSCULAR | Status: DC
Start: 2013-10-04 — End: 2013-10-05
  Administered 2013-10-04: 5000 [IU] via SUBCUTANEOUS
  Filled 2013-10-04: qty 1

## 2013-10-04 MED ORDER — MIDAZOLAM HCL 2 MG/2ML IJ SOLN
INTRAMUSCULAR | Status: DC
Start: 2013-10-04 — End: 2013-10-04
  Filled 2013-10-04: qty 2

## 2013-10-04 MED ORDER — SODIUM CHLORIDE 0.9 % IV MBP
1.5000 g | Freq: Once | INTRAVENOUS | Status: AC | PRN
Start: 2013-10-05 — End: 2013-10-05
  Administered 2013-10-05: 1.5 g via INTRAVENOUS
  Filled 2013-10-04: qty 1500

## 2013-10-04 MED ORDER — FENTANYL CITRATE 0.05 MG/ML IJ SOLN
INTRAMUSCULAR | Status: AC
Start: 2013-10-04 — End: 2013-10-04
  Administered 2013-10-04: 50 ug via INTRAVENOUS
  Filled 2013-10-04: qty 2

## 2013-10-04 MED ORDER — MUPIROCIN 2% NASAL OINTMENT
TOPICAL_OINTMENT | Freq: Four times a day (QID) | CUTANEOUS | Status: AC
Start: 2013-10-05 — End: 2013-10-05
  Administered 2013-10-05 (×2): 1 via NASAL
  Filled 2013-10-04 (×2): qty 1

## 2013-10-04 MED ORDER — SODIUM CHLORIDE 0.9 % IV SOLN
0.1000 [IU]/kg/h | INTRAVENOUS | Status: DC
Start: 2013-10-05 — End: 2013-10-05
  Filled 2013-10-04 (×2): qty 1

## 2013-10-04 MED ORDER — SODIUM CHLORIDE 0.9 % IV SOLN
1.0000 ug/min | INTRAVENOUS | Status: DC
Start: 2013-10-05 — End: 2013-10-05
  Filled 2013-10-04: qty 4

## 2013-10-04 MED ORDER — ATORVASTATIN CALCIUM 40 MG PO TABS
40.0000 mg | ORAL_TABLET | Freq: Every evening | ORAL | Status: DC
Start: 2013-10-04 — End: 2013-10-09
  Administered 2013-10-04 – 2013-10-08 (×5): 40 mg via ORAL
  Filled 2013-10-04 (×5): qty 1

## 2013-10-04 MED ORDER — HEPARIN SODIUM (PORCINE) 1000 UNIT/ML IJ SOLN
INTRAMUSCULAR | Status: AC
Start: 2013-10-04 — End: 2013-10-04
  Administered 2013-10-04: 3750 [IU] via INTRAVENOUS
  Filled 2013-10-04: qty 10

## 2013-10-04 MED ORDER — NITROGLYCERIN IN D5W 200-5 MCG/ML-% IV SOLN
INTRAVENOUS | Status: AC
Start: 2013-10-04 — End: 2013-10-04
  Administered 2013-10-04: 400 ug via INTRA_ARTERIAL
  Filled 2013-10-04: qty 10

## 2013-10-04 MED ORDER — CARDIOPLEGIA (MAINTENANCE) KCL 20 MEQ/SODIUM CHLORIDE 0.9% 25L
Freq: Once | Status: DC
Start: 2013-10-05 — End: 2013-10-05
  Filled 2013-10-04: qty 250

## 2013-10-04 MED ORDER — NOREPINEPHRINE BITARTRATE 1 MG/ML IV SOLN
2.0000 ug/min | Freq: Once | INTRAVENOUS | Status: DC
Start: 2013-10-05 — End: 2013-10-05
  Filled 2013-10-04: qty 8

## 2013-10-04 MED ORDER — LIDOCAINE HCL (PF) 1 % IJ SOLN
INTRAMUSCULAR | Status: AC
Start: 2013-10-04 — End: 2013-10-04
  Administered 2013-10-04: 1 mL via INTRAVENOUS
  Filled 2013-10-04: qty 30

## 2013-10-04 MED ORDER — DEXTROSE 5 % IV SOLN
1.0000 g/h | Freq: Once | INTRAVENOUS | Status: AC
Start: 2013-10-05 — End: 2013-10-05
  Administered 2013-10-05: 1 g/h via INTRAVENOUS
  Filled 2013-10-04: qty 80

## 2013-10-04 MED ORDER — METOPROLOL TARTRATE 25 MG PO TABS
12.5000 mg | ORAL_TABLET | Freq: Two times a day (BID) | ORAL | Status: DC
Start: 2013-10-04 — End: 2013-10-05
  Administered 2013-10-04: 12.5 mg via ORAL
  Filled 2013-10-04: qty 1

## 2013-10-04 MED ORDER — SODIUM CHLORIDE 0.9 % IV SOLN
50.0000 ug/min | Freq: Once | INTRAVENOUS | Status: DC
Start: 2013-10-05 — End: 2013-10-05
  Filled 2013-10-04: qty 10

## 2013-10-04 MED ORDER — ACETAMINOPHEN 325 MG PO TABS
650.0000 mg | ORAL_TABLET | ORAL | Status: DC | PRN
Start: 2013-10-04 — End: 2013-10-05

## 2013-10-04 MED ORDER — GADOBUTROL 1 MMOL/ML IV SOLN
10.0000 mL | Freq: Once | INTRAVENOUS | Status: AC | PRN
Start: 2013-10-04 — End: 2013-10-04
  Administered 2013-10-04: 10 mmol via INTRAVENOUS

## 2013-10-04 MED ORDER — CARDIOPLEGIA (ARRESTING) KCL 100 MEQ/SODIUM CHLORIDE 0.9% 250L
Freq: Once | Status: AC
Start: 2013-10-05 — End: 2013-10-05
  Filled 2013-10-04: qty 250

## 2013-10-04 MED ORDER — CARDIOPLEGIA (ENRICHED) TROMETHAMINE/CITRATE-PHOS-DEX SOLN/GLUTS
Freq: Once | Status: DC
Start: 2013-10-05 — End: 2013-10-05
  Filled 2013-10-04: qty 461

## 2013-10-04 MED ORDER — MIDAZOLAM HCL 2 MG/2ML IJ SOLN
INTRAMUSCULAR | Status: AC
Start: 2013-10-04 — End: 2013-10-04
  Administered 2013-10-04: 2 mg via INTRAVENOUS
  Filled 2013-10-04: qty 2

## 2013-10-04 MED ORDER — METOPROLOL TARTRATE 25 MG PO TABS
25.0000 mg | ORAL_TABLET | Freq: Two times a day (BID) | ORAL | Status: DC
Start: 2013-10-04 — End: 2013-10-04
  Filled 2013-10-04: qty 1

## 2013-10-04 MED ORDER — CHLORHEXIDINE GLUCONATE 0.12 % MT SOLN
15.0000 mL | Freq: Once | OROMUCOSAL | Status: AC
Start: 2013-10-05 — End: 2013-10-05
  Administered 2013-10-05: 15 mL via OROMUCOSAL
  Filled 2013-10-04: qty 15

## 2013-10-04 NOTE — Plan of Care (Signed)
Problem: Chest Pain  Goal: Vital signs and cardiac rhythm stable  The patient and care giver's learning abilities have been assessed. Today's individualized plan of care to have cardiac catheterization, monitor vital signs, monitor labs and manage pain was discussed with the patient and care giver and agree to it. Patient and care giver demonstrates understanding of disease process, treatment plan, medications and consequences of noncompliance. All questions and concerns were addressed.

## 2013-10-04 NOTE — Plan of Care (Signed)
Severe Sepsis Screen  Date: 10/04/2013 Time: 11:07 PM  Nurse Signature: Erlinda Hong    A. Infection:    Does your patient have ONE or more of the following infection criteria?     []  Documented Infection - Does the patient have positive culture results (from blood,        sputum, urine, etc)?   []  Anti-Infective Therapy - Is the patient receiving antibiotic, antifungal, or other                anti-infective therapy?   []  Pneumonia - Is there documentation of pneumonia (X Ray, etc)?   []  WBC's - Have WBC's been found in normally sterile fluid (urine, CSF, etc.)?   []  Perforated Viscus -Does the patient have a perforated hollow organ (bowel)?    A.  Did you check any of the boxes above?    [x]  No  If No, Stop Here and Sepsis Screen Negative.               []  Yes, continue to section B      B. SIRS:     Does your patient have TWO or more of the following SIRS criteria (ensure vital signs & temperature are within 1 hour of this screening)?    []  Temperature - Is the patient's temperature: Temp: 97.5 F (36.4 C) (10/04/13 1705)   - Greater than or equal to 38.3 degrees C (greater than 100.9 degrees F)?   - Less than or equal to 36 degrees C (less than or equal to 96.8 degrees F)?    []  Heart Rate: Heart Rate: (!) 56 (10/04/13 2117)   - Is the patient's heart rate greater than or equal to 90 bpm?    []  Respiratory: Resp Rate: 18 (10/04/13 1900)   - Is the patient's respiratory rate greater than or equal to 20?    []  WBC Count - Is the patient's WBC count:   Recent Labs  Lab 10/01/13  1551   WBC 4.38      - Greater than or equal to 12,000/mm3 OR   - Less than or equal to 4,000/mm3 OR    - Are there greater than 10% immature neutrophils (bands)?    []  Glucose >140 without diabetes?   Recent Labs  Lab 10/01/13  1551   GLUCOSE 86       []  Significant edema?    B.  Did you check two or more of the boxes above?     []  No, Stop Here and Sepsis Screen Negative   []  Yes, continue to section C    C.  Acute Organ Dysfunction      Does your patient have ONE or more of the following organ dysfunction? (May need to wait for lab results for assessment - see below) Organ dysfunction must be a result of the sepsis not chronic conditions.    []  Cardiovascular - Does the patient have a: BP: 119/67 mmHg (10/04/13 2117)   -systolic Blood pressure less than or equal to 90 mmHg OR   -systolic blood pressure has dropped 40 mmHg or more from baseline OR   -mean arterial pressure less than or equal to 70 mmHg (for at least one hour   despite fluid resuscitation OR require vasopressor support?  []  Respiratory - Does the patient have new hypoxia defined by any of the following"   -A sustained increase in oxygen requirements by at least 2L/min on NC or 28%    FiO2 within  the last 24 hrs OR   -A persistent decrease in oxygen saturation of greater than or equal to 5% lasting   at least four or more hours and occurring within the last 24 hours  []  Renal - Does the patient have:   -low urine output (e.g. Less than 0.63mL/kg/HR for one hour despite adequate fluid    resuscitation, OR   -Increased creatinine (greater than 50% increase from baseline) OR   -require acute dialysis?  []  Hematologic - Does the patient have a:   -Low platelet count (less than 100,000 mm3)   Recent Labs  Lab 10/01/13  1551   PLATELETS 238   OR   -INR/aPTT greater than upper limit of normal?No results for input(s): INR in the last 168 hours. or                No results for input(s): APTT in the last 168 hours.  []  Metabolic - Does the patient have a high lactate (plasma lactate greater than or equal to 2.4 mMol/L)? No results for input(s): LACTATE in the last 168 hours.    []  Hepatic - Are the patient's liver enzymes elevated (ALT greater than 72 IU/L or Total      Bilirubin greater than 2 MG/dL)?    Recent Labs  Lab 10/01/13  1551   BILIRUBIN, TOTAL 0.6   ALT 18     []  CNS - Does the patient have altered consciousness or reduced Glasgow Coma      Scale?    C.  Did you check any of the  boxes above?     []  No, Sepsis Screen Negative   []  YES:  A) Infection + B) SIRS + C) Organ Dysfunction = Positive Screen for Severe Sepsis     Notify MD and document in Complex Assessment under provider notification   - Name of physician notified:                                         - Date/Time Notifiied:                                       Document actions: Following must be completed within 1 hour of positive sepsis screen   []  Lactate drawn (if initial lactate > , repeat lactate in 2 hours for goal decrease 10-20%)   []  Blood Cultures obtained (prior to antibiotic administration; if not done within the last 48 hours)   []  Antibiotics initiated or modified    []   IV Fluid administered 0.9% NS ___1000_______ mLs given (Initial Bolus of 30 ml/kg if SBP < 90 or MAP < 65 or lactate greater than 4 mmol/dl)  Nursing Comments?:     _________________________________________________________________    Patients meeting the following criteria are excluded from screening (check if applicable):   []  [place] hypothermia protocol   []  Comfort Care orders   []  Surgery- No screening for 24 hours after surgery (48 hours after CV surgery)       -If Yes. Date of surgery:______________________   []  Admitted with sepsis and until 72 hours after admission with documented sepsis (RESUME SEPSIS SCREEN AFTER 72 hour window!!!)      -If Yes, Date of Documented Sepsis:______________________   []  Positive screen AND Completed sepsis bundle during previous  24 hours       -If Yes, Date/Time of positive severe sepsis screen:_______________________

## 2013-10-04 NOTE — Procedures (Signed)
ASA      Indication(s):   ( ) Failed stress test  x Chest pain / Angina  ( ) Abnormal EKG  ( ) Known CAD  ( ) Heart Failure / post Heart Transplant  ( ) Other:    Review of systems performed:   Yes  (x)         Allergies:     No Known Allergies      Most recent labs:    If available in Epic:     Lab Results   Component Value Date    WBC 4.38 10/01/2013    HGB 12.8 10/01/2013    HCT 38.4 10/01/2013    MCV 93.4 10/01/2013    PLT 238 10/01/2013          Recent Labs  Lab 10/01/13  1551   SODIUM 143   POTASSIUM 4.8   CHLORIDE 108   CO2 25   BUN 9.0   CREATININE 0.9   EGFR >60.0   GLUCOSE 86   CALCIUM 9.5   ALBUMIN 4.0       If unavailable in Epic, present in paper chart.    ASA Physical Status:   (  )  ASA 1  Healthy patient    x  ASA 2  Mild systemic illness    (  )  ASA 3  Systemic disease, though not incapacitating    (  )  ASA 4  Severe systemic disease that is a constant threat to life     (  )  ASA 5  Moribund condition, patient unexpected to live >24 hours, irrespective of    procedure    (  )  E       Emergent procedure    Planned sedation:   (  ) No sedation  (x) Moderate sedation  (  ) Deep sedation (with Anesthesiology present)      Conclusion:     This patient has been seen and examined immediately prior to the procedure, and I feel that they are an appropriate candidate for the planned procedure with the planned sedation.    The risks, benefits, and alternatives to the planned procedure and sedation have been explained to the patient or the patient's guardian.     The currently available history & physical has been reviewed, and there are no major changes.     Exceptions only in the case of emergency.         Fortino Sic, MD

## 2013-10-04 NOTE — Consults (Addendum)
PRELIMINARY                    [facility]                            SURGERY CONSULTATION     Patient Name:           [name], [name] Date:             10/04/2013              Age      53  Stephen MRN:              [id number]                Gender:  Female   Account #:        [id number]            Race:    Black  Date of Birth:          06/20/1960  Scheduled Surgeon:      Vinny Taranto,M.D.  Consult Performed  By:  Sherron Flemings [facility]:               [facility]  Assessment Location:    Patient  Room     Referring Physician(s):  Rajan,Narian,M.D.     History of Present  Illness:  Pt Kirsten, Morton 53  y/o female presented  to [facility] c/c right  arm numbness  and right facial  numbness and sharp  chest pain; centralized,  radiating to her  back, . Patient  notes fatigue and  nausea but denies  shortness of breath,  fevers, rash,  urinary symptoms,  vomiting.  Pt  had negative MRI  brain for any acute  lesions. MRA  head/neck also  wnl. Pt did have  right arm numbness  and right facial  numbness 2  months ago, neuro  studies were negative  too.  Pt had  HA associated  to nitroglycerine  administration.  CP was release with  nitroglycerine.  Troponin < 0.01.  Pt is now CP free.      Pt has family h/o  premature CAD,  her mother has had  MI at 28 y/o.  Pt has pituitary  tumor; benign prolactinoma,  seasonal  allergies and  anxiety . As outpt  pt went for Virtual  Physical, and  cardiac calcium  scoring was reported  at 629, high.  Patient had an  EKG done at the  office of cardiology,  which reveals  incomplete right  bundle branch  block. Blood pressure  was 142/100.  BP now is well  controlled.  Cardiac cath done  today by Dr. 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 This lesion  involves the  ostium of the circumflex,  which is  a very large, dominant  vessel, probably  90% and the  proximal LAD of  70% to 80%. Cardiac  surgeon consulted   for re vascularization  with  CABG.     STS risk scores:  risk of mortality:  0.376%, morbidity  or mortality  7.215%.              Past Medical History  Alcohol Consumption:  <= 1 drink/week        Past Surgical  History:  The patient has  a right toe bone  spur removal,                       fibroid surgery  and                       appendectomy.  Family History:                       Mother had coronary  artery disease  and had                       myocardial infarction  at age 41                       and also had  hypertension. No  history of stroke,                       diabetes or cancer  in the                       family.  Social History:                       She denies tobacco.  Occasional  alcohol intake.                       No illicit drug  use.  Radiology and Test  Review:              Cardiac cath:  10/04/2013 by Dr.  Glean Hess                       PRECATHETERIZATION  DIAGNOSIS:                       A 53 year old  female who presents  for cardiac                       evaluation with  substernal                       chest pain, right-sided.                          POSTCATHETERIZATION  DIAGNOSES:                       High-grade left  main stenosis  of 60% to 70%,                       ostial large,  dominant                       circumflex of  90%, proximal left  anterior                       descending of  80%, small right                       coronary artery,  normal left ventricular                       systolic function,  ejection  fraction 60%.                          TITLE OF PROCEDURES:                       Left heart catheterization,  left  and right                       coronary angiography,  left                       ventricular angiography,  transradial  approach.                          BRIEF HISTORY:                       The patient is  a 53 year old female  who                       initially presented  with atypical                        symptoms of substernal  right-sided  chest pain                       and neurologic  findings. She                       ended up having  recurrent angina  and cardiac                       catheterization  was                       recommended.  It was unable to  be done on Friday                       due to technical  reasons                       and she waited  over the weekend.  HEMODYNAMICS:  Blood pressure was  130/70, LVEDP  was 10 mmHg,  heart rate was 60  beats a  minute.     DESCRIPTION OF PROCEDURE:  After informed consent  was obtained,  the right  radial site was  utilized. A  5-French sheath  was placed and IV  heparin 3000  units was given  intravenously. Intraarterial  nitroglycerin  and  verapamil was used  in 200  mcg and 2.5 mg infections.  The patient  tolerated  the procedure well  without any complications.  Then LV  angiography  was performed using  a  pigtail catheter  advanced across  the aortic  valve at 12 mL,  total volume of  25 mL. A pullback  was recorded across  the aortic  valve. The patient  tolerated the procedure  well without  any  difficulties. A  TR Band was  placed in the right  radial site without  any  complications using  10 mL of  air.     CORONARY ANGIOGRAPHY:  Left coronary angiography  demonstrates  a left  main with a tapering  of  approximately 60%  of the distal left  main. This  lesion involves  the ostium  of the circumflex,  which is a very  large,  dominant vessel,  probably 90% and  the proximal LAD  of 70% to 80%. The  LAD runs  across the anterior  wall,  giving rise to multiple  septal perforators,  small diagonal branches,  there  is a wrap around  LAD. The circumflex  marginal  branch trifurcates  into 3 to  4 moderate size  obtuse marginal branch,  this is  a left dominant  system.  The ostium does  have a 90% stenosis.  There are  good graftable site  throughout the LAD  and the circumflex  marginal  branch.  The right coronary  artery is a small   vessel  without any significant  flow to  a small PDA.     LEFT VENTRICULAR  ANGIOGRAPHY:  Demonstrates normal  LV symmetric  contractility,  EF 60%.     CONCLUSIONS:  A 53 year old female  who presents  with atypical  chest pain, positive  family  history of coronary  artery disease  and angina.  The patient has  significant  CAD noted that is  treatable surgery  with a LIMA  to the LAD and a  vein graft  to the circumflex  marginal branch.  I have  discussed it with  the patient,  the husband, and  Dr. Lovina Reach, and  we will be  proceeding over  the next 24 to  48 hours. In the  meantime, she will  be on  aspirin, statin,  beta blockers,  subcutaneous heparin,  and nitrates.  The patient  will follow up with  Dr.  Lovina Reach upon discharge.        2D echo 10/03/2013  SUMMARY:  - Left ventricle:  - Systolic function  was normal. Ejection  fraction was estimated  in the range  of 55 % to 65 %.  - Wall thickness  was mildly increased.  - Left ventricular  diastolic function  parameters  were normal.     - Pulmonary arteries:  - Systolic pressure  was within the  normal range.     FINDINGS:     LEFT VENTRICLE:  Size was normal.  Systolic  function was normal.  Ejection  fraction was estimated  in the range  of 55 % to  65 %. There were  no regional  wall motion abnormalities.  Wall thickness  was  mildly increased.  Doppler: Left  ventricular diastolic  function parameters  were  normal.     VENTRICULAR SEPTUM:  No ventricular  shunt was  identified.     RIGHT VENTRICLE:  The size was normal.  Systolic  function was normal.  Wall  thickness was normal.     LEFT ATRIUM: Size  was normal.     ATRIAL SEPTUM: There  was no left-to-right  shunt  and no right-to-left  shunt.     RIGHT ATRIUM: Size  was normal.     AORTIC VALVE: The  valve was trileaflet.  Leaflets  exhibited normal  thickness and  normal cuspal separation.  Doppler:  Transaortic  velocity was within  the normal  range. There was  no stenosis. There  was  no  regurgitation.     MITRAL VALVE: Valve  structure was  normal. There  was normal leaflet  separation.  No echocardiographic  evidence for  prolapse.  Doppler: The transmitral  velocity  was within the normal  range. There  was no  evidence for stenosis.  There was  no  regurgitation.     TRICUSPID VALVE:  The valve structure  was normal.  There was normal  leaflet  separation. Doppler:  The transtricuspid  velocity  was within the normal  range.  There was no evidence  for tricuspid  stenosis.  There was mild regurgitation.     PULMONIC VALVE:  Leaflets exhibited  normal  thickness, no calcification,  and  normal cuspal separation.  Doppler:  The  transpulmonic velocity  was within  the  normal range. There  was no regurgitation.     AORTA: The root  exhibited normal  size.  PULMONARY ARTERY:  The size was normal.  Doppler:  Systolic pressure  was within  the normal range.  SYSTEMIC VEINS: IVC:  The inferior  vena cava was  normal in size.  PERICARDIUM: There  was no pericardial  effusion.        MRA of the neck  CLINICAL HISTORY:  White matter disease  right  facial and arm numbness.  Comments: MRA of  the neck was performed  with  axial 2D time of  flight MRA  imaging and coronal  3-D gadolinium  enhanced MRA  imaging. The study  used  10 cc of gadavist.     The common carotid  arteries, carotid  bifurcations, and  internal carotid  arteries are widely  patent bilaterally.  The  vertebral arteries  are  widely patent as  well. There is no  stenosis.     IMPRESSION:  Normal neck MRA.  Laurena Slimmer, MD  10/04/2013 9:51 AM     Head MRA  3D time-of-flight  intracranial MRA  was  performed, which  resulted in good  visualization of  the upper cervical,  petrous,  cavernous, and  supraclinoid portions  of both carotid  arteries,  as well as the basilar  artery. Anterior,  posterior and middle  cerebral  arteries are visualized  to their second order  of branching,  and no  evidence of stenosis  or  obvious occlusion   is seen.     IMPRESSION:  Negative intracranial  MRA.  Laurena Slimmer, MD  10/04/2013 9:49 AM        MRI BRAIN WO CONTRAST:10/04/2013  7:43  AM  CLINICAL HISTORY:  .arm /face numbness     COMPARISON: None.     FINDINGS:  Extra axial spaces:  Normal in size  and  morphology for the  patient's age.  Hemorrhage: None.  Ventricular system:  Normal in size  and  morphology for the  patient's age.  Basal cisterns: Normal.  Cerebral parenchyma:  There are several  punctate  regions of high T2  and  FLAIR signal intensity  seen in the  subcortical  white matter of the  frontal lobes bilaterally  without  underlying  diffusion restriction  or  mass effect or edema.  Midline shift: None.  Cerebellum: Normal.  Brainstem: Normal.     OTHER:  Calvarium: Unremarkable.  Incidental  note is made  of multiple regions  of  susceptibility overlying  the scalp  which may be  in the patient's  hair or  due to multiple scalp  calcifications  Vascular system:  Normal.  Visualized Paranasal  sinuses: Clear.  Visualized Orbits:  Normal.  Visualized upper  cervical spine: Normal.  Sella and skull base:  Normal.     IMPRESSION:     Minimal nonspecific  bilateral frontal  white  matter changes without  mass  effect or edema or  acute infarct.  Otherwise  unremarkable  Laurena Slimmer, MD  10/04/2013 9:48 AM     CBC - WBC: 4.31;  HB: 12.8; Hct: 38.4;  PLT: 238;  BMP - NA: 143;     K: 4.8;    BUN:  9;  Creatinine: 0.9;  Glucose: 86  Lipids - Total Cholesterol:  200; Triglycerides:                       75; HDL: 44;         LDL: 141;      VLDL: ;                       Alk Phos: ;   ALT: ;    AST:                       TP: #1 <0.01;  #2 <0.01; #3: ;  #4: ; #5:                       CK: #1 ; #2 ;  #3: ; #4: ; #5:                       PT: ;  PTT: ;  INR: ; HbA1c: ;           ALLERGY                  DESCRIPTION  ALLERGIES NOT ON  FILE  NO KNOWN ALLERGIES       SYSTEMIC        Home Medications:     MEDICATION     DOSE   UNIT     ROUTE      FREQUENCY     COMMENT  Advair         250/50 one      IH         BID  flonase        2      spay     daily      PRN  paxil          30     mg       PO         Daily  vit D          50,000 units    PO         once  a                                            1x/week        Hospital Medications:     MEDICATION     DOSE   UNIT     ROUTE      FREQUENCY    COMMENT  Lopressor      12.5   mg       PO         q 12  Aspirin        324    mg       PO  Daily  Lipitor        40     mg       PO         Q HS  paxil          30     mg       PO         Daily  Imdur          30     mg       PO         Daily  flonase        2      spary               Daily  Advair         250/50 mg       IH         BID  vit D          50,000 units    PO         weekly           Physical Examination     HR:     56   BP      115/7BP            RR: 20   O2     95 % Temp: 97.9               Right:  5     Left:                 Sat:  Weight: 178.99   81.17  kgHeight:  65 in  165.1  cm          lbs        Review of Systems:  Respiratory  Denies dyspnea,  wheezing, cough,  hemoptysis,  asthma or               emphysema.  CV           Denies heart  murmur, palpitations,  orthopnea,  paroxysmal               nocturnal dyspnea  or edema. Positive  for CP,  fatigue  Abdomen      Denies indigestion,   vomiting,  abdominal pain,  dark or               bloody stools,  any change in  bowel habits, jaundice  or               hepatitis. Positive  for nauseas  Extremities  Denies muscle  weakness, arthralgias,  arthritis  or gout.  NeurologicalDenies  Hx of  seizures, strokes,  TIA's,  and weakness.               Positive for  HA. Positive for  R+ arm and R+ face               numbness     Physical Exam  General:        Well-developed,  well-nourished,  in no  acute distress.  Skin:           Warm  and dry.  Heart:          S1  S2, Regular rhythm,  No murmurs, Rubs  or Gallops.  Lungs:          Lungs  clear to auscultation,  no wheezes,  rales or                   rhonchi.  Neck:           No  JVD and no carotid  bruits.  Abdomen:        Bowel  sounds present  in all four quadrants.  Soft and                  non-tender.  No masses.  Extremities:    Warm.  No edema. Palpable  pulses in all  four                  extremities.  No varicosities     Diagnosis:  1. L+ main CAD  2. Family h/o premature  CAD  3. Unstable angina  4. Anxiety  5. HTN  6. R+ arm and R+  face numbness        Treatment Plan:  1. Cont. beta blocker  2. Holding amiodarone  due to SB  3. Pre op lab work  4. Pre op teaching  for CABG  5. Monitoring renal  function  6. No ACE I     Pt seen and evaluated  for Dr. Tamsen Meek,  her husband and  mother at the  bedside. Consents  obtained for CABG  in AM for CABG.  Clovis Cao,  NP    electronically  signed on 10/04/2013  10:28:53  PM with status of  Approved    Patient seen and examined. Agree with the above assessment and plan.  I have discussed CABG in detail with the patient and her family.  I have discussed her STS risk scores as well.  She wants to proceed with CABG with LIMA to the LAD and SVG to the Obtuse marginal.  All questions were answered when consents obtained yesterday.  Tanda Rockers MD

## 2013-10-04 NOTE — Procedures (Signed)
Left Heart Catheterization      Cardiac Catheterization Report    CLORINDA WYBLE  53 y.o.  female    PreOpDiagnosis:   1. Chest pain    PostOp Diagnosis:  1. L main disease 60% at trifurcation with ulcer  Nl RCA    Procedure:  LHC, cor angio, LV gram    Plan:  Will refer pt for CABG with grafting to LAD, circ. Will discuss with Dr Lovina Reach.            Fortino Sic, MD  3:08 PM 10/04/2013

## 2013-10-04 NOTE — Progress Notes (Signed)
Progress Note    Date Time: 10/04/2013 10:38 AM  Patient Name: [name]  Attending Physician: Christophe Louis, DO      Assessment & Plan:   53 yo female with hx prolactinoma, recurrent R face/arm numbness - negative MRI brain for any acute lesions.  MRA head/neck also wnl.    Had migraine headache last night - now resolved.    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    -  Avoid triptans or other vasoactive meds in light of ongoing cardiac work up.  -  OK to use caffeine, Exedrine PRN headache  -  Pt to proceed with cardiac work up    Subjective:   Patient Seen and Examined. The notes from the last 24 hours were reviewed.   Pt feels well at this time - sensory symptoms resolved.  Headache resolved.    Review of Systems:   No headache, eye, ear nose, throat problems; no coughing or wheezing or shortness of breath, No chest pain or orthopnea, no abdominal pain, nausea or vomiting, No pain in the body or extremities, no psychiatric, neurological, endocrine, hematological or cardiac complaints except as noted above.     Physical Exam:   Blood pressure 116/82, pulse 56, temperature 98.1 F (36.7 C), temperature source Oral, resp. rate 16, height 1.651 m (5\' 5" ), weight 81.194 kg (179 lb), SpO2 100 %.    Alert  Appropriate  Face symmetric  No UE drift    Meds:      Scheduled Meds: PRN Meds:      aspirin 324 mg Oral Daily   aspirin EC 81 mg Oral Daily   atorvastatin 20 mg Oral QHS   fluticasone 2 spray Each Nare Daily   fluticasone-salmeterol 1 puff Inhalation BID   isosorbide mononitrate 30 mg Oral Daily   NON-FORMULARY order form 0.25 mg Oral See Admin Instructions   PARoxetine 30 mg Oral QAM   sodium chloride (PF) 3 mL Intravenous Q8H   [START ON 10/07/2013] Vitamin D (Ergocalciferol) 50,000 Units Oral Weekly       Continuous Infusions:      acetaminophen-codeine 1 tablet Q6H PRN           I personally reviewed all of the  medications    Labs:     Recent Labs  Lab 10/01/13  1551   GLUCOSE 86   BUN 9.0   CREATININE 0.9   CALCIUM 9.5   SODIUM 143   POTASSIUM 4.8   CHLORIDE 108   CO2 25   ALBUMIN 4.0   AST (SGOT) 26   ALT 18   BILIRUBIN, TOTAL 0.6   ALKALINE PHOSPHATASE 74       Recent Labs  Lab 10/01/13  1551   WBC 4.38   HEMOGLOBIN 12.8   HEMATOCRIT 38.4   MCV 93.4   MCH, POC 31.1   MCHC 33.3   PLATELETS 238         No results for input(s): PTT, PT, INR in the last 72 hours.       Radiology Results (24 Hour)     Procedure Component Value Units Date/Time    MR Angiogram Neck W WO Contrast [[id number]] Collected:  10/04/13 0950    Order Status:  Completed Updated:  10/04/13 0955    Narrative:      CLINICAL HISTORY: White matter disease right facial and arm numbness.    Comments: MRA of the neck was  performed with axial 2D time of flight MRA  imaging and coronal 3-D gadolinium enhanced MRA imaging. The study used  10 cc of gadavist.    The common carotid arteries, carotid bifurcations, and internal carotid  arteries are widely patent bilaterally. The vertebral arteries are  widely patent as well. There is no stenosis.      Impression:       Normal neck MRA.    Laurena Slimmer, MD   10/04/2013 9:51 AM      MR Angiogram Head WO Contrast [[id number]] Collected:  10/04/13 0949    Order Status:  Completed Updated:  10/04/13 0953    Narrative:      History: Right face and arm numbness     3D time-of-flight intracranial MRA was performed, which resulted in good  visualization of the upper cervical, petrous, cavernous, and  supraclinoid portions of both carotid arteries, as well as the basilar  artery.  Anterior, posterior and middle cerebral arteries are visualized  to their second order of branching, and no evidence of stenosis or  obvious occlusion is seen.      Impression:       Negative intracranial MRA.        Laurena Slimmer, MD   10/04/2013 9:49 AM      MRI Brain WO Contrast [[id number]] Collected:  10/04/13 0944    Order Status:  Completed Updated:   10/04/13 0953    Narrative:      MRI BRAIN WO CONTRAST:10/04/2013 7:43 AM     CLINICAL HISTORY:    .arm /face numbness    TECHNIQUE: Noncontrast Brain MR    CONTRAST: None    COMPARISON: None.    FINDINGS:     Extra axial spaces: Normal in size and morphology for the patient's age.  Hemorrhage: None.  Ventricular system: Normal in size and morphology for the patient's age.  Basal cisterns: Normal.  Cerebral parenchyma: There are several punctate regions of high T2 and  FLAIR signal intensity seen in the subcortical white matter of the  frontal lobes bilaterally without underlying diffusion restriction or  mass effect or edema.  Midline shift: None.  Cerebellum: Normal.  Brainstem: Normal.    OTHER:    Calvarium: Unremarkable. Incidental note is made of multiple regions of  susceptibility overlying the scalp which may be in the patient's hair or  due to multiple scalp calcifications  Vascular system: Normal.  Visualized Paranasal sinuses: Clear.  Visualized Orbits: Normal.  Visualized upper cervical spine: Normal.  Sella and skull base: Normal.      Impression:          Minimal nonspecific bilateral frontal white matter changes without mass  effect or edema or acute infarct. Otherwise unremarkable    Laurena Slimmer, MD   10/04/2013 9:48 AM             All recent brain and spine imaging (MRI, CT) personally reviewed.    Chart reviewed    Case discussed with: patient and family.    20 minutes; >50% time spent in counseling or coordination of care    Signed by: Ardelle Anton, MD  Spectralink: [phone]       Answering Service: [phone]

## 2013-10-04 NOTE — Progress Notes (Signed)
INTERNAL MEDICINE PROGRESS NOTE    Date: 10/04/2013  Patient Name:[name],[name] 53 y.o. female admitted with Chest pain of uncertain etiology  Patient status: Inpatient  Hospital Day: 3     Assessment:    Unstable angina- cardiac enzymes 3 negative.   Elevated blood pressure pressure stable now   Bradycardia.   Elevated coronary artery calcium score    Right arm pain- MRI/MRA negative   Facial numbness   Anxiety.   Hyperlipidemia    Plan:    Appreciate neurology input   Plan for cardiac cath today   Continue to monitor on telemetry   Continue other management   Discharge plan once cleared by cardiology/neurology    Plan of care explained to patient and RN    Subjective:   Denies chest pain or right arm pain     Hospital problems:  Principal Problem:    Chest pain of uncertain etiology  Active Problems:    Elevated BP    Anxiety    Elevated coronary artery calcium score    Right arm pain      Medications:      Scheduled Meds: PRN Meds:        aspirin 324 mg Oral Daily   atorvastatin 40 mg Oral QHS   fluticasone 2 spray Each Nare Daily   fluticasone-salmeterol 1 puff Inhalation BID   heparin (porcine) 5,000 Units Subcutaneous Q12H SCH   isosorbide mononitrate 30 mg Oral Daily   metoprolol 25 mg Oral BID   NON-FORMULARY order form 0.25 mg Oral See Admin Instructions   PARoxetine 30 mg Oral QAM   sodium chloride (PF) 3 mL Intravenous Q8H   [START ON 10/07/2013] Vitamin D (Ergocalciferol) 50,000 Units Oral Weekly       Continuous Infusions:      acetaminophen 650 mg Q4H PRN   acetaminophen-codeine 1 tablet Q6H PRN           Review of Systems:   General:  Fever/chills: Absent  HEENT: Negative for Headache,blurred vision,sore throat  Respiratory: Cough/ SOB- Absent  Cardiac: Chest pain +ve  Gastrointestinal: Nausea/vomiting/diarrhea/constipation-Absent, Abdominal pain- absent  Genitourinary: No burning micturition.   Musculoskeletal:Negative for - joint pain, muscular weakness or swelling   CNS/Neurological:No  headache,weakness,blurred vision     Physical Exam:   BP 115/75 mmHg  Pulse 52  Temp(Src) 97.9 F (36.6 C) (Oral)  Resp 16  Ht 1.651 m (5\' 5" )  Wt 81.194 kg (179 lb)  BMI 29.79 kg/m2  SpO2 100%  LMP  (LMP Unknown)    General appearance - alert,and in no distress  HEENT: NC/AT, PERL b/l, nares clear, normal hearing, o/p clear  Neck - supple  Chest - clear to auscultation  Heart - normal S1 and S2 and regular rhythm  Abdomen - bowel sounds +ve, soft, non distended  Extremities - no pedal edema  Neurological - Alert    Labs:       Recent Labs  Lab 10/01/13  1551   WBC 4.38   HEMOGLOBIN 12.8   HEMATOCRIT 38.4   PLATELETS 238   MCV 93.4   NEUTROPHILS 41       Recent Labs  Lab 10/01/13  1551   SODIUM 143   POTASSIUM 4.8   CHLORIDE 108   CO2 25   BUN 9.0   CREATININE 0.9   GLUCOSE 86   CALCIUM 9.5   PROTEIN, TOTAL 7.1   ALBUMIN 4.0   AST (SGOT) 26   ALT 18  ALKALINE PHOSPHATASE 74   BILIRUBIN, TOTAL 0.6     Glucose:      Recent Labs  Lab 10/01/13  1551   GLUCOSE 86       Recent Labs  Lab 10/02/13  1046 10/02/13  0404 10/01/13  2341   TROPONIN I <0.01 <0.01 <0.01       Rads:   Mr Angiogram Head Wo Contrast    10/04/2013    Negative intracranial MRA.    Laurena Slimmer, MD  10/04/2013 9:49 AM     Mr Angiogram Neck W Wo Contrast    10/04/2013    Normal neck MRA.  Laurena Slimmer, MD  10/04/2013 9:51 AM     Mri Brain Wo Contrast    10/04/2013     Minimal nonspecific bilateral frontal white matter changes without mass effect or edema or acute infarct. Otherwise unremarkable  Laurena Slimmer, MD  10/04/2013 9:48 AM       Christophe Louis, DO  Internal Medicine  10/04/2013

## 2013-10-04 NOTE — Treatment Plan (Signed)
Severe Sepsis Screen  Date: 10/04/2013 Time: 3:20 AM  Nurse Signature: Epimenio Sarin. Infection:    Does your patient have ONE or more of the following infection criteria?     []  Documented Infection - Does the patient have positive culture results (from blood,        sputum, urine, etc)?   []  Anti-Infective Therapy - Is the patient receiving antibiotic, antifungal, or other                anti-infective therapy?   []  Pneumonia - Is there documentation of pneumonia (X Ray, etc)?   []  WBC's - Have WBC's been found in normally sterile fluid (urine, CSF, etc.)?   []  Perforated Viscus -Does the patient have a perforated hollow organ (bowel)?    A.  Did you check any of the boxes above?    [x]  No  If No, Stop Here and Sepsis Screen Negative.               []  Yes, continue to section B      B. SIRS:     Does your patient have TWO or more of the following SIRS criteria (ensure vital signs & temperature are within 1 hour of this screening)?    []  Temperature - Is the patient's temperature: Temp: 98 F (36.7 C) (10/04/13 0100)   - Greater than or equal to 38.3 degrees C (greater than 100.9 degrees F)?   - Less than or equal to 36 degrees C (less than or equal to 96.8 degrees F)?    []  Heart Rate: Heart Rate: (!) 51 (10/04/13 0100)   - Is the patient's heart rate greater than or equal to 90 bpm?    []  Respiratory: Resp Rate: 18 (10/04/13 0100)   - Is the patient's respiratory rate greater than or equal to 20?    []  WBC Count - Is the patient's WBC count:   Recent Labs  Lab 10/01/13  1551   WBC 4.38      - Greater than or equal to 12,000/mm3 OR   - Less than or equal to 4,000/mm3 OR    - Are there greater than 10% immature neutrophils (bands)?    []  Glucose >140 without diabetes?   Recent Labs  Lab 10/01/13  1551   GLUCOSE 86       []  Significant edema?    B.  Did you check two or more of the boxes above?     []  No, Stop Here and Sepsis Screen Negative   []  Yes, continue to section C    C.  Acute Organ Dysfunction      Does your patient have ONE or more of the following organ dysfunction? (May need to wait for lab results for assessment - see below) Organ dysfunction must be a result of the sepsis not chronic conditions.    []  Cardiovascular - Does the patient have a: BP: 129/81 mmHg (10/04/13 0100)   -systolic Blood pressure less than or equal to 90 mmHg OR   -systolic blood pressure has dropped 40 mmHg or more from baseline OR   -mean arterial pressure less than or equal to 70 mmHg (for at least one hour   despite fluid resuscitation OR require vasopressor support?  []  Respiratory - Does the patient have new hypoxia defined by any of the following"   -A sustained increase in oxygen requirements by at least 2L/min on NC or 28%    FiO2  within the last 24 hrs OR   -A persistent decrease in oxygen saturation of greater than or equal to 5% lasting   at least four or more hours and occurring within the last 24 hours  []  Renal - Does the patient have:   -low urine output (e.g. Less than 0.57mL/kg/HR for one hour despite adequate fluid    resuscitation, OR   -Increased creatinine (greater than 50% increase from baseline) OR   -require acute dialysis?  []  Hematologic - Does the patient have a:   -Low platelet count (less than 100,000 mm3)   Recent Labs  Lab 10/01/13  1551   PLATELETS 238   OR   -INR/aPTT greater than upper limit of normal?No results for input(s): INR in the last 168 hours. or                No results for input(s): APTT in the last 168 hours.  []  Metabolic - Does the patient have a high lactate (plasma lactate greater than or equal to 2.4 mMol/L)? No results for input(s): LACTATE in the last 168 hours.    []  Hepatic - Are the patient's liver enzymes elevated (ALT greater than 72 IU/L or Total      Bilirubin greater than 2 MG/dL)?    Recent Labs  Lab 10/01/13  1551   BILIRUBIN, TOTAL 0.6   ALT 18     []  CNS - Does the patient have altered consciousness or reduced Glasgow Coma      Scale?    C.  Did you check any of the  boxes above?     []  No, Sepsis Screen Negative   []  YES:  A) Infection + B) SIRS + C) Organ Dysfunction = Positive Screen for Severe Sepsis     Notify MD and document in Complex Assessment under provider notification   - Name of physician notified:                                         - Date/Time Notifiied:                                       Document actions: Following must be completed within 1 hour of positive sepsis screen   []  Lactate drawn (if initial lactate > , repeat lactate in 2 hours for goal decrease 10-20%)   []  Blood Cultures obtained (prior to antibiotic administration; if not done within the last 48 hours)   []  Antibiotics initiated or modified    []   IV Fluid administered 0.9% NS ___1000_______ mLs given (Initial Bolus of 30 ml/kg if SBP < 90 or MAP < 65 or lactate greater than 4 mmol/dl)  Nursing Comments?:     _________________________________________________________________    Patients meeting the following criteria are excluded from screening (check if applicable):   []  [place] hypothermia protocol   []  Comfort Care orders   []  Surgery- No screening for 24 hours after surgery (48 hours after CV surgery)       -If Yes. Date of surgery:______________________   []  Admitted with sepsis and until 72 hours after admission with documented sepsis (RESUME SEPSIS SCREEN AFTER 72 hour window!!!)      -If Yes, Date of Documented Sepsis:______________________   []  Positive screen AND Completed sepsis bundle during  previous 24 hours       -If Yes, Date/Time of positive severe sepsis screen:_______________________

## 2013-10-04 NOTE — Plan of Care (Signed)
Problem: Pain  Goal: Patient's pain/discomfort is manageable  Outcome: Progressing  Pt complained of severe HA, migraine. Pt refused midnight vitals. Tylenol 3 given. Lights turned off. Quiet environment provided. Will continue to monitor.     Problem: Chest Pain  Goal: Vital signs and cardiac rhythm stable  Outcome: Progressing  Pt complains of mild chest pain. Nitro paste is on. Vital signs are stable. Pt denies shortness of breath. Resting comfortably, no distress. Cardiac diet. NPO since Midnight for cardiac cath in AM.

## 2013-10-04 NOTE — Progress Notes (Signed)
Severe Sepsis Screen  Date: 10/04/2013 Time: 8:57 AM  Nurse Signature: Tressia Danas Kadiatou Oplinger    A. Infection:    Does your patient have ONE or more of the following infection criteria?     []  Documented Infection - Does the patient have positive culture results (from blood,        sputum, urine, etc)?   []  Anti-Infective Therapy - Is the patient receiving antibiotic, antifungal, or other                anti-infective therapy?   []  Pneumonia - Is there documentation of pneumonia (X Ray, etc)?   []  WBC's - Have WBC's been found in normally sterile fluid (urine, CSF, etc.)?   []  Perforated Viscus -Does the patient have a perforated hollow organ (bowel)?    A.  Did you check any of the boxes above?    [x]  No  If No, Stop Here and Sepsis Screen Negative.               []  Yes, continue to section B      B. SIRS:     Does your patient have TWO or more of the following SIRS criteria (ensure vital signs & temperature are within 1 hour of this screening)?    []  Temperature - Is the patient's temperature: Temp: 98.1 F (36.7 C) (10/04/13 0847)   - Greater than or equal to 38.3 degrees C (greater than 100.9 degrees F)?   - Less than or equal to 36 degrees C (less than or equal to 96.8 degrees F)?    []  Heart Rate: Heart Rate: (!) 56 (10/04/13 0847)   - Is the patient's heart rate greater than or equal to 90 bpm?    []  Respiratory: Resp Rate: 16 (10/04/13 0847)   - Is the patient's respiratory rate greater than or equal to 20?    []  WBC Count - Is the patient's WBC count:   Recent Labs  Lab 10/01/13  1551   WBC 4.38      - Greater than or equal to 12,000/mm3 OR   - Less than or equal to 4,000/mm3 OR    - Are there greater than 10% immature neutrophils (bands)?    []  Glucose >140 without diabetes?   Recent Labs  Lab 10/01/13  1551   GLUCOSE 86       []  Significant edema?    B.  Did you check two or more of the boxes above?     []  No, Stop Here and Sepsis Screen Negative   []  Yes, continue to section C    C.  Acute Organ Dysfunction      Does your patient have ONE or more of the following organ dysfunction? (May need to wait for lab results for assessment - see below) Organ dysfunction must be a result of the sepsis not chronic conditions.    []  Cardiovascular - Does the patient have a: BP: 116/82 mmHg (10/04/13 0847)   -systolic Blood pressure less than or equal to 90 mmHg OR   -systolic blood pressure has dropped 40 mmHg or more from baseline OR   -mean arterial pressure less than or equal to 70 mmHg (for at least one hour   despite fluid resuscitation OR require vasopressor support?  []  Respiratory - Does the patient have new hypoxia defined by any of the following"   -A sustained increase in oxygen requirements by at least 2L/min on NC or 28%    FiO2 within  the last 24 hrs OR   -A persistent decrease in oxygen saturation of greater than or equal to 5% lasting   at least four or more hours and occurring within the last 24 hours  []  Renal - Does the patient have:   -low urine output (e.g. Less than 0.34mL/kg/HR for one hour despite adequate fluid    resuscitation, OR   -Increased creatinine (greater than 50% increase from baseline) OR   -require acute dialysis?  []  Hematologic - Does the patient have a:   -Low platelet count (less than 100,000 mm3)   Recent Labs  Lab 10/01/13  1551   PLATELETS 238   OR   -INR/aPTT greater than upper limit of normal?No results for input(s): INR in the last 168 hours. or                No results for input(s): APTT in the last 168 hours.  []  Metabolic - Does the patient have a high lactate (plasma lactate greater than or equal to 2.4 mMol/L)? No results for input(s): LACTATE in the last 168 hours.    []  Hepatic - Are the patient's liver enzymes elevated (ALT greater than 72 IU/L or Total      Bilirubin greater than 2 MG/dL)?    Recent Labs  Lab 10/01/13  1551   BILIRUBIN, TOTAL 0.6   ALT 18     []  CNS - Does the patient have altered consciousness or reduced Glasgow Coma      Scale?    C.  Did you check any of the  boxes above?     []  No, Sepsis Screen Negative   []  YES:  A) Infection + B) SIRS + C) Organ Dysfunction = Positive Screen for Severe Sepsis     Notify MD and document in Complex Assessment under provider notification   - Name of physician notified:                                         - Date/Time Notifiied:                                       Document actions: Following must be completed within 1 hour of positive sepsis screen   []  Lactate drawn (if initial lactate > , repeat lactate in 2 hours for goal decrease 10-20%)   []  Blood Cultures obtained (prior to antibiotic administration; if not done within the last 48 hours)   []  Antibiotics initiated or modified    []   IV Fluid administered 0.9% NS ___1000_______ mLs given (Initial Bolus of 30 ml/kg if SBP < 90 or MAP < 65 or lactate greater than 4 mmol/dl)  Nursing Comments?:     _________________________________________________________________    Patients meeting the following criteria are excluded from screening (check if applicable):   []  [place] hypothermia protocol   []  Comfort Care orders   []  Surgery- No screening for 24 hours after surgery (48 hours after CV surgery)       -If Yes. Date of surgery:______________________   []  Admitted with sepsis and until 72 hours after admission with documented sepsis (RESUME SEPSIS SCREEN AFTER 72 hour window!!!)      -If Yes, Date of Documented Sepsis:______________________   []  Positive screen AND Completed sepsis bundle during previous  24 hours       -If Yes, Date/Time of positive severe sepsis screen:_______________________

## 2013-10-05 ENCOUNTER — Encounter
Admission: EM | Disposition: A | Discharge status: Home Health | Payer: BLUE CROSS/BLUE SHIELD | Source: Home / Self Care | Attending: Surgery

## 2013-10-05 ENCOUNTER — Encounter: Payer: Self-pay | Admitting: Acute Care

## 2013-10-05 ENCOUNTER — Inpatient Hospital Stay: Payer: BLUE CROSS/BLUE SHIELD | Admitting: Anesthesiology

## 2013-10-05 ENCOUNTER — Inpatient Hospital Stay: Payer: BLUE CROSS/BLUE SHIELD

## 2013-10-05 DIAGNOSIS — Z951 Presence of aortocoronary bypass graft: Secondary | ICD-10-CM

## 2013-10-05 HISTORY — DX: Presence of aortocoronary bypass graft: Z95.1

## 2013-10-05 LAB — GLUCOSE WHOLE BLOOD - POCT
Whole Blood Glucose POCT: 133 mg/dL — ABNORMAL HIGH (ref 70–100)
Whole Blood Glucose POCT: 129 mg/dL — ABNORMAL HIGH (ref 70–100)
Whole Blood Glucose POCT: 139 mg/dL — ABNORMAL HIGH (ref 70–100)
Whole Blood Glucose POCT: 153 mg/dL — ABNORMAL HIGH (ref 70–100)
Whole Blood Glucose POCT: 149 mg/dL — ABNORMAL HIGH (ref 70–100)
Whole Blood Glucose POCT: 130 mg/dL — ABNORMAL HIGH (ref 70–100)
Whole Blood Glucose POCT: 138 mg/dL — ABNORMAL HIGH (ref 70–100)

## 2013-10-05 LAB — CBC
Hematocrit: 42.3 % (ref 37.0–47.0)
Hematocrit: 34.4 % — ABNORMAL LOW (ref 37.0–47.0)
Hgb: 14.5 g/dL (ref 12.0–16.0)
Hgb: 11.5 g/dL — ABNORMAL LOW (ref 12.0–16.0)
MCH: 32.2 pg — ABNORMAL HIGH (ref 28.0–32.0)
MCH: 31.6 pg (ref 28.0–32.0)
MCHC: 34.3 g/dL (ref 32.0–36.0)
MCHC: 33.4 g/dL (ref 32.0–36.0)
MCV: 93.8 fL (ref 80.0–100.0)
MCV: 94.5 fL (ref 80.0–100.0)
MPV: 11.2 fL (ref 9.4–12.3)
MPV: 10.8 fL (ref 9.4–12.3)
Nucleated RBC: 0 /100 WBC (ref 0–1)
Nucleated RBC: 0 /100 WBC (ref 0–1)
Platelets: 262 10*3/uL (ref 140–400)
Platelets: 143 10*3/uL (ref 140–400)
RBC: 4.51 10*6/uL (ref 4.20–5.40)
RBC: 3.64 10*6/uL — ABNORMAL LOW (ref 4.20–5.40)
RDW: 13 % (ref 12–15)
RDW: 13 % (ref 12–15)
WBC: 5.24 10*3/uL (ref 3.50–10.80)
WBC: 13.22 10*3/uL — ABNORMAL HIGH (ref 3.50–10.80)

## 2013-10-05 LAB — BLOOD GAS, ARTERIAL
Arterial Total CO2: 18.4 mEq/L — ABNORMAL LOW (ref 24.0–30.0)
Arterial Total CO2: 21.4 mEq/L — ABNORMAL LOW (ref 24.0–30.0)
Arterial Total CO2: 18 mEq/L — ABNORMAL LOW (ref 24.0–30.0)
Base Excess, Arterial: -4.3 mEq/L — ABNORMAL LOW (ref <=2.0)
Base Excess, Arterial: -5.3 mEq/L — ABNORMAL LOW (ref <=2.0)
Base Excess, Arterial: -4.5 mEq/L — ABNORMAL LOW (ref <=2.0)
FIO2: 50 %
FIO2: 40 %
FIO2: 40 %
HCO3, Arterial: 20.2 mEq/L — ABNORMAL LOW (ref 23.0–29.0)
HCO3, Arterial: 22.6 mEq/L — ABNORMAL LOW (ref 23.0–29.0)
HCO3, Arterial: 19.7 mEq/L — ABNORMAL LOW (ref 23.0–29.0)
O2 Sat, Arterial: 99.2 % (ref 95.0–100.0)
O2 Sat, Arterial: 96.8 % (ref 95.0–100.0)
O2 Sat, Arterial: 98.8 % (ref 95.0–100.0)
PEEP: 5
PEEP: 5
PEEP: 5
Pressure Support: 10
Pressure Support: 10
Pressure Support: 10
Rate: 10
Temperature: 37
Temperature: 37
Temperature: 37
Tidal vol.: 600
pCO2, Arterial: 37.1 mmhg (ref 35.0–45.0)
pCO2, Arterial: 57.6 mmhg — ABNORMAL HIGH (ref 35.0–45.0)
pCO2, Arterial: 35 mmhg (ref 35.0–45.0)
pH, Arterial: 7.355 (ref 7.350–7.450)
pH, Arterial: 7.218 — ABNORMAL LOW (ref 7.350–7.450)
pH, Arterial: 7.369 (ref 7.350–7.450)
pO2, Arterial: 221 mmhg — ABNORMAL HIGH (ref 80.0–90.0)
pO2, Arterial: 119 mmhg — ABNORMAL HIGH (ref 80.0–90.0)
pO2, Arterial: 154 mmhg — ABNORMAL HIGH (ref 80.0–90.0)

## 2013-10-05 LAB — RED BLOOD CELLS OR HOLD

## 2013-10-05 LAB — PT/INR
PT INR: 1 (ref 0.9–1.1)
PT INR: 1.3 — ABNORMAL HIGH (ref 0.9–1.1)
PT: 13.2 s (ref 12.6–15.0)
PT: 16 s — ABNORMAL HIGH (ref 12.6–15.0)

## 2013-10-05 LAB — HEMOLYSIS INDEX
Hemolysis Index: 25 — ABNORMAL HIGH (ref 0–18)
Hemolysis Index: 17 (ref 0–18)
Hemolysis Index: 14 (ref 0–18)

## 2013-10-05 LAB — BASIC METABOLIC PANEL
Anion Gap: 7 (ref 5.0–15.0)
Anion Gap: 9 (ref 5.0–15.0)
BUN: 12 mg/dL (ref 7.0–19.0)
BUN: 11 mg/dL (ref 7.0–19.0)
CO2: 24 mEq/L (ref 22–29)
CO2: 20 mEq/L — ABNORMAL LOW (ref 22–29)
Calcium: 9.9 mg/dL (ref 8.5–10.5)
Calcium: 8.6 mg/dL (ref 8.5–10.5)
Chloride: 107 mEq/L (ref 100–111)
Chloride: 110 mEq/L (ref 100–111)
Creatinine: 1.1 mg/dL — ABNORMAL HIGH (ref 0.6–1.0)
Creatinine: 0.9 mg/dL (ref 0.6–1.0)
Glucose: 100 mg/dL (ref 70–100)
Glucose: 137 mg/dL — ABNORMAL HIGH (ref 70–100)
Potassium: 4.5 mEq/L (ref 3.5–5.1)
Potassium: 3.8 mEq/L (ref 3.5–5.1)
Sodium: 138 mEq/L (ref 136–145)
Sodium: 139 mEq/L (ref 136–145)

## 2013-10-05 LAB — COOXIMETRY PROFILE
Carboxyhemoglobin: 0.9 % (ref 0.0–3.0)
Hematocrit Total Calculated: 37.3 % (ref 37.0–47.0)
Hemoglobin Total: 12.1 g/dL (ref 12.0–16.0)
Methemoglobin: 1.7 % (ref 0.0–3.0)
O2 Content: 16.9
Oxygenated Hemoglobin: 96.6 % (ref 85.0–98.0)

## 2013-10-05 LAB — HEMOGLOBIN AND HEMATOCRIT, BLOOD
Hematocrit: 33.8 % — ABNORMAL LOW (ref 37.0–47.0)
Hgb: 11 g/dL — ABNORMAL LOW (ref 12.0–16.0)

## 2013-10-05 LAB — HEMOGLOBIN A1C: Hemoglobin A1C: 5.2 % (ref 0.0–6.0)

## 2013-10-05 LAB — POTASSIUM: Potassium: 4.2 mEq/L (ref 3.5–5.1)

## 2013-10-05 LAB — CREATININE WHOLE BLOOD: Whole Blood Creatinine: 0.88 mg/dL (ref 0.40–1.10)

## 2013-10-05 LAB — TYPE AND SCREEN
AB Screen Gel: NEGATIVE
ABO Rh: B POS

## 2013-10-05 LAB — ELECTROLYTES WHOLE BLOOD
Chloride, WB: 108 mEq/L — ABNORMAL HIGH (ref 98–106)
Whole Blood Glucose: 136 mg/dL — ABNORMAL HIGH (ref 70–100)
Whole Blood Potassium: 3.6 mEq/L (ref 3.5–5.3)
Whole Blood Sodium: 140 mEq/L (ref 136–146)

## 2013-10-05 LAB — GFR
EGFR: >60
EGFR: >60

## 2013-10-05 LAB — COLD AGGLUTININ SCREEN: Cold Screen: NEGATIVE

## 2013-10-05 LAB — CALCIUM, IONIZED: Calcium, Ionized: 2.35 mEq/L (ref 2.30–2.58)

## 2013-10-05 LAB — LACTIC ACID, PLASMA: Lactic Acid: 1.2 mEq/L (ref 0.5–2.2)

## 2013-10-05 LAB — MAGNESIUM
Magnesium: 3.3 mg/dL — ABNORMAL HIGH (ref 1.6–2.6)
Magnesium: 2.6 mg/dL (ref 1.6–2.6)

## 2013-10-05 LAB — APTT: PTT: 29 s (ref 23–37)

## 2013-10-05 SURGERY — CORONARY ARTERY BYPASS
Anesthesia: Anesthesia General | Site: Leg Upper | Wound class: Clean

## 2013-10-05 MED ORDER — FENTANYL CITRATE 0.05 MG/ML IJ SOLN
INTRAMUSCULAR | Status: AC
Start: 2013-10-05 — End: 2013-10-05
  Filled 2013-10-05: qty 2

## 2013-10-05 MED ORDER — ASPIRIN 325 MG PO TABS
325.0000 mg | ORAL_TABLET | Freq: Every day | ORAL | Status: DC
Start: 2013-10-05 — End: 2013-10-05
  Administered 2013-10-05: 325 mg via ORAL
  Filled 2013-10-05: qty 1

## 2013-10-05 MED ORDER — OXYCODONE-ACETAMINOPHEN 5-325 MG PO TABS
2.0000 | ORAL_TABLET | ORAL | Status: DC | PRN
Start: 2013-10-05 — End: 2013-10-06

## 2013-10-05 MED ORDER — INSULIN ASPART 100 UNIT/ML SC SOLN
3.0000 [IU] | Freq: Three times a day (TID) | SUBCUTANEOUS | Status: DC | PRN
Start: 2013-10-05 — End: 2013-10-07

## 2013-10-05 MED ORDER — PROPOFOL 10 MG/ML IV EMUL
INTRAVENOUS | Status: AC
Start: 2013-10-05 — End: ?
  Filled 2013-10-05: qty 50

## 2013-10-05 MED ORDER — SENNOSIDES-DOCUSATE SODIUM 8.6-50 MG PO TABS
1.0000 | ORAL_TABLET | Freq: Every evening | ORAL | Status: DC
Start: 2013-10-05 — End: 2013-10-09
  Administered 2013-10-05 – 2013-10-08 (×4): 1 via ORAL
  Filled 2013-10-05 (×4): qty 1

## 2013-10-05 MED ORDER — SODIUM CHLORIDE 0.9 % IR SOLN
Status: DC | PRN
Start: 2013-10-05 — End: 2013-10-05
  Administered 2013-10-05: 4000 mL

## 2013-10-05 MED ORDER — ONDANSETRON HCL 4 MG/2ML IJ SOLN
4.0000 mg | Freq: Three times a day (TID) | INTRAMUSCULAR | Status: DC | PRN
Start: 2013-10-05 — End: 2013-10-05

## 2013-10-05 MED ORDER — ROCURONIUM BROMIDE 50 MG/5ML IV SOLN
INTRAVENOUS | Status: AC
Start: 2013-10-05 — End: ?
  Filled 2013-10-05: qty 5

## 2013-10-05 MED ORDER — PROPOFOL INFUSION 10 MG/ML
INTRAVENOUS | Status: DC | PRN
Start: 2013-10-05 — End: 2013-10-05
  Administered 2013-10-05: 20 ug/kg/min via INTRAVENOUS

## 2013-10-05 MED ORDER — DEXTROSE 5 % IV SOLN
8000.0000 ug | INTRAVENOUS | Status: DC | PRN
Start: 2013-10-05 — End: 2013-10-05
  Administered 2013-10-05: 2 ug/min via INTRAVENOUS

## 2013-10-05 MED ORDER — NITROGLYCERIN IN D5W 200-5 MCG/ML-% IV SOLN
INTRAVENOUS | Status: AC
Start: 2013-10-05 — End: ?
  Filled 2013-10-05: qty 250

## 2013-10-05 MED ORDER — SODIUM CHLORIDE 0.9 % IV MBP
1.5000 g | Freq: Three times a day (TID) | INTRAVENOUS | Status: AC
Start: 2013-10-06 — End: 2013-10-06
  Administered 2013-10-06 (×2): 1.5 g via INTRAVENOUS
  Filled 2013-10-05 (×2): qty 1500

## 2013-10-05 MED ORDER — ASPIRIN 325 MG PO TABS
325.0000 mg | ORAL_TABLET | Freq: Every day | ORAL | Status: DC
Start: 2013-10-06 — End: 2013-10-09
  Administered 2013-10-06 – 2013-10-09 (×4): 325 mg via ORAL
  Filled 2013-10-05 (×4): qty 1

## 2013-10-05 MED ORDER — CALCIUM CHLORIDE 10 % IV SOLN
INTRAVENOUS | Status: DC | PRN
Start: 2013-10-05 — End: 2013-10-05
  Administered 2013-10-05: 1 g via INTRAVENOUS

## 2013-10-05 MED ORDER — MUPIROCIN 2% NASAL OINTMENT
TOPICAL_OINTMENT | Freq: Two times a day (BID) | CUTANEOUS | Status: DC
Start: 2013-10-05 — End: 2013-10-09
  Administered 2013-10-05 – 2013-10-09 (×9): 1 via NASAL
  Filled 2013-10-05 (×9): qty 1

## 2013-10-05 MED ORDER — PROPOFOL 10 MG/ML IV EMUL
INTRAVENOUS | Status: AC
Start: 2013-10-05 — End: ?
  Filled 2013-10-05: qty 20

## 2013-10-05 MED ORDER — ONDANSETRON 4 MG PO TBDP
4.0000 mg | ORAL_TABLET | Freq: Three times a day (TID) | ORAL | Status: DC | PRN
Start: 2013-10-05 — End: 2013-10-05
  Filled 2013-10-05: qty 1

## 2013-10-05 MED ORDER — ROCURONIUM BROMIDE 50 MG/5ML IV SOLN
INTRAVENOUS | Status: DC | PRN
Start: 2013-10-05 — End: 2013-10-05
  Administered 2013-10-05: 60 mg via INTRAVENOUS
  Administered 2013-10-05: 40 mg via INTRAVENOUS
  Administered 2013-10-05: 50 mg via INTRAVENOUS
  Administered 2013-10-05: 30 mg via INTRAVENOUS

## 2013-10-05 MED ORDER — OXYCODONE-ACETAMINOPHEN 5-325 MG PO TABS
1.0000 | ORAL_TABLET | ORAL | Status: DC | PRN
Start: 2013-10-05 — End: 2013-10-06

## 2013-10-05 MED ORDER — DEXTROSE 5 % IV SOLN
1.0000 ug/min | INTRAVENOUS | Status: DC
Start: 2013-10-05 — End: 2013-10-05

## 2013-10-05 MED ORDER — EPINEPHRINE HCL 1 MG/ML IJ SOLN
1.0000 ug/min | INTRAMUSCULAR | Status: DC
Start: 2013-10-05 — End: 2013-10-05

## 2013-10-05 MED ORDER — DEXTROSE 5 % IV SOLN
5.0000 g | INTRAVENOUS | Status: DC | PRN
Start: 2013-10-05 — End: 2013-10-05
  Administered 2013-10-05: 5 g via INTRAVENOUS

## 2013-10-05 MED ORDER — CHLORHEXIDINE GLUCONATE 0.12 % MT SOLN
15.0000 mL | Freq: Two times a day (BID) | OROMUCOSAL | Status: DC
Start: 2013-10-05 — End: 2013-10-05
  Administered 2013-10-05: 15 mL via OROMUCOSAL
  Filled 2013-10-05: qty 15

## 2013-10-05 MED ORDER — LACTATED RINGERS IV SOLN
INTRAVENOUS | Status: DC | PRN
Start: 2013-10-05 — End: 2013-10-08

## 2013-10-05 MED ORDER — MIDAZOLAM HCL 2 MG/2ML IJ SOLN
INTRAMUSCULAR | Status: DC | PRN
Start: 2013-10-05 — End: 2013-10-05
  Administered 2013-10-05: 2 mg via INTRAVENOUS
  Administered 2013-10-05 (×2): 3 mg via INTRAVENOUS
  Administered 2013-10-05: 2 mg via INTRAVENOUS

## 2013-10-05 MED ORDER — ONDANSETRON HCL 4 MG/2ML IJ SOLN
4.0000 mg | Freq: Every day | INTRAMUSCULAR | Status: DC | PRN
Start: 2013-10-05 — End: 2013-10-09
  Administered 2013-10-05 – 2013-10-06 (×2): 4 mg via INTRAVENOUS
  Filled 2013-10-05: qty 24
  Filled 2013-10-05 (×2): qty 2

## 2013-10-05 MED ORDER — MIDAZOLAM HCL 5 MG/5ML IJ SOLN
INTRAMUSCULAR | Status: AC
Start: 2013-10-05 — End: ?
  Filled 2013-10-05: qty 10

## 2013-10-05 MED ORDER — LACTATED RINGERS IV SOLN
INTRAVENOUS | Status: DC | PRN
Start: 2013-10-05 — End: 2013-10-05

## 2013-10-05 MED ORDER — ALBUMIN HUMAN 5 % IV SOLN
INTRAVENOUS | Status: DC | PRN
Start: 2013-10-05 — End: 2013-10-05

## 2013-10-05 MED ORDER — ROCURONIUM BROMIDE 50 MG/5ML IV SOLN
INTRAVENOUS | Status: AC
Start: 2013-10-05 — End: ?
  Filled 2013-10-05: qty 10

## 2013-10-05 MED ORDER — HYDRALAZINE HCL 20 MG/ML IJ SOLN
10.0000 mg | INTRAMUSCULAR | Status: DC | PRN
Start: 2013-10-05 — End: 2013-10-08

## 2013-10-05 MED ORDER — BISACODYL 10 MG RE SUPP
10.0000 mg | Freq: Every day | RECTAL | Status: DC | PRN
Start: 2013-10-05 — End: 2013-10-09

## 2013-10-05 MED ORDER — SENNOSIDES-DOCUSATE SODIUM 8.6-50 MG PO TABS
1.0000 | ORAL_TABLET | Freq: Every evening | ORAL | Status: DC
Start: 2013-10-05 — End: 2013-10-05

## 2013-10-05 MED ORDER — LIDOCAINE HCL (PF) 2 % IJ SOLN
INTRAMUSCULAR | Status: AC
Start: 2013-10-05 — End: ?
  Filled 2013-10-05: qty 5

## 2013-10-05 MED ORDER — HEPARIN SODIUM (PORCINE) 1000 UNIT/ML IJ SOLN
INTRAMUSCULAR | Status: DC | PRN
Start: 2013-10-05 — End: 2013-10-05
  Administered 2013-10-05: 5000 [IU] via INTRAVENOUS
  Administered 2013-10-05: 25000 [IU] via INTRAVENOUS
  Administered 2013-10-05: 5000 [IU] via INTRAVENOUS

## 2013-10-05 MED ORDER — DOPAMINE-DEXTROSE 1.6-5 MG/ML-% IV SOLN
1.0000 ug/kg/min | INTRAVENOUS | Status: DC
Start: 2013-10-05 — End: 2013-10-05

## 2013-10-05 MED ORDER — INSULIN REGULAR HUMAN 100 UNIT/ML IJ SOLN
2.0000 [IU] | INTRAMUSCULAR | Status: DC | PRN
Start: 2013-10-05 — End: 2013-10-07

## 2013-10-05 MED ORDER — FAMOTIDINE 20 MG PO TABS
20.0000 mg | ORAL_TABLET | Freq: Two times a day (BID) | ORAL | Status: DC
Start: 2013-10-05 — End: 2013-10-07
  Administered 2013-10-05 – 2013-10-07 (×5): 20 mg via ORAL
  Filled 2013-10-05 (×5): qty 1

## 2013-10-05 MED ORDER — OXYCODONE-ACETAMINOPHEN 5-325 MG PO TABS
1.0000 | ORAL_TABLET | ORAL | Status: DC | PRN
Start: 2013-10-05 — End: 2013-10-05

## 2013-10-05 MED ORDER — EPINEPHRINE HCL 1 MG/ML IJ SOLN
4000.0000 ug | INTRAMUSCULAR | Status: DC | PRN
Start: 2013-10-05 — End: 2013-10-05
  Administered 2013-10-05: 2 ug/min via INTRAVENOUS

## 2013-10-05 MED ORDER — FENTANYL CITRATE 0.05 MG/ML IJ SOLN
25.0000 ug | INTRAMUSCULAR | Status: DC | PRN
Start: 2013-10-05 — End: 2013-10-09
  Administered 2013-10-05 – 2013-10-08 (×9): 25 ug via INTRAVENOUS
  Filled 2013-10-05 (×5): qty 2

## 2013-10-05 MED ORDER — CEFUROXIME SODIUM 1.5 G IJ/IV SOLR (WRAP)
1.5000 g | Freq: Three times a day (TID) | Status: DC
Start: 2013-10-05 — End: 2013-10-05
  Administered 2013-10-05: 1.5 g via INTRAVENOUS
  Filled 2013-10-05 (×3): qty 1500

## 2013-10-05 MED ORDER — POLYETHYLENE GLYCOL 3350 17 G PO PACK
17.0000 g | PACK | Freq: Every day | ORAL | Status: DC
Start: 2013-10-05 — End: 2013-10-09
  Administered 2013-10-06 – 2013-10-09 (×4): 17 g via ORAL
  Filled 2013-10-05 (×4): qty 1

## 2013-10-05 MED ORDER — PROPOFOL INFUSION 10 MG/ML
10.0000 ug/kg/min | INTRAVENOUS | Status: DC
Start: 2013-10-05 — End: 2013-10-05

## 2013-10-05 MED ORDER — ACETAMINOPHEN 325 MG PO TABS
650.0000 mg | ORAL_TABLET | ORAL | Status: DC | PRN
Start: 2013-10-05 — End: 2013-10-09

## 2013-10-05 MED ORDER — PROTAMINE SULFATE 10 MG/ML IV SOLN
INTRAVENOUS | Status: DC | PRN
Start: 2013-10-05 — End: 2013-10-05
  Administered 2013-10-05: 250 mg via INTRAVENOUS

## 2013-10-05 MED ORDER — NITROGLYCERIN IN D5W 200-5 MCG/ML-% IV SOLN
INTRAVENOUS | Status: AC
Start: 2013-10-05 — End: 2013-10-06
  Filled 2013-10-05: qty 250

## 2013-10-05 MED ORDER — ALBUMIN HUMAN 5 % IV SOLN
INTRAVENOUS | Status: AC
Start: 2013-10-05 — End: ?
  Filled 2013-10-05: qty 250

## 2013-10-05 MED ORDER — POTASSIUM CHLORIDE 20 MEQ/50ML IV SOLN
INTRAVENOUS | Status: AC
Start: 2013-10-05 — End: 2013-10-05
  Filled 2013-10-05: qty 50

## 2013-10-05 MED ORDER — FENTANYL CITRATE 0.05 MG/ML IJ SOLN
INTRAMUSCULAR | Status: AC
Start: 2013-10-05 — End: ?
  Filled 2013-10-05: qty 10

## 2013-10-05 MED ORDER — DEXTROSE 50 % IV SOLN
25.0000 mL | INTRAVENOUS | Status: DC | PRN
Start: 2013-10-05 — End: 2013-10-07

## 2013-10-05 MED ORDER — FENTANYL CITRATE 0.05 MG/ML IJ SOLN
INTRAMUSCULAR | Status: AC
Start: 2013-10-05 — End: ?
  Filled 2013-10-05: qty 20

## 2013-10-05 MED ORDER — SODIUM CHLORIDE 0.9 % IV SOLN
INTRAVENOUS | Status: DC
Start: 2013-10-05 — End: 2013-10-07

## 2013-10-05 MED ORDER — EPHEDRINE SULFATE 50 MG/ML IJ SOLN
INTRAMUSCULAR | Status: AC
Start: 2013-10-05 — End: ?
  Filled 2013-10-05: qty 1

## 2013-10-05 MED ORDER — AMINOCAPROIC ACID 250 MG/ML IV SOLN
20.0000 g | INTRAVENOUS | Status: DC | PRN
Start: 2013-10-05 — End: 2013-10-05
  Administered 2013-10-05: 1 g/h via INTRAVENOUS

## 2013-10-05 MED ORDER — VEIN SOLUTION (OR ONLY) (FX)
Status: DC | PRN
Start: 2013-10-05 — End: 2013-10-05
  Administered 2013-10-05: 1

## 2013-10-05 MED ORDER — INSULIN REGULAR HUMAN 100 UNIT/ML IJ SOLN
2.0000 [IU]/h | INTRAMUSCULAR | Status: DC
Start: 2013-10-05 — End: 2013-10-07
  Administered 2013-10-06 (×2): 1 [IU]/h via INTRAVENOUS
  Administered 2013-10-07: 0.5 [IU]/h via INTRAVENOUS
  Filled 2013-10-05 (×3): qty 1

## 2013-10-05 MED ORDER — FENTANYL CITRATE 0.05 MG/ML IJ SOLN
INTRAMUSCULAR | Status: DC | PRN
Start: 2013-10-05 — End: 2013-10-05
  Administered 2013-10-05: 200 ug via INTRAVENOUS
  Administered 2013-10-05: 100 ug via INTRAVENOUS
  Administered 2013-10-05 (×2): 250 ug via INTRAVENOUS
  Administered 2013-10-05: 150 ug via INTRAVENOUS
  Administered 2013-10-05: 200 ug via INTRAVENOUS
  Administered 2013-10-05: 100 ug via INTRAVENOUS

## 2013-10-05 MED ORDER — AMIODARONE HCL 200 MG PO TABS
200.0000 mg | ORAL_TABLET | Freq: Two times a day (BID) | ORAL | Status: DC
Start: 2013-10-05 — End: 2013-10-05

## 2013-10-05 SURGICAL SUPPLY — 67 items
ADAPTER CORONARY CARDIOPLEGIA (Adapter) ×4 IMPLANT
APPLCATOR CHLORAPREP 26ML (Prep) ×36 IMPLANT
APPLIER CLIP 9.0 W/SMALL CLIPS (Clips) ×8 IMPLANT
BANDAGE COBAN STERILE 4IN LF (Bandage) ×1
BANDAGE KERLIX MEDIUM GAUZE L3.6 YD X (Dressing) ×3 IMPLANT
BLADE S/SU RIBBACK CARB STL 15 (Blade) ×4 IMPLANT
CABLE PACING (Cable) ×8 IMPLANT
CANNULA EZ AORTIC 21F (Cannulated) ×3 IMPLANT
CANNULA PERFUSION CORONARY SINUS OD14 FR ODSEC18 MM L10.5 IN EDWARDS (Cannula) ×3 IMPLANT
CANNULA PRFSN PVC 14FR 18MM 10.5IN STRL (Cannula) ×3
CANNULA RETROPLEGIA 14F W TEXT (Cannula) ×1
CATH THORACIC RT ANG STRL 28F (Drain) ×12 IMPLANT
CLIP SPRING AVD DBL TRAC 6MM (Clips) ×4 IMPLANT
CLIP SURGICAL SPRING SOFT LIGH (Clips) ×4 IMPLANT
CONNECTOR PERFUSION 3/8IN Y TMP STERILE (Connector) ×9 IMPLANT
CONNECTOR PRFSN Y TMP 3/8IN STRL (Connector) ×9
CONNECTOR Y 3/8X3/8X3/8IN (Connector) ×3
DRAIN CHEST ADULT DRY SUCTION (Drain) ×1
DRAIN CHEST THORACIC TUBE DRY SUCTION (Drain) ×3
DRAIN CHEST THORACIC TUBE DRY SUCTION COLLECTION CHAMBER ATRIUM OASIS (Drain) ×3 IMPLANT
DRAPE SLUSH MACHINE 66 X 44 (Drape) ×4 IMPLANT
DRESSING HEMOSTAT 2X14IN ABSOR (Dressing) ×4 IMPLANT
DRESSING L4 IN X W4 IN FOAM POLYURETHANE NONADHESIVE HIGH ABSORBENT (Dressing) ×12 IMPLANT
DRESSING L4 IN X W4 IN MEDLINE FOAM (Dressing) ×12
DRESSING OPTIFOAM 4X4 (Dressing) ×4
FIBRIN GLUE TISSEEL 10ML (Hemostat) ×4 IMPLANT
GAUZE KRLX STRL 3.4INX3.6YRD (Dressing) ×1
GLOVE PI ULTRA TOUCH 6.5 (Glove) ×1
GLOVE SURGICAL 6 1/2 BIOGEL PI (Glove) ×3
GLOVE SURGICAL 6 1/2 BIOGEL PI ULTRATOUCH G POWDER FREE BEAD CUFF (Glove) ×3 IMPLANT
INACTIVE USE LAWSON 110816 (Procedure Accessories) ×4 IMPLANT
INSERT SET SOFD JAW 86 (Insert) ×4 IMPLANT
KIT NURSE CABG IAH (Kits) ×4 IMPLANT
NET HEART SUPPORT (Vascular) ×8 IMPLANT
PACK PERFUSION ADULT LG (Pack) ×4 IMPLANT
PACK PRESTIGE CARDIOVASCULAR (Pack) ×4 IMPLANT
PUNCH DISP 4.4MM (Instrument) ×4 IMPLANT
SLEEVE CMPR NYL LG KN LGTH SCD EXP LF NS (Procedure Accessories) ×3
SLEEVE COMPRESSION NYLON LARGE KNEE LENGTH KENDALL ADJUSTABLE (Procedure Accessories) ×3 IMPLANT
SLEEVE SCD CMFRT KNEE LG (Procedure Accessories) ×1
SUTURE COATED VICRYL 2 TP-1 L54 IN BRAID (Suture) ×3 IMPLANT
SUTURE POLYPROPYLENE PROLENE BLUE 4-0 (Suture) ×9
SUTURE POLYPROPYLENE PROLENE BLUE 4-0 RB-1 1/2 CIRLE L36 IN 2 ARM (Suture) ×9 IMPLANT
SUTURE PROLENE 4-0 RB1 36IN (Suture) ×3
SUTURE PROLENE 4-0 SH-1 BL MON (Suture) ×1
SUTURE PROLENE 5-0 RB2 30IN (Suture) ×8 IMPLANT
SUTURE PROLENE 6-0 RB2 30IN (Suture) ×8 IMPLANT
SUTURE PROLENE 7-0 BV1 24IN (Suture) ×4 IMPLANT
SUTURE PROLENE 8-0 BV175-6 (Suture) ×8 IMPLANT
SUTURE PROLENE BLUE 4-0 SH-1 L30 IN 2 (Suture) ×3
SUTURE PROLENE BLUE 4-0 SH-1 L30 IN 2 ARM MONOFILAMENT NONABSORBABLE (Suture) ×3 IMPLANT
SUTURE SILK 2-0 CT1 8X18IN (Suture) ×4 IMPLANT
SUTURE SS STEEL SILVER 5 CCS L18 IN MNFLMNT 4 STRAND NNBSRBBL (Suture) ×6 IMPLANT
SUTURE STAINLESS STEEL STEEL SILVER 5 (Suture) ×6
SUTURE STEEL 5 CCS 4X18IN (Suture) ×2
SUTURE VICRYL 2 TP1 54IN (Suture) ×1
SYRINGE LUER LOCK 10CC (Syringes, Needles) ×4 IMPLANT
TAPE MEDIPORE 3INX10YD (Tape) ×4
TAPE SRGCL 10YDX3IN HYPOALLERGENIC WTR RSSTNT MEDIPORE H SFT CLTH (Tape) ×12 IMPLANT
TAPE SURGICAL L10 YD X W3 IN (Tape) ×12
TIP SUCTION SELEC-TROL LARGE OD24 FR (Disposable Instruments) ×3
TIP SUCTION SELEC-TROL LARGE OD24 FR VINYL (Disposable Instruments) ×3 IMPLANT
TOWEL BLUE STERILE (Procedure Accessories) ×24 IMPLANT
TUBE BAIR HUGGER WARMING (Procedure Accessories) ×4 IMPLANT
WRAP COMPRESSION L5 YD X W4 IN SELF (Bandage) ×3
WRAP COMPRESSION L5 YD X W4 IN SELF ADHERENT ELASTIC LIGHTWEIGHT HAND (Bandage) ×3 IMPLANT
YANKAUER ARGYLE SUCT OPEN TIP (Disposable Instruments) ×1

## 2013-10-05 NOTE — Plan of Care (Signed)
Severe Sepsis Screen  Date: 10/05/2013 Time: 4:24 PM  Nurse Signature: Melburn Popper O'Neill    A. Infection:    Does your patient have ONE or more of the following infection criteria?     []  Documented Infection - Does the patient have positive culture results (from blood,        sputum, urine, etc)?   []  Anti-Infective Therapy - Is the patient receiving antibiotic, antifungal, or other                anti-infective therapy?   []  Pneumonia - Is there documentation of pneumonia (X Ray, etc)?   []  WBC's - Have WBC's been found in normally sterile fluid (urine, CSF, etc.)?   []  Perforated Viscus -Does the patient have a perforated hollow organ (bowel)?    A.  Did you check any of the boxes above?    []  No  If No, Stop Here and Sepsis Screen Negative.               []  Yes, continue to section B      B. SIRS:     Does your patient have TWO or more of the following SIRS criteria (ensure vital signs & temperature are within 1 hour of this screening)?    []  Temperature - Is the patient's temperature: Temp: (!) 96.8 F (36 C) (10/05/13 1416)   - Greater than or equal to 38.3 degrees C (greater than 100.9 degrees F)?   - Less than or equal to 36 degrees C (less than or equal to 96.8 degrees F)?    []  Heart Rate: Heart Rate: 72 (10/05/13 1600)   - Is the patient's heart rate greater than or equal to 90 bpm?    []  Respiratory: Resp Rate: 16 (10/05/13 1600)   - Is the patient's respiratory rate greater than or equal to 20?    []  WBC Count - Is the patient's WBC count:   Recent Labs  Lab 10/05/13  1417   WBC 13.22*      - Greater than or equal to 12,000/mm3 OR   - Less than or equal to 4,000/mm3 OR    - Are there greater than 10% immature neutrophils (bands)?    []  Glucose >140 without diabetes?   Recent Labs  Lab 10/05/13  1417   GLUCOSE 137*       []  Significant edema?    B.  Did you check two or more of the boxes above?     []  No, Stop Here and Sepsis Screen Negative   []  Yes, continue to section C    C.  Acute Organ  Dysfunction     Does your patient have ONE or more of the following organ dysfunction? (May need to wait for lab results for assessment - see below) Organ dysfunction must be a result of the sepsis not chronic conditions.    []  Cardiovascular - Does the patient have a: BP: 113/67 mmHg (10/05/13 1600)   -systolic Blood pressure less than or equal to 90 mmHg OR   -systolic blood pressure has dropped 40 mmHg or more from baseline OR   -mean arterial pressure less than or equal to 70 mmHg (for at least one hour   despite fluid resuscitation OR require vasopressor support?  []  Respiratory - Does the patient have new hypoxia defined by any of the following"   -A sustained increase in oxygen requirements by at least 2L/min on NC or 28%    FiO2  within the last 24 hrs OR   -A persistent decrease in oxygen saturation of greater than or equal to 5% lasting   at least four or more hours and occurring within the last 24 hours  []  Renal - Does the patient have:   -low urine output (e.g. Less than 0.73mL/kg/HR for one hour despite adequate fluid    resuscitation, OR   -Increased creatinine (greater than 50% increase from baseline) OR   -require acute dialysis?  []  Hematologic - Does the patient have a:   -Low platelet count (less than 100,000 mm3)   Recent Labs  Lab 10/05/13  1417   PLATELETS 143   OR   -INR/aPTT greater than upper limit of normal?  Recent Labs  Lab 10/05/13  1417   PT INR 1.3*    or                No results for input(s): APTT in the last 168 hours.  []  Metabolic - Does the patient have a high lactate (plasma lactate greater than or equal to 2.4 mMol/L)? No results for input(s): LACTATE in the last 168 hours.    []  Hepatic - Are the patient's liver enzymes elevated (ALT greater than 72 IU/L or Total      Bilirubin greater than 2 MG/dL)?    Recent Labs  Lab 10/01/13  1551   BILIRUBIN, TOTAL 0.6   ALT 18     []  CNS - Does the patient have altered consciousness or reduced Glasgow Coma      Scale?    C.  Did you check  any of the boxes above?     []  No, Sepsis Screen Negative   []  YES:  A) Infection + B) SIRS + C) Organ Dysfunction = Positive Screen for Severe Sepsis     Notify MD and document in Complex Assessment under provider notification   - Name of physician notified:                                         - Date/Time Notifiied:                                       Document actions: Following must be completed within 1 hour of positive sepsis screen   []  Lactate drawn (if initial lactate > , repeat lactate in 2 hours for goal decrease 10-20%)   []  Blood Cultures obtained (prior to antibiotic administration; if not done within the last 48 hours)   []  Antibiotics initiated or modified    []   IV Fluid administered 0.9% NS ___1000_______ mLs given (Initial Bolus of 30 ml/kg if SBP < 90 or MAP < 65 or lactate greater than 4 mmol/dl)  Nursing Comments?:     _________________________________________________________________    Patients meeting the following criteria are excluded from screening (check if applicable):   []  [place] hypothermia protocol   []  Comfort Care orders   [x]  Surgery- No screening for 24 hours after surgery (48 hours after CV surgery)       -If Yes. Date of surgery:___9/15/15___________________   []  Admitted with sepsis and until 72 hours after admission with documented sepsis (RESUME SEPSIS SCREEN AFTER 72 hour window!!!)      -If Yes, Date of Documented Sepsis:______________________   []   Positive screen AND Completed sepsis bundle during previous 24 hours       -If Yes, Date/Time of positive severe sepsis screen:_______________________

## 2013-10-05 NOTE — Anesthesia Preprocedure Evaluation (Signed)
Anesthesia Evaluation    AIRWAY    Mallampati: II    TM distance: >3 FB  Neck ROM: full  Mouth Opening:full   CARDIOVASCULAR    cardiovascular exam normal       DENTAL           PULMONARY    pulmonary exam normal     OTHER FINDINGS    Pt recently diagnosed with CAD, good exercise tolerance, normal EF. Denies current chest discomfort or focal neurological signs.                  Anesthesia Plan    ASA 3     general                     intravenous induction   Detailed anesthesia plan: general endotracheal  Monitors/Adjuncts: arterial line, CVP, BIS, PA cath and TEE    Post Op: ICU  Post op pain management: per surgeon    informed consent obtained    Plan discussed with CRNA.    ECG reviewed  pertinent labs reviewed  imaging results reviewed

## 2013-10-05 NOTE — Consults (Signed)
Service Date: 10/05/2013     Patient Type: I     CONSULTING PHYSICIAN: Lattie Haw MD     REFERRING PHYSICIAN:      REFERRING PHYSICIAN:  Vedia Coffer, MD     CONSULTING SERVICE:  Pulmonary.     REASON FOR CONSULTATION:  1.  Coronary artery disease, status post coronary artery bypass grafting.  2.  Acute respiratory failure.  3.  Unstable angina.  4.  History of pituitary gland tumor.     HISTORY OF PRESENT ILLNESS:  Ms. Helminiak is a 53 year old female who was admitted on September 11 with  complaints of chest pain, shortness of breath, and right arm numbness.  She  also had a CT scan done as an outpatient, which showed a high calcium  score.  The patient underwent coronary angiogram on September 14, which  showed left main disease with ulcer.  The patient was referred for  preoperative cardiac surgery for bypass grafting.  The patient underwent  coronary artery bypass grafting this morning and was transferred to  intensive care unit.  At present, she is on ventilator and sedated.  Most  history is obtained from her medical record.     PAST MEDICAL HISTORY:  Significant for hyperlipidemia, anxiety, pituitary tumor.     PRESENT MEDICATIONS:  Aspirin, Lipitor, cefuroxime for the perioperative period, Pepcid, Advair.         HOME MEDICATIONS:  Symbicort, Flonase, and vitamin D.     ALLERGIES:  She has no known drug allergy.     SOCIAL HISTORY:  She has no history of alcohol or tobacco abuse.     FAMILY HISTORY:  Her mother had coronary artery disease and myocardial infarction at age 98.   She also had hypertension.     REVIEW OF SYSTEMS:  Cannot be obtained as patient is on ventilator.     PHYSICAL EXAMINATION:  GENERAL:  At present, she is on ventilator.  VITAL SIGNS:  Blood pressure is 113/67.  Pulse is 72.  She is afebrile.  HEENT:  Atraumatic, normocephalic.  Pupils are reactive.  NECK:  Supple, no JVD.  CARDIOVASCULAR:  S1 and S2, regular rate and rhythm.  LUNGS:  Clear breath sounds bilaterally.  No crackles or  wheeze.  ABDOMEN:  Soft with positive bowel sounds.  EXTREMITIES:  Have trace edema.     LABORATORY DATA:  White cell count is 13.2, hemoglobin 11.5, hematocrit 34.4, platelets 143.   Sodium 139, potassium 3.8, chloride 110, carbon dioxide 20, BUN 11,  creatinine 0.9.     ASSESSMENT AND PLAN:  1.  Coronary artery disease, status post coronary artery bypass grafting  x2.  The patient is on pressors, which is being weaned down.  Continue  present medication.  2.  Acute respiratory failure secondary to coronary artery bypass grafting  today secondary to coronary artery bypass grafting, which was done today.   We will start weaning for possible extubation today.  3.  Seasonal allergic disorder with possible asthma.  Continue albuterol  nebulizer and Advair.  4.  Hyperlipidemia.  Continue Lipitor.           D:  10/05/2013 17:02 PM by Dr. Lattie Haw, MD ([id number])  T:  10/05/2013 17:20 PM by       Everlean Cherry: [id number]) (Doc ID: [id number])

## 2013-10-05 NOTE — Consults (Signed)
Seen and examined, full note to follow

## 2013-10-05 NOTE — Plan of Care (Signed)
Problem: Post-op Cardiac Surgery  Goal: Adequate cardiac output  Outcome: Progressing  Patient s/p CABG x 2 returned from OR @ 1400 intubated not on pressors.  BP decreased to 80/30 on arrival given 250 Albumin x 1 and started on Levophed @ 15mcq/min, Propofol d/ced.  Porteable chest xray obtained blood pressure increased to 150/80 given Fentanyl 74mcq/min, weaned Levophed off and Propofol stopped at 1445.  Blood pressure normalized and maintaining map >65.  Chest tube x 3 draining sanginous fluid total of 130cc this shift.  Patient extubated @ 1726 to o2 4L NC.  Will follow postop CABG care.

## 2013-10-05 NOTE — Progress Notes (Signed)
[facility] Case Management Assessment   (for Readmissions and Non-Readmissions)  (Unit SW/CMs to complete top and Sections A to D.  ED SW/CM to complete top and Sections A, E, F.)    Please check one (X):  Pt is 30 day INPATIENT readmission  Pt is NOT a readmission  X   Pt is a Medicare Focus Dx  Pt is NOT a Medicare Focus Dx  X     Summary and Discharge Plan (identify potential needs and discharge barriers):    Met with pt's family while pt in OR.  She lives with her spouse, Chrisopher.  Her parents are visiting from [place].  They have a dtr in college who is not currently aware of her mother's admission and surgery.    Pt working and fully independent prior to admission.  They live in a split level home with 12 steps up to the bathroom.  Discussed anticipated plan for home with [facility] visits by RN.  Referred to [facility] liaision.    Call received from [place]lyn, Sports coach at [place], [phone]. She states they work with preferred home health agencies which allow closer f/u of pt's.  [facility] is included.  Information relayed to [facility] liaison.     Signed [id number] Trey Bebee RN x [phone]           A. PSYCHOSOCIAL / DEMOGRAPHIC STATUS (DONE BY UNIT DCP & ED SW/CM STAFF)  Name(s) of person(s) being interviewed.  If not patient, indicate why.  spouses   Orientation and decision-making abilities of patient (AOx3 and able to make decisions, vented and sedated, etc)  per family A/O, pt in OR   Does the patient have an Advance Directive?  (Y/N)  Location?  (Home/Scanned into EPIC? If home, advised to bring in copy?) <no information>]  Advance Directive: Patient does not have advance directive]   Healthcare Decision Maker (HDM), if other than the patient?  (Include relationship and contact information & update face sheet)  spouse   Any additional emergency contacts?   Extended Emergency Contact Information  Primary Emergency Contact: Jones,Christopher   [place]  Home Phone: [phone]  Mobile Phone: [phone]  Relation: Spouse    Do you want to designate an individual who will care for or assist you upon discharge?  (Y/N)    If yes: Please list the name, relationship, phone number, and address of the designated individual. Name:  Relationship:  Phone:  Address:   Pt lives with... ?   Living Arrangements: Spouse/significant other]   Type of residence where patient lives Conservation officer, historic buildings, apartment, homeless shelter, ALF, etc.)?    ]   ]split level home   Prior level of functioning (ambulation & ADL's)?    ]independent   Support System [employer], friends, extended family, etc.)?  family   Correct insurance listed on face sheet (verified with patient/HDM)? (Y/N) yes   For Medicare/Medicare HMO patients only: Was an initial IMM signed within 24 hours of admission?  (Go to Chart Review tab > Media tab)  na       B. CURRENT DISCHARGE PLANNING SERVICES (DONE BY UNIT DCP STAFF ONLY)  Name of Primary Care Physician verified in patient banner (update in patient banner if not listed) Devick, Philomena Doheny, MD  [phone]   What DME does the patient currently own? (rolling walker, hospital bed, home O2, BiPAP/CPAP, bedside commode, cane, hoyer lift)  ]   ]none   ]   Are PT/OT services indicated? If so, has it been ordered?  no, NP will assess need post-op   Has the patient been to an Acute Rehab or SNF in the past?  If so, where?    no   Does the patient currently have home health or hospice/palliative services in place?  If so, list agency name.   no   Does the patient already have community dialysis set up?  If so, where? na       C. ANTICIPATED DISCHARGE PLAN (DONE BY UNIT DCP STAFF ONLY)  Anticipated Disposition: Option A      home with HH   Anticipated Disposition: Option B         Patient/family aware and in agreement with discharge plan(s)? yes   Who will transport the patient upon discharge?    anticipate family transport   If applicable, were SNF or Hospice choices provided?    no   Palliative Care Consult needed? (if yes, contact attending MD)         no       D. READMISSION ASSESSMENT (DONE BY UNIT DCP STAFF ONLY, IF READMIT)  What is the current LACE score?  7   Is this patient an inpatient to inpatient 30 day readmission?  no   Previous admission discharge diagnosis?    Was patient readmitted from a facility?  If so, which one?        Patient active with Home Health?        Patient active with Home Hospice?       Contributing factors to readmission (i.e., no follow up appt on previous d/c, unable to get meds, no insurance, no social support, etc.)    Did patient/family understand what medication was for and how to administer, symptoms to indicate worsening condition, activity and diet restrictions at time of previous d/c?        Previous admission discharge diagnosis?     If readmission, what was previous discharge plan?    What contributed to readmission? (No follow up appt on previous [place], unable to get meds, no insurance, no social support, etc.)     Was there understanding at time of previous discharge of:  1. Reason for medications,  2. Symptoms that indicate worsening conditions,  3. Activity/Diet restrictions?    IN EPIC:  CM Assessment complete button selected in CM Navigator? Yes/No    IN EPIC:  Readmissions flowsheet completed in CM Navigator if patient is readmission? Yes/No         E. ED READMISSION SCREENING (DONE BY ED SW/CM ONLY)  What is patient's LACE score?    Current ED diagnosis   Reason for Admission     Chest pain of uncertain etiology     786.59          Previous admission discharge diagnosis     Previous admission date and facility 07/21/01  [facility]   Is this ED visit related to previous admission?     Contributing factors to readmission (i.e., no follow up appt on previous d/c, unable to get meds, no insurance, no social support, etc.)    Did patient/family understand what medication was for and how to administer, symptoms to indicate worsening condition, activity and diet restrictions at time of previous d/c?        Was  there understanding at time of previous discharge of:  1. Reason for medications,  2. Symptoms that indicate worsening conditions,  3. Activity/Diet restrictions?    Was patient readmitted from a facility?  If so,  which one?        Patient active with Home Health?        Patient active with Home Hospice?       What DME does the patient currently own? (rolling walker, hospital bed, home O2, BiPAP/CPAP, bedside commode, cane, hoyer lift)  ]   ]   ]   Does the patient currently have home health or hospice/palliative services in place?  If so, list agency name.      Does the patient already have community dialysis set up?  If so, where?    Name of attending/ED MD notified of potential readmission, method and time     Can Case Management interventions be implemented to avoid readmission?  (Y/N)     Patient/family aware and in agreement with intervention(s)?     ED Disposition (admit or discharge)     Admitting diagnosis    Admitting patient class    IN EPIC:  CM Assessment complete button selected in CM Navigator? Yes/No    IN EPIC:  Readmissions flowsheet completed in CM Navigator if patient is readmission? Yes/No           F. CM INTERVENTIONS DONE TO PREVENT READMISSION (DONE BY ED SW/CM ONLY)   X Intervention(s):  Select all that apply Facility / Therapist, art         Palliative/Hospice Care Services         Facility (LTC, LTACH, SNF, Acute Rehab, ALF)         APPOINTMENT:  Discharge Clinic follow up         APPOINTMENT:  PCP follow-up       APPOINTMENT:  IMG follow-up        Any other Services (TCM, CSB, Resources)         Template created and updated by [employer], CM Supervisor (09/20/13)

## 2013-10-05 NOTE — Transfer of Care (Signed)
Anesthesia Transfer of Care Note    Patient: [name]    Procedures performed: Procedure(s) with comments:  CORONARY ARTERY BYPASS - LIMA TO LAD   SVG TO OM2      ENDOSCOPIC,VEIN HARVEST  TEE - probe# [place] probe    Anesthesia type: General ETT    Patient location:ICU    Last vitals:   Filed Vitals:    10/05/13 1416   BP: 99/51   Pulse: 62   Temp: 36 C (96.8 F)   Resp: 10   SpO2: 100%       Post pain: Patient not complaining of pain, continue current therapy      Mental Status:sedated    Respiratory Function: intubated    Cardiovascular: stable    Nausea/Vomiting: patient not complaining of nausea or vomiting    Hydration Status: adequate    Post assessment: no apparent anesthetic complications and no reportable events     Pt transferred to ICU bagged on 100% O2. VSS Dr Terrace Arabia at bedside.  No drips

## 2013-10-05 NOTE — Progress Notes (Signed)
Cardiothoracic Surgery with Dr. Tamsen Meek  Pt for CABG in AM  Pt was admitted with R+ arm and R+ face numbness. F/u by neurologists. Neck and Head MRA, unremarkable. Brain MRI unremarkable. Pt reported feeling well. Dr. Francesco Sor called to informed about CABG planned for AM. Dr. Francesco Sor verbalized that pt can proceed with CABG surgery as planned.

## 2013-10-05 NOTE — OR Nursing (Signed)
REPORT GIVEN NP REJINA AND Family has been notified

## 2013-10-05 NOTE — Op Note (Signed)
FINAL                    [facility]                              Operative Report                                  AMENDED        Patient Name:              [name], [name]  MRN:                       [id number]  DOB:                       [birth year]  Surgery Date:              10/05/2013  Performing Facility:       [facility]  Admission Date:            10/01/2013  Surgeon:                   Hasani Diemer,M.D.  Perfusionist:              Amadeo Garnet, C.C.P.  Ref Cardiologist:          Rajan,Narian,M.D.  First Ass1t:               Bjorkman,Danny,ERNFA  Second Ass1t:              Cleary,Gabrielle,RNFA  Referring MD:              Moalemi,Azita,M.D.     Pre Operative Diagnosis:      Coronary Artery  Disease        Surgery Indications      CAD        Procedure(s):    CABGx 2     Clinical History  Pt Rekita, Morton 53  y/o female presented  to [facility] c/c right  arm numbness  and right facial  numbness and sharp  chest pain; centralized,  radiating to her  back, . Patient  notes fatigue and  nausea but denies  shortness of breath,  fevers, rash,  urinary symptoms,  vomiting.  Pt  had negative MRI  brain for any acute  lesions. MRA  head/neck also  wnl. Pt did have  right arm numbness  and right facial  numbness 2  months ago, neuro  studies were negative  too.  Pt had  HA associated  to nitroglycerine  administration.  CP was release with  nitroglycerine.  Troponin < 0.01.  Pt is now CP free.      Pt has family h/o  premature CAD,  her mother has had  MI at 33 y/o.  Pt has pituitary  tumor; benign prolactinoma,  seasonal  allergies and  anxiety . As outpt  pt went for Virtual  Physical, and  cardiac calcium  scoring was reported  at 629, high.  Patient had an  EKG done at the  office of cardiology,  which reveals  incomplete right  bundle branch  block. Blood pressure  was 142/100.  BP now is well  controlled.  Cardiac cath done  today by Dr. 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 This lesion  involves the  ostium of the circumflex,  which  is a very large,  dominant vessel,  probably 90% and  the proximal LAD  of 70% to 80%. Cardiac  surgeon consulted  for re vascularization  with  CABG.     STS risk scores:  risk of mortality:  0.376%, morbidity  or mortality  7.215%.           Cardiac cath: 10/04/2013  by Dr. Glean Hess  PRECATHETERIZATION  DIAGNOSIS:  A 53 year old female  who presents  for cardiac evaluation  with  substernal  chest pain, right-sided.     POSTCATHETERIZATION  DIAGNOSES:  High-grade left  main stenosis of  60% to 70%, ostial  large, dominant  circumflex of 90%,  proximal left  anterior descending  of 80%, small  right  coronary artery,  normal left ventricular  systolic  function, ejection  fraction 60%.     TITLE OF PROCEDURES:  Left heart catheterization,  left  and right coronary  angiography,  left  ventricular angiography,  transradial  approach.     BRIEF HISTORY:  The patient is a  53 year old female  who initially  presented with  atypical  symptoms of substernal  right-sided  chest pain and  neurologic  findings. She  ended up having  recurrent angina  and cardiac catheterization  was  recommended. It  was unable to be  done on Friday due  to technical  reasons  and she waited over  the weekend.     HEMODYNAMICS:  Blood pressure was  130/70, LVEDP  was 10 mmHg, heart  rate was 60  beats a  minute.     DESCRIPTION OF PROCEDURE:  After informed consent  was obtained,  the right radial  site was  utilized. A  5-French sheath  was placed and IV  heparin 3000 units  was given  intravenously. Intraarterial  nitroglycerin  and verapamil  was used in  200  mcg and 2.5 mg infections.  The patient  tolerated the  procedure well  without any complications.  Then LV  angiography was  performed using  a  pigtail catheter  advanced across  the aortic valve  at 12 mL, total  volume of  25 mL. A pullback  was recorded across  the aortic  valve.  The patient  tolerated the procedure  well without  any difficulties.  A TR Band was  placed in the right  radial site without  any complications  using 10  mL of  air.     CORONARY ANGIOGRAPHY:  Left coronary angiography  demonstrates  a left main  with a tapering  of  approximately 60%  of the distal left  main. This lesion  involves the  ostium  of the circumflex,  which is a very  large, dominant  vessel, probably  90% and  the proximal LAD  of 70% to 80%. The  LAD runs across  the anterior  wall,  giving rise to multiple  septal perforators,  small  diagonal branches,  there  is a wrap around  LAD. The circumflex  marginal branch  trifurcates  into 3 to  4 moderate size  obtuse marginal branch,  this is a  left dominant  system.  The ostium does  have a 90% stenosis.  There are good  graftable site  throughout the LAD  and the circumflex  marginal branch.     The right coronary  artery is a small  vessel without  any significant  flow to  a small PDA.     LEFT VENTRICULAR  ANGIOGRAPHY:  Demonstrates normal  LV symmetric  contractility, EF  60%.     CONCLUSIONS:  A 53 year old female  who presents  with atypical chest  pain, positive  family  history of coronary  artery disease  and angina. The  patient has  significant  CAD noted that is  treatable surgery  with a LIMA to  the LAD and a  vein graft  to the circumflex  marginal branch.  I have discussed  it with the  patient,  the husband, and  Dr. Lovina Reach, and  we will be proceeding  over the  next 24 to  48 hours. In the  meantime, she will  be on aspirin,  statin, beta  blockers,  subcutaneous heparin,  and nitrates.  The patient will  follow up with  Dr.  Lovina Reach upon discharge.        2D echo 10/03/2013  SUMMARY:  - Left ventricle:  - Systolic function  was normal. Ejection  fraction  was estimated in  the range  of 55 % to 65 %.  - Wall thickness  was mildly increased.  - Left ventricular  diastolic function  parameters were  normal.     - Pulmonary  arteries:  - Systolic pressure  was within the  normal range.     FINDINGS:     LEFT VENTRICLE:  Size was normal.  Systolic function  was normal.  Ejection  fraction was estimated  in the range  of 55 % to 65  %. There were no  regional  wall motion abnormalities.  Wall thickness  was mildly  increased.  Doppler: Left  ventricular diastolic  function parameters  were normal.     VENTRICULAR SEPTUM:  No ventricular  shunt was identified.     RIGHT VENTRICLE:  The size was normal.  Systolic function  was normal.  Wall  thickness was normal.     LEFT ATRIUM: Size  was normal.     ATRIAL SEPTUM: There  was no left-to-right  shunt and  no right-to-left  shunt.     RIGHT ATRIUM: Size  was normal.     AORTIC VALVE: The  valve was trileaflet.  Leaflets exhibited  normal  thickness and  normal cuspal separation.  Doppler:  Transaortic velocity  was within  the normal  range. There was  no stenosis. There  was no regurgitation.     MITRAL VALVE: Valve  structure was  normal. There was  normal leaflet  separation.  No echocardiographic  evidence for  prolapse. Doppler:  The transmitral  velocity  was within the normal  range. There  was no evidence  for stenosis.  There was no  regurgitation.     TRICUSPID VALVE:  The valve structure  was normal. There  was normal  leaflet  separation. Doppler:  The transtricuspid  velocity was  within the  normal range.  There was no evidence  for tricuspid  stenosis. There  was mild  regurgitation.     PULMONIC VALVE:  Leaflets exhibited  normal thickness,  no  calcification, and  normal cuspal separation.  Doppler:  The transpulmonic  velocity was  within the  normal range. There  was no regurgitation.     AORTA: The root  exhibited  normal  size.  PULMONARY ARTERY:  The size was normal.  Doppler: Systolic  pressure  was within  the normal range.  SYSTEMIC VEINS:  IVC: The inferior  vena cava was normal  in size.  PERICARDIUM: There  was no pericardial  effusion.        MRA of the  neck  CLINICAL HISTORY:  White matter disease  right facial  and arm  numbness.  Comments: MRA of  the neck was performed  with axial  2D time of flight  MRA  imaging and coronal  3-D gadolinium  enhanced MRA imaging.  The study  used  10 cc of gadavist.     The common carotid  arteries, carotid  bifurcations,  and internal  carotid  arteries are widely  patent bilaterally.  The vertebral  arteries are  widely patent as  well. There is no  stenosis.     IMPRESSION:  Normal neck MRA.  Laurena Slimmer, MD  10/04/2013 9:51 AM     Head MRA  3D time-of-flight  intracranial MRA  was performed, which  resulted in  good  visualization of  the upper cervical,  petrous, cavernous,  and  supraclinoid portions  of both carotid  arteries, as  well as the  basilar  artery. Anterior,  posterior and middle  cerebral arteries  are  visualized  to their second order  of branching,  and no evidence  of stenosis or  obvious occlusion  is seen.     IMPRESSION:  Negative intracranial  MRA.  Laurena Slimmer, MD  10/04/2013 9:49 AM        MRI BRAIN WO CONTRAST:10/04/2013  7:43  AM  CLINICAL HISTORY:  .arm /face numbness     COMPARISON: None.     FINDINGS:  Extra axial spaces:  Normal in size  and morphology for  the patient's  age.  Hemorrhage: None.  Ventricular system:  Normal in size  and morphology for  the patient's  age.  Basal cisterns: Normal.  Cerebral parenchyma:  There are several  punctate regions  of high T2  and  FLAIR signal intensity  seen in the  subcortical white  matter of the  frontal lobes bilaterally  without  underlying diffusion  restriction  or  mass effect or edema.  Midline shift: None.  Cerebellum: Normal.  Brainstem: Normal.     OTHER:  Calvarium: Unremarkable.  Incidental  note is made of  multiple regions  of  susceptibility overlying  the scalp  which may be in  the patient's  hair or  due to multiple  scalp calcifications  Vascular system:  Normal.  Visualized Paranasal  sinuses: Clear.  Visualized Orbits:   Normal.  Visualized upper  cervical spine:  Normal.  Sella and skull  base: Normal.     IMPRESSION:     Minimal nonspecific  bilateral frontal  white matter  changes without  mass  effect or edema  or acute infarct.  Otherwise unremarkable  Laurena Slimmer, MD  10/04/2013 9:48 AM     CBC - WBC: 4.31;  HB: 12.8; Hct: 38.4;  PLT: 238;  BMP - NA: 143;     K: 4.8;    BUN:  9;  Creatinine: 0.9;  Glucose: 86  Lipids - Total Cholesterol:  200;  Triglycerides: 75;  HDL: 44;  LDL: 141;     VLDL:  ;  Alk Phos: ;  ALT:  ;    AST:  TP: #  1 <0.01; #2  <0.01; #3: ; #4:  ; #5:  CK: #1 ; #2 ; #3:  ; #4: ; #5:  PT: ;  PTT: ; INR:  ; HbA1c: ;                    Pre Procedure  STS mortality risk  score was determined  and discussed  with the  patient All of the  patient's questions  were answered  to their  satisfaction.  Informed  consent was  obtained.     Description of Early  Procedure  The patient was  brought to the operating  room for  urgent surgery and  placed in the supine  position.  General  anesthesia  was induced.  All  monitoring lines  were placed percutaneously.   The  neck, chest, and  abdomen were prepped  and draped in  the usual sterile  fashion.  The  operative approach  in this case was  that of full sternotomy  and  cardiopulmonary  bypass.  The pericardium  was opened  and suspended.     Myocardial Protection  The method of delivery  of myocardial  protection was  that of cold  blood cardioplegic  solution, delivered  in antegrade  and retrograde  fashion. Cardioplegia  was delivered  through the aortic  root catheter  and through  the coronary  sinus catheter.   Reperfusion of  the  myocardium  was accomplished  with warm blood  cardioplegia  solution.  Myocardial  protection was  excellent.     Operative  Technique        The patient  was then fully  heparinized,  and the conduit  was prepared  for grafting.   Arterial  cannulation was  accomplished in  the  ascending  aorta using a  standard sized arterial   cannula.   Venous  cannulation  was accomplished  in the right  atrium using a  multi-  staged  venous cannula.   The patient was  connected to the  heart/lung  machine.  The cross clamp  was applied to  the ascending aorta  and  myocardial  arrest was achieved.   Coronary  bypass grafting  was  performed  as noted below.                                          Distal    Distal      Proximal                 Artery          AnastomosAnastomosisAnastomosis      Seq                                 is  SeqVessel  mm  Quality  ConduiSite       ConfiguratiConfiguration   Graf                          t                on  t                                 Obtuse     Left        excellenVein                          aorta-single  1          1.75                Marginal  End to side     Main        t        graft                        cross clamp                                 2                          In     Mid LAD     Left        excellen                              aorta-single  2          2.0          Situ             End to side     Main        t                                     cross clamp                          LIMA        Closing  Phase  After  careful maneuvers  to evacuate the  air, the cross clamp  was  removed.   The coronary  sinus catheter was  then removed.   Pacing  wires  were placed in the  atrium and the  ventricle.  After  adequate  rewarming,  the patient  was then weaned  from cardiopulmonary  bypass  using  no inotropes.  The  heparin was reversed  with protamine.  Cardiac  rhythm post-bypass  was normal sinus  rhythm.  The  patient was  decannulated,  and following  meticulous  hemostasis, the sternum  was  approximated  with interrupted  #5 stainless  steel wire while  the  linea  alba and fascia were  closed with Vicryl.   Hemodynamic  stability  was achieved  and the chest was  closed in layers  after  leaving  mediastinal, right,  and left pleural  tubes in place.    Sponge  and needle  counts were  correct and the  patient was transferred  to  CVICU  with stable vital  signs.  A message  was conveyed to  the family  that the  operation was  completed.  Additional Comments  The patient had  a small left internal  mammary artery  but it had very  good flow.  The  saphenous vein was  also small.           Tanda Rockers, M.D.    electronically  signed on 10/05/2013  2:04:41 PM  with status of Final

## 2013-10-05 NOTE — Progress Notes (Signed)
Post-op Note    Patient admitted directly to ICU from OR status post CABG x 2. Bedside rounds with Dr. DR. Tamsen Meek.Patient seen and examined.Patient admitted under general anesthesia, orally intubated, and receiving vasoactive support low dose levophed infusion- which was weaned off shortly after admission with stable hemodynamics.Patient sedated with propofol. Admission vital signs stable with an acceptable cardiac index, Heart rhythm Sinus Rhythm. Right IJ Theone Murdoch Catheter, Right IJ Triple Lumen Catheter, Epicardial Pacing Wires, Left Radial A-line, Mediastinal Chest Tube, Right Plueral Chest Tube and Left Plueral Chest Tube ,patent and secured, chest drains  Small, Serosanguinous, Air leak Negative. Overall, patient looks well. Surgical dressings Dry and Intact. Care continues per surgical protocols.  Insulin drip per [place] protocol.Foley Catheter draining adequate amounts of clear yellow urine Labs, radiographic studies and EKG reviewed with Dr. Tamsen Meek. Patient was extubated @1724  without difficulty. Family at bedside, questions and concerns addressed.    Medications:      Scheduled Meds: PRN Meds:      albumin human 12.5 g Intravenous Once   amiodarone 200 mg Oral Q12H SCH   aspirin 325 mg Oral Daily   atorvastatin 40 mg Oral QHS   cefuroxime 1.5 g Intravenous Q8H   chlorhexidine 15 mL Mouth/Throat Q12H SCH   famotidine 20 mg Oral Q12H SCH   fentaNYL      fluticasone 2 spray Each Nare Daily   fluticasone-salmeterol 1 puff Inhalation BID   mupirocin  Nasal Q12H SCH   nitroglycerin      NON-FORMULARY order form 0.25 mg Oral See Admin Instructions   norepinephrine (LEVOPHED) infusion- NON-SEPSIS 2 mcg/min Intravenous Once   PARoxetine 30 mg Oral QAM   phenylephrine 50-200 mcg/min Intravenous Once   potassium chloride      senna-docusate 1 tablet Oral QHS   sodium chloride (PF) 3 mL Intravenous Q8H   [START ON 10/07/2013] Vitamin D (Ergocalciferol) 50,000 Units Oral Weekly       Continuous Infusions:  . sodium  chloride 80 mL/hr at 10/05/13 1700   . insulin (regular) infusion     . lactated ringers 20 mL/hr at 10/05/13 0749      acetaminophen 650 mg Q4H PRN   acetaminophen-codeine 1 tablet Q6H PRN   bisacodyl 10 mg QD PRN   dextrose 25 mL PRN   fentaNYL 25 mcg Q1H PRN   hydrALAZINE 10 mg Q30 Min PRN   insulin aspart 3-6 Units TID MEALS PRN   insulin regular 2-12 Units PRN   lactated ringers  Continuous PRN   ondansetron 4 mg Q8H PRN   Or     ondansetron 4 mg Q8H PRN   oxyCODONE-acetaminophen 1 tablet Q4H PRN   oxyCODONE-acetaminophen 2 tablet Q4H PRN           Physical Exam:     VITAL SIGNS   Temp:  [96.8 F (36 C)-98.5 F (36.9 C)] 96.8 F (36 C)  Heart Rate:  [50-79] 71  Resp Rate:  [10-18] 17  BP: (91-144)/(51-94) 100/63 mmHg  Arterial Line BP: (93-140)/(55-97) 100/97 mmHg  FiO2:  [38 %-53 %] 39 %  Blood Glucose:  Pulse ox:  Telemetry:  Vent Settings  Vent Mode: SPONT  FiO2: 39 %  Resp Rate (Set): 10  Vt (Set, mL): 0.6 mL  PIP Observed (cm H2O): 9 cm H2O  PEEP/EPAP: 5 cm H20  Pressure Support / IPAP: 10 cmH20  Mean Airway Pressure: 9 cmH20    Intake/Output Summary (Last 24 hours) at 10/05/13 1755  Last data  filed at 10/05/13 1700   Gross per 24 hour   Intake   3012 ml   Output   1945 ml   Net   1067 ml      Invasive Hemodynamic Monitoring  Pulmonary Arterty Catheter (cm): 45 cm  PA Waveform: Appropriate wave forms  PAP: 26/13 mmHg  PAP (Mean): 18 mmHg  CVP (mmHg): 13 mmHg  PAWP (mmHg): 13 mmHg  CO (L/min): 4.8 L/min  CI (L/min/m2): 2.6 L/min/m2  SVR (dyne*sec)/cm5: 1332 (dyne*sec)/cm5  SVRI (dyne*sec)/cm5: 2484 (dyne*sec)/cm5  PVR (dyne*sec)/cm5: 67 (dyne*sec)/cm5  PVRI (dyne*sec)/cm5: 124 (dyne*sec)/cm5  SV (mL): 66.7 mL  LVSWI: 38.4  RVSWI: 2.4  Art Line  Arterial Line BP: 100/97 mmHg  Arterial Line MAP (mmHg): 98 mmHg  Pulse rate: 30 mmHg     IBW/kg (Calculated) : 57  Intake and Output Summary (Last 24 hours) at Date Time    Intake/Output Summary (Last 24 hours) at 10/05/13 1755  Last data filed at 10/05/13 1700    Gross per 24 hour   Intake   3012 ml   Output   1945 ml   Net   1067 ml       General appearance -looks well, talking appropriately to family,  in no distress  Neurological - Alert, oriented x3, MAE, speech soft Reports that post-op pain/dioscomfort controlled with prn narcotics Paxil resumed, calm and cooperative  Chest - clear to auscultation, no wheezes, rales or rhonchi, symmetric air entry, no tachypnea, retractions or cyanosis. Acceptable 02 satson NC therapy Admission chest xray acceptable. Chest drains acceptable amount of SS drainage noted, no air leak noted.   Heart - normal rate, regular rhythm, normal S1, S2, no murmurs, rubs, clicks or gallops. Epicardial wires secured. Sternotomy and leg dressing dry and intact Stable hemodynamics Pacing shortly after admission for bradycardia for brief period   Abdomen - soft, nontender, nondistended, no masses or organomegaly bowel sounds negative. Diet to be advanced  Extremities - peripheral pulses normal, no pedal edema, no clubbing or cyanosis  Skin - normal coloration and turgor, no rashes, surgical dressings dry and intact    Labs:       Recent Labs  Lab 10/05/13  1719   PH, ARTERIAL 7.369   PCO2, ARTERIAL 35.0   PO2, ARTERIAL 154.0*   HCO3, ARTERIAL 19.7*   BASE EXCESS, ARTERIAL -4.5*   O2 SAT, ARTERIAL 98.8         Recent Labs  Lab 10/05/13  1714 10/05/13  1417   WBC  --  13.22*   HEMOGLOBIN 11.0* 11.5*   HEMATOCRIT 33.8* 34.4*   PLATELETS  --  143         Recent Labs  Lab 10/05/13  1714 10/05/13  1417   SODIUM  --  139   POTASSIUM 4.2 3.8   CHLORIDE  --  110   CO2  --  20*   BUN  --  11.0   CREATININE  --  0.9   EGFR  --  >60.0   GLUCOSE  --  137*   CALCIUM  --  8.6       Rads:     Radiology Results (24 Hour)     Procedure Component Value Units Date/Time    X-ray chest AP portable [[id number]] Collected:  10/05/13 1440    Order Status:  Completed Updated:  10/05/13 1445    Narrative:      History: s/p cabg coronary artery bypass surgery coronary  artery  Technique: Single Portable View    Comparison:September 14    Findings:  Patchy atelectasis is seen in the mid lung zones bilaterally. Lines and  tubes are in good position  There is no pneumothorax.  The heart is normal in size.    The mediastinum is within normal limits.             Impression:       Patchy platelike atelectasis in each midlung  zone. Otherwise unremarkable    Laurena Slimmer, MD   10/05/2013 2:41 PM      XR Chest AP Portable [[id number]] Collected:  10/04/13 2221    Order Status:  Completed Updated:  10/04/13 2225    Narrative:      PORTABLE CHEST    CLINICAL STATEMENT: Coronary artery disease.    COMPARISON: The study is compared to a prior performed on 10/01/2013.    FINDINGS: The lungs are clear. The cardiomediastinal silhouette is  unremarkable.       Impression:        No acute cardiopulmonary disease.      Fonnie Mu, MD   10/04/2013 10:21 PM           Assessment/Plan:   Recovering well from surgery   Active Hospital Problems    Diagnosis POA   . Principal Problem: Chest pain of uncertain etiology Yes   . S/P CABG x 2 Not Applicable   . Elevated BP Yes   . Anxiety Yes   . Elevated coronary artery calcium score Yes   . Right arm pain Yes      Resolved Hospital Problems    Diagnosis POA   No resolved problems to display.         Neurological/?:   Awake, comfortable : Intact: No focal deficits   Pain Management: prn analgesia   Cardiovascular:   Remains VSS, afebrile  acceptable O2 sats on NC therapy  CAD s/p CABG: continue post-op surgical protocols, sternal precautions will advance to stepdown status later this evening  HYPERTENSION: Controlled off vasoactive support, will add BB in am  Core Measures: Aspirin - YES; Beta blocker - off levo will address in am; ACE -not indicated; Statin - YES, EF  55%  Pacing wires: YES    CI > 2.2  Anti-plt therapy: ASA Plt count acceptable -   Antiarrythmic therapy: continue amiodarone not started due to pre-op bradycardia   Pulmonary :    Atelectasis/pleural effusion: pulmonary hygiene, IS encouraged, early ambulation   Chest tubes (past 12 hours):  SS, no air leak noted- will [place]'d when appropriate  Chest X-ray: stable  Gastrointestinal:   Nutrition: To advance to Cardiac diet,   GI prophylaxis: Pepcid BID  Renal/GU:   Crea/ BUN relatively unchanged, I/O past 24 hours:   Foley to be [place]'d tomorrow  Hematological:   Anemia related to  surgical blood loss and dilutional- improving no indication for transfusion   Hematocrit:34.4  , Platelets:143  Antiplatelet therapy with aspirin,   DVT prophylaxis: SCD, heparin SQ after epicardial wires [place]'d  Infectious Disease:   Leukocytosis; WBC:13.22  , afebrile  Probably reactive leukocytosis- no clincal signs of infection  Lines: RIJ: TLC , RIJ SWAN: : LRAD ALINE: ,  Endocrinological:   Euglycemia: BS controlled doing well,not on insulin infusion  HgbA1c: 5.2  Disposition:             Care to be advance to [facility] Protocols  Patient updated, questions answered.              Case manager involved with discharge planning- possible discharge home when appropriate with visiting RN.      Nani Gasser, NP  10/05/2013 5:55 PM

## 2013-10-05 NOTE — Anesthesia Procedure Notes (Addendum)
TEE      Performed by: Tamala Julian    Authorized by: Tamala Julian      Procedure Info    Indication:  Myocardial Function and Surgeon Request  Surgery Class:  CABG  Examiner:  Tamala Julian  TEE CPT Codes:  [phone] Adult TEE with interpretation, 93320 Doppler ECHO and 11914 Doppler ECHO (Color Flow)  History of Esophageal Stricture?: No    History of Radiation Therapy?: No    Hemoptysis?: No    Probe Placement:  Bite Block used and Atraumatic  TEE Attestation:  MONITORING This TEE is used for monitoring purposes.  Please refer to written report for details of the exam.    Aorta    Asc Aortic Disease:  None  Desc Aortic Disease:  None  Coronary Dissection:  None    Pericardium    Pericardial Effusion:  None  Pericardial Thickening:  None    Left Ventricle    LViD Exceeds 9cm: No    LVH-Wall exceeds 1cm: No (1 cm)    Ejection Fraction:  Normal  LV Thrombus:  None  LV Septum:  Normal  Pre-surgery RWM Anterior wall:  Normal  Pre-surgery RWM Lateral Wall:  Normal  Pre-surgery RWM Septum:  Normal  Pre-surgery RWM Inferior Wall:  Normal  Pre-surgery RWM Apex:  Normal  Post-surgery RWM Anterior wall:  Normal  Post-surgery RWM Lateral Wall:  Normal  Post-surgery RWM Septum:  Normal  Post-surgery RWM Inferior Wall:  Normal  Post-surgery RWM Apex:  Normal    Left Atrium    Left Atrial Size:  Normal  Pulmonary Vein Flow:  None  Left Atrial Septum:  Normal  ASD:  None  LA Appendage:  Normal    Right Ventricle    RV Hypertrophy: No    RV Size:  Normal  RV Function:  Normal  RV Thrombus:  Normal    Right Atrium      Aortic Valve    Native AV Type:  Tricuspid  Native AV:  Normal    Aortic Perivalvular Leak: No    AR Grade:  None    Mitral Valve    Native Mitral Valve Type:  Normal  Native Mitral Valve:  Normal    MR Grade:  1+    Tricuspid Valve    Native Tricuspid Valve:  Normal  TR Grade:  1+    Intracardiac Masses

## 2013-10-05 NOTE — Brief Op Note (Signed)
BRIEF OP NOTE    Date Time: 10/05/2013 1:29 PM    Patient Name:   [name]    Date of Operation:   10/05/2013    Providers Performing:   Surgeon(s):  Serenah Mill, Carlena Hurl, MD    Assistant (s):   Circulator: Michael Boston, RN  Scrub Person: Ward, Roxanne, RN  First Assistant: Raymond Gurney, RN  Second Assistant: Dena Billet, RN    Operative Procedure:   Procedure(s):  CORONARY ARTERY BYPASS x 2: LIMA-LAD, SVG-OM2  ENDOSCOPIC,VEIN HARVEST  TEE    Preoperative Diagnosis:   Pre-Op Diagnosis Codes:     * Coronary artery disease     Postoperative Diagnosis:   Post-Op Diagnosis Codes:     * Coronary artery disease     Anesthesia:   General    Estimated Blood Loss:    500 mL    Implants:   * No implants in log *    Drains:   Drains: Yes, Drain #1, 2 and 3:  Chest Tube 28 Fr. Mediastinal, left and right pleural    Specimens:       Findings:   none    Complications:   none      Signed by: Annie Main, MD                                                                           [facility]

## 2013-10-05 NOTE — OR Nursing (Signed)
Patient seen in priop holding area. Verify all consents. The Mepilex pad applied to patient bottoks. Pt denies any unusual findings

## 2013-10-05 NOTE — Plan of Care (Signed)
Problem: Pre-op Phase- Cardiac Surgery  Goal: Patient will be ready for surgery  Outcome: Progressing  Pt CHG bathed night and day prior to surgery. clorohexadine mouthwash completed. Bactroban treatment completed. Mepelex applied to sacral area. All questions answered for pt and spouse. All orders verified. Blood bank and pharmacy advise are ready for surgery also at this time. Will continue to monitor pt at this time.

## 2013-10-05 NOTE — Progress Notes (Signed)
Patient arrived from unit alert and oriented, respirations even and unlabored, no complaints of pain Mepilex applied prior to arrival in SDS.   No belongings with patient.   Sinus brady on monitor

## 2013-10-05 NOTE — Progress Notes (Signed)
INTERNAL MEDICINE PROGRESS NOTE    Date: 10/05/2013  Patient Name:[name],[name] 53 y.o. female admitted with Chest pain of uncertain etiology  Patient status: Inpatient  Hospital Day: 4     Assessment:    CAD s/p cath and now s/p CABG   Acute respiratory failure s/p CABG on vent   Right arm pain- MRI/MRA negative   HTN   Anxiety.   Hyperlipidemia    Plan:    Appreciate neurology,cardiology and cardiac surgery input   D/w Dr.Bogar- cardiac surgery   Monitor in CVICU and management as per cardiac surgery   D/w patient family and discharge plan as per cardiac surgery once stable    Subjective:   Awake and on vent - seen in icu after cardiac surgery     Procedure :  10/04/13  PRECATHETERIZATION DIAGNOSIS:  A 53 year old female who presents for cardiac evaluation with substernal  chest pain, right-sided.  POSTCATHETERIZATION DIAGNOSES:  High-grade left main stenosis of 60% to 70%, ostial large, dominant  circumflex of 90%, proximal left anterior descending of 80%, small right  coronary artery, normal left ventricular systolic function, ejection  fraction 60%.  TITLE OF PROCEDURES:  Left heart catheterization, left and right coronary angiography, left  ventricular angiography, transradial approach.    10/05/13  Operative Procedure:    Procedure(s):  CORONARY ARTERY BYPASS x 2: LIMA-LAD, SVG-OM2  ENDOSCOPIC,VEIN HARVEST  TEE    Hospital problems:  Principal Problem:    Chest pain of uncertain etiology  Active Problems:    Elevated BP    Anxiety    Elevated coronary artery calcium score    Right arm pain      Medications:      Scheduled Meds: PRN Meds:        albumin human 12.5 g Intravenous Once   aminocaproic acid (AMICAR) infusion 20 g/250 mL (large) 1 g/hr Intravenous Once   aminocaproic acid (AMICAR) bolus  5 g/100 mL 5 g Intravenous Once   amiodarone 200 mg Oral Q12H SCH   arresting cardioplegia kcl 100 mEq/sodium chloride 0.9% 250 mL  Other Once   aspirin 325 mg Oral Daily   atorvastatin 40 mg Oral QHS    cefuroxime 1.5 g Intravenous Q8H   chlorhexidine 15 mL Mouth/Throat Q12H SCH   famotidine 20 mg Oral Q12H SCH   fentaNYL      fluticasone 2 spray Each Nare Daily   fluticasone-salmeterol 1 puff Inhalation BID   mupirocin  Nasal Q12H SCH   nitroglycerin      NON-FORMULARY order form 0.25 mg Oral See Admin Instructions   norepinephrine (LEVOPHED) infusion- NON-SEPSIS 2 mcg/min Intravenous Once   PARoxetine 30 mg Oral QAM   phenylephrine 50-200 mcg/min Intravenous Once   potassium chloride      VG (vein) solution (OR only)  Other Once   senna-docusate 1 tablet Oral QHS   sodium chloride (PF) 3 mL Intravenous Q8H   [START ON 10/07/2013] Vitamin D (Ergocalciferol) 50,000 Units Oral Weekly       Continuous Infusions:  . sodium chloride     . DOPamine     . EPINEPHrine infusion     . insulin (regular) infusion     . lactated ringers 20 mL/hr at 10/05/13 0749   . norepinephrine (LEVOPHED) infusion- NON-SEPSIS     . propofol        acetaminophen 650 mg Q4H PRN   acetaminophen 650 mg Q4H PRN   acetaminophen-codeine 1 tablet Q6H PRN   bisacodyl 10  mg QD PRN   dextrose 25 mL PRN   hydrALAZINE 10 mg Q30 Min PRN   insulin aspart 3-6 Units TID MEALS PRN   insulin regular 2-12 Units PRN   lactated ringers  Continuous PRN   ondansetron 4 mg Q8H PRN   Or     ondansetron 4 mg Q8H PRN   oxyCODONE-acetaminophen 1 tablet Q4H PRN   oxyCODONE-acetaminophen 2 tablet Q4H PRN           Review of Systems:   Unable to obtain as patient is on vent.  after the cardiac surgery     Physical Exam:   BP 100/67 mmHg  Pulse 65  Temp(Src) 96.8 F (36 C) (Axillary)  Resp 11  Ht 1.651 m (5\' 5" )  Wt 78.8 kg (173 lb 11.6 oz)  BMI 28.91 kg/m2  SpO2 100%  LMP  (LMP Unknown)    General appearance - awake on vent and open eyes spontaneously  HEENT: NC/AT, PERL b/l, ET tube in place  Neck - supple  Chest - dressing in place  Heart - normal S1 and S2 and regular rhythm  Abdomen - bowel sounds +ve, soft, non distended  Extremities - no pedal  edema  Neurological - Alert    Labs:       Recent Labs  Lab 10/05/13  1417 10/05/13  0551 10/01/13  1551   WBC 13.22* 5.24 4.38   HEMOGLOBIN 11.5* 14.5 12.8   HEMATOCRIT 34.4* 42.3 38.4   PLATELETS 143 262 238   MCV 94.5 93.8 93.4   NEUTROPHILS  --   --  41       Recent Labs  Lab 10/05/13  1417 10/05/13  0551 10/01/13  1551   SODIUM 139 138 143   POTASSIUM 3.8 4.5 4.8   CHLORIDE 110 107 108   CO2 20* 24 25   BUN 11.0 12.0 9.0   CREATININE 0.9 1.1* 0.9   GLUCOSE 137* 100 86   CALCIUM 8.6 9.9 9.5   MAGNESIUM 3.3*  --   --    PROTEIN, TOTAL  --   --  7.1   ALBUMIN  --   --  4.0   AST (SGOT)  --   --  26   ALT  --   --  18   ALKALINE PHOSPHATASE  --   --  74   BILIRUBIN, TOTAL  --   --  0.6     Glucose:      Recent Labs  Lab 10/05/13  1417 10/05/13  0551 10/01/13  1551   GLUCOSE 137* 100 86       Recent Labs  Lab 10/02/13  1046 10/02/13  0404 10/01/13  2341   TROPONIN I <0.01 <0.01 <0.01       Rads:   X-ray Chest Ap Portable    10/05/2013    Patchy platelike atelectasis in each midlung zone. Otherwise unremarkable  Laurena Slimmer, MD  10/05/2013 2:41 PM     Xr Chest Ap Portable    10/04/2013    No acute cardiopulmonary disease.   Fonnie Mu, MD  10/04/2013 10:21 PM       Christophe Louis, DO  Internal Medicine  10/05/2013

## 2013-10-06 ENCOUNTER — Inpatient Hospital Stay: Payer: BLUE CROSS/BLUE SHIELD

## 2013-10-06 LAB — BLOOD GAS, ARTERIAL
Arterial Total CO2: 20.4 mEq/L — ABNORMAL LOW (ref 24.0–30.0)
Arterial Total CO2: 21.1 mEq/L — ABNORMAL LOW (ref 24.0–30.0)
Arterial Total CO2: 21.3 mEq/L — ABNORMAL LOW (ref 24.0–30.0)
Arterial Total CO2: 22.2 mEq/L — ABNORMAL LOW (ref 24.0–30.0)
Arterial Total CO2: 24 mEq/L (ref 24.0–30.0)
Arterial Total CO2: 19.8 mEq/L — ABNORMAL LOW (ref 24.0–30.0)
Base Excess, Arterial: -0.1 mEq/L (ref <=2.0)
Base Excess, Arterial: -1.3 mEq/L (ref <=2.0)
Base Excess, Arterial: -0.3 mEq/L (ref <=2.0)
Base Excess, Arterial: -1.4 mEq/L (ref <=2.0)
Base Excess, Arterial: 0.1 mEq/L (ref <=2.0)
Base Excess, Arterial: -3.6 mEq/L — ABNORMAL LOW (ref <=2.0)
HCO3, Arterial: 22.8 mEq/L — ABNORMAL LOW (ref 23.0–29.0)
HCO3, Arterial: 23 mEq/L (ref 23.0–29.0)
HCO3, Arterial: 22.7 mEq/L — ABNORMAL LOW (ref 23.0–29.0)
HCO3, Arterial: 23.6 mEq/L (ref 23.0–29.0)
HCO3, Arterial: 25.3 mEq/L (ref 23.0–29.0)
HCO3, Arterial: 20.9 mEq/L — ABNORMAL LOW (ref 23.0–29.0)
O2 Sat, Arterial: 100.1 % — ABNORMAL HIGH (ref 95.0–100.0)
O2 Sat, Arterial: 100 % (ref 95.0–100.0)
O2 Sat, Arterial: 100.1 % — ABNORMAL HIGH (ref 95.0–100.0)
O2 Sat, Arterial: 71.9 % — ABNORMAL LOW (ref 95.0–100.0)
O2 Sat, Arterial: 99.9 % (ref 95.0–100.0)
O2 Sat, Arterial: 100.1 % — ABNORMAL HIGH (ref 95.0–100.0)
Temperature: 37
Temperature: 37
Temperature: 37
Temperature: 37
Temperature: 37
Temperature: 37
pCO2, Arterial: 32.5 mmhg — ABNORMAL LOW (ref 35.0–45.0)
pCO2, Arterial: 39.4 mmhg (ref 35.0–45.0)
pCO2, Arterial: 38.1 mmhg (ref 35.0–45.0)
pCO2, Arterial: 38.1 mmhg (ref 35.0–45.0)
pCO2, Arterial: 46.5 mmhg — ABNORMAL HIGH (ref 35.0–45.0)
pCO2, Arterial: 38 mmhg (ref 35.0–45.0)
pH, Arterial: 7.46 — ABNORMAL HIGH (ref 7.350–7.450)
pH, Arterial: 7.384 (ref 7.350–7.450)
pH, Arterial: 7.355 (ref 7.350–7.450)
pH, Arterial: 7.393 (ref 7.350–7.450)
pH, Arterial: 7.41 (ref 7.350–7.450)
pH, Arterial: 7.359 (ref 7.350–7.450)
pO2, Arterial: 472 mmhg — ABNORMAL HIGH (ref 80.0–90.0)
pO2, Arterial: 480 mmhg — ABNORMAL HIGH (ref 80.0–90.0)
pO2, Arterial: 290 mmhg — ABNORMAL HIGH (ref 80.0–90.0)
pO2, Arterial: 355 mmhg — ABNORMAL HIGH (ref 80.0–90.0)
pO2, Arterial: 39.6 mmhg — CL (ref 80.0–90.0)
pO2, Arterial: 476 mmhg — ABNORMAL HIGH (ref 80.0–90.0)

## 2013-10-06 LAB — ELECTROLYTES WHOLE BLOOD
Chloride, WB: 106 mEq/L (ref 98–106)
Chloride, WB: 107 mEq/L — ABNORMAL HIGH (ref 98–106)
Chloride, WB: 106 mEq/L (ref 98–106)
Chloride, WB: 106 mEq/L (ref 98–106)
Chloride, WB: 106 mEq/L (ref 98–106)
Chloride, WB: 107 mEq/L — ABNORMAL HIGH (ref 98–106)
Chloride, WB: 109 mEq/L — ABNORMAL HIGH (ref 98–106)
Whole Blood Glucose: 99 mg/dL (ref 70–100)
Whole Blood Glucose: 124 mg/dL — ABNORMAL HIGH (ref 70–100)
Whole Blood Glucose: 125 mg/dL — ABNORMAL HIGH (ref 70–100)
Whole Blood Glucose: 126 mg/dL — ABNORMAL HIGH (ref 70–100)
Whole Blood Glucose: 135 mg/dL — ABNORMAL HIGH (ref 70–100)
Whole Blood Glucose: 136 mg/dL — ABNORMAL HIGH (ref 70–100)
Whole Blood Glucose: 138 mg/dL — ABNORMAL HIGH (ref 70–100)
Whole Blood Potassium: 3.4 mEq/L — ABNORMAL LOW (ref 3.5–5.3)
Whole Blood Potassium: 3.7 mEq/L (ref 3.5–5.3)
Whole Blood Potassium: 5 mEq/L (ref 3.5–5.3)
Whole Blood Potassium: 5 mEq/L (ref 3.5–5.3)
Whole Blood Potassium: 5.4 mEq/L — ABNORMAL HIGH (ref 3.5–5.3)
Whole Blood Potassium: 5.5 mEq/L — ABNORMAL HIGH (ref 3.5–5.3)
Whole Blood Potassium: 4 mEq/L (ref 3.5–5.3)
Whole Blood Sodium: 140 mEq/L (ref 136–146)
Whole Blood Sodium: 138 mEq/L (ref 136–146)
Whole Blood Sodium: 135 mEq/L — ABNORMAL LOW (ref 136–146)
Whole Blood Sodium: 136 mEq/L (ref 136–146)
Whole Blood Sodium: 136 mEq/L (ref 136–146)
Whole Blood Sodium: 137 mEq/L (ref 136–146)
Whole Blood Sodium: 138 mEq/L (ref 136–146)

## 2013-10-06 LAB — COOXIMETRY PROFILE
Carboxyhemoglobin: 1.3 % (ref 0.0–3.0)
Carboxyhemoglobin: 1.1 % (ref 0.0–3.0)
Carboxyhemoglobin: 1.2 % (ref 0.0–3.0)
Carboxyhemoglobin: 1.2 % (ref 0.0–3.0)
Carboxyhemoglobin: 1.2 % (ref 0.0–3.0)
Carboxyhemoglobin: 1.3 % (ref 0.0–3.0)
Carboxyhemoglobin: 1.4 % (ref 0.0–3.0)
Hematocrit Total Calculated: 36.3 % — ABNORMAL LOW (ref 37.0–47.0)
Hematocrit Total Calculated: 34.9 % — ABNORMAL LOW (ref 37.0–47.0)
Hematocrit Total Calculated: 28.2 % — ABNORMAL LOW (ref 37.0–47.0)
Hematocrit Total Calculated: 29.3 % — ABNORMAL LOW (ref 37.0–47.0)
Hematocrit Total Calculated: 29.5 % — ABNORMAL LOW (ref 37.0–47.0)
Hematocrit Total Calculated: 30.1 % — ABNORMAL LOW (ref 37.0–47.0)
Hematocrit Total Calculated: 28.7 % — ABNORMAL LOW (ref 37.0–47.0)
Hemoglobin Total: 11.8 g/dL — ABNORMAL LOW (ref 12.0–16.0)
Hemoglobin Total: 11.3 g/dL — ABNORMAL LOW (ref 12.0–16.0)
Hemoglobin Total: 9.1 g/dL — ABNORMAL LOW (ref 12.0–16.0)
Hemoglobin Total: 9.4 g/dL — ABNORMAL LOW (ref 12.0–16.0)
Hemoglobin Total: 9.5 g/dL — ABNORMAL LOW (ref 12.0–16.0)
Hemoglobin Total: 9.7 g/dL — ABNORMAL LOW (ref 12.0–16.0)
Hemoglobin Total: 9.2 g/dL — ABNORMAL LOW (ref 12.0–16.0)
Methemoglobin: 1.4 % (ref 0.0–3.0)
Methemoglobin: 1.1 % (ref 0.0–3.0)
Methemoglobin: 1 % (ref 0.0–3.0)
Methemoglobin: 1.1 % (ref 0.0–3.0)
Methemoglobin: 1.1 % (ref 0.0–3.0)
Methemoglobin: 1.2 % (ref 0.0–3.0)
Methemoglobin: 1.6 % (ref 0.0–3.0)
O2 Content: 17.3
O2 Content: 16.8
O2 Content: 13.4
O2 Content: 13.8
O2 Content: 9.4
O2 Content: 9.6
O2 Content: 13.9
Oxygenated Hemoglobin: 97.4 % (ref 85.0–98.0)
Oxygenated Hemoglobin: 97.8 % (ref 85.0–98.0)
Oxygenated Hemoglobin: 70.3 % — ABNORMAL LOW (ref 85.0–98.0)
Oxygenated Hemoglobin: 70.3 % — ABNORMAL LOW (ref 85.0–98.0)
Oxygenated Hemoglobin: 97.6 % (ref 85.0–98.0)
Oxygenated Hemoglobin: 97.8 % (ref 85.0–98.0)
Oxygenated Hemoglobin: 97.1 % (ref 85.0–98.0)

## 2013-10-06 LAB — CREATININE WHOLE BLOOD
Whole Blood Creatinine: 0.9 mg/dL (ref 0.40–1.10)
Whole Blood Creatinine: 0.99 mg/dL (ref 0.40–1.10)
Whole Blood Creatinine: 0.92 mg/dL (ref 0.40–1.10)
Whole Blood Creatinine: 0.93 mg/dL (ref 0.40–1.10)
Whole Blood Creatinine: 0.94 mg/dL (ref 0.40–1.10)
Whole Blood Creatinine: 0.95 mg/dL (ref 0.40–1.10)
Whole Blood Creatinine: 0.92 mg/dL (ref 0.40–1.10)

## 2013-10-06 LAB — MAGNESIUM
Magnesium: 2.7 mg/dL — ABNORMAL HIGH (ref 1.6–2.6)
Magnesium: 2.1 mg/dL (ref 1.6–2.6)

## 2013-10-06 LAB — GLUCOSE WHOLE BLOOD - POCT
Whole Blood Glucose POCT: 141 mg/dL — ABNORMAL HIGH (ref 70–100)
Whole Blood Glucose POCT: 152 mg/dL — ABNORMAL HIGH (ref 70–100)
Whole Blood Glucose POCT: 135 mg/dL — ABNORMAL HIGH (ref 70–100)
Whole Blood Glucose POCT: 126 mg/dL — ABNORMAL HIGH (ref 70–100)
Whole Blood Glucose POCT: 135 mg/dL — ABNORMAL HIGH (ref 70–100)
Whole Blood Glucose POCT: 123 mg/dL — ABNORMAL HIGH (ref 70–100)
Whole Blood Glucose POCT: 132 mg/dL — ABNORMAL HIGH (ref 70–100)
Whole Blood Glucose POCT: 137 mg/dL — ABNORMAL HIGH (ref 70–100)
Whole Blood Glucose POCT: 116 mg/dL — ABNORMAL HIGH (ref 70–100)
Whole Blood Glucose POCT: 122 mg/dL — ABNORMAL HIGH (ref 70–100)
Whole Blood Glucose POCT: 111 mg/dL — ABNORMAL HIGH (ref 70–100)
Whole Blood Glucose POCT: 109 mg/dL — ABNORMAL HIGH (ref 70–100)
Whole Blood Glucose POCT: 139 mg/dL — ABNORMAL HIGH (ref 70–100)
Whole Blood Glucose POCT: 114 mg/dL — ABNORMAL HIGH (ref 70–100)

## 2013-10-06 LAB — CALCIUM, IONIZED
Calcium, Ionized: 2.17 mEq/L — ABNORMAL LOW (ref 2.30–2.58)
Calcium, Ionized: 2.25 mEq/L — ABNORMAL LOW (ref 2.30–2.58)
Calcium, Ionized: 2.08 mEq/L — ABNORMAL LOW (ref 2.30–2.58)
Calcium, Ionized: 2.12 mEq/L — ABNORMAL LOW (ref 2.30–2.58)
Calcium, Ionized: 2.15 mEq/L — ABNORMAL LOW (ref 2.30–2.58)
Calcium, Ionized: 2.17 mEq/L — ABNORMAL LOW (ref 2.30–2.58)
Calcium, Ionized: 2.99 mEq/L — ABNORMAL HIGH (ref 2.30–2.58)

## 2013-10-06 LAB — BASIC METABOLIC PANEL
Anion Gap: 4 — ABNORMAL LOW (ref 5.0–15.0)
BUN: 11 mg/dL (ref 7.0–19.0)
CO2: 24 mEq/L (ref 22–29)
Calcium: 8.1 mg/dL — ABNORMAL LOW (ref 8.5–10.5)
Chloride: 111 mEq/L (ref 100–111)
Creatinine: 0.9 mg/dL (ref 0.6–1.0)
Glucose: 156 mg/dL — ABNORMAL HIGH (ref 70–100)
Potassium: 4.9 mEq/L (ref 3.5–5.1)
Sodium: 139 mEq/L (ref 136–145)

## 2013-10-06 LAB — HEPATIC FUNCTION PANEL
ALT: 10 U/L (ref 0–55)
AST (SGOT): 30 U/L (ref 5–34)
Albumin/Globulin Ratio: 1.9 (ref 0.9–2.2)
Albumin: 3.7 g/dL (ref 3.5–5.0)
Alkaline Phosphatase: 47 U/L (ref 37–106)
Bilirubin Direct: 0.2 mg/dL (ref 0.0–0.5)
Bilirubin Indirect: 0.1 mg/dL (ref 0.0–1.0)
Bilirubin, Total: 0.3 mg/dL (ref 0.2–1.2)
Globulin: 2 g/dL (ref 2.0–3.6)
Protein, Total: 5.7 g/dL — ABNORMAL LOW (ref 6.0–8.3)

## 2013-10-06 LAB — LACTIC ACID, PLASMA
Lactic Acid: 0.6 mEq/L (ref 0.5–2.2)
Lactic Acid: 0.7 mEq/L (ref 0.5–2.2)
Lactic Acid: 0.7 mEq/L (ref 0.5–2.2)
Lactic Acid: 0.7 mEq/L (ref 0.5–2.2)
Lactic Acid: 1 mEq/L (ref 0.5–2.2)
Lactic Acid: 1.1 mEq/L (ref 0.5–2.2)
Lactic Acid: 1.3 mEq/L (ref 0.5–2.2)

## 2013-10-06 LAB — CBC
Hematocrit: 33.5 % — ABNORMAL LOW (ref 37.0–47.0)
Hgb: 10.5 g/dL — ABNORMAL LOW (ref 12.0–16.0)
MCH: 30.9 pg (ref 28.0–32.0)
MCHC: 31.3 g/dL — ABNORMAL LOW (ref 32.0–36.0)
MCV: 98.5 fL (ref 80.0–100.0)
MPV: 11 fL (ref 9.4–12.3)
Nucleated RBC: 0 /100 WBC (ref 0–1)
Platelets: 156 10*3/uL (ref 140–400)
RBC: 3.4 10*6/uL — ABNORMAL LOW (ref 4.20–5.40)
RDW: 13 % (ref 12–15)
WBC: 8.17 10*3/uL (ref 3.50–10.80)

## 2013-10-06 LAB — BLOOD GAS, VENOUS
Base Excess, Ven: -0.9 mEq/L
HCO3, Ven: 24.3 mEq/L
O2 Sat, Venous: 72.1 %
Temperature: 37
Venous Total CO2: 23 mEq/L
pCO2, Venous: 45.6 mmhg
pH, Ven: 7.346
pO2, Venous: 39.7 mmhg

## 2013-10-06 LAB — HEMOGLOBIN A1C: Hemoglobin A1C: 5.1 % (ref 0.0–6.0)

## 2013-10-06 LAB — HEMOLYSIS INDEX
Hemolysis Index: 27 — ABNORMAL HIGH (ref 0–18)
Hemolysis Index: 10 (ref 0–18)

## 2013-10-06 LAB — GFR: EGFR: >60

## 2013-10-06 LAB — POTASSIUM: Potassium: 4.3 mEq/L (ref 3.5–5.1)

## 2013-10-06 MED ORDER — FUROSEMIDE 10 MG/ML IJ SOLN
20.0000 mg | Freq: Once | INTRAMUSCULAR | Status: AC
Start: 2013-10-06 — End: 2013-10-06
  Administered 2013-10-06: 20 mg via INTRAVENOUS

## 2013-10-06 MED ORDER — HYDROMORPHONE HCL PF 1 MG/ML IJ SOLN
0.2500 mg | INTRAMUSCULAR | Status: DC | PRN
Start: 2013-10-06 — End: 2013-10-08
  Administered 2013-10-06 – 2013-10-07 (×4): 0.25 mg via INTRAVENOUS
  Filled 2013-10-06 (×4): qty 1

## 2013-10-06 MED ORDER — SODIUM CHLORIDE 0.9 % IV SOLN
1.0000 g | INTRAVENOUS | Status: AC
Start: 2013-10-06 — End: 2013-10-06
  Administered 2013-10-06 (×2): 1 g via INTRAVENOUS
  Filled 2013-10-06 (×2): qty 10

## 2013-10-06 MED ORDER — HYDROCODONE-ACETAMINOPHEN 5-325 MG PO TABS
1.0000 | ORAL_TABLET | Freq: Four times a day (QID) | ORAL | Status: DC | PRN
Start: 2013-10-06 — End: 2013-10-07
  Administered 2013-10-06 – 2013-10-07 (×2): 1 via ORAL
  Filled 2013-10-06 (×2): qty 1

## 2013-10-06 MED ORDER — BISACODYL 10 MG RE SUPP
10.0000 mg | Freq: Every day | RECTAL | Status: DC
Start: 2013-10-06 — End: 2013-10-09
  Administered 2013-10-06 – 2013-10-07 (×2): 10 mg via RECTAL
  Filled 2013-10-06 (×2): qty 1

## 2013-10-06 MED ORDER — METOPROLOL TARTRATE 25 MG PO TABS
12.5000 mg | ORAL_TABLET | Freq: Two times a day (BID) | ORAL | Status: DC
Start: 2013-10-06 — End: 2013-10-09
  Administered 2013-10-06 – 2013-10-09 (×7): 12.5 mg via ORAL
  Filled 2013-10-06 (×7): qty 1

## 2013-10-06 MED ORDER — HYDROCODONE-ACETAMINOPHEN 5-325 MG PO TABS
2.0000 | ORAL_TABLET | Freq: Four times a day (QID) | ORAL | Status: DC | PRN
Start: 2013-10-06 — End: 2013-10-07
  Administered 2013-10-06 – 2013-10-07 (×4): 2 via ORAL
  Filled 2013-10-06 (×4): qty 2

## 2013-10-06 MED ORDER — ALBUTEROL SULFATE 1.25 MG/3ML IN NEBU
1.2500 mg | INHALATION_SOLUTION | Freq: Four times a day (QID) | RESPIRATORY_TRACT | Status: DC | PRN
Start: 2013-10-06 — End: 2013-10-09
  Filled 2013-10-06: qty 3

## 2013-10-06 MED FILL — Mannitol IV Soln 25%: INTRAVENOUS | Qty: 50 | Status: AC

## 2013-10-06 MED FILL — Heparin Sodium (Porcine) Inj 5000 Unit/ML: INTRAMUSCULAR | Qty: 4 | Status: AC

## 2013-10-06 MED FILL — Lidocaine HCl IV Inj 20 MG/ML: INTRAVENOUS | Qty: 10 | Status: AC

## 2013-10-06 MED FILL — Magnesium Sulfate Inj 50%: INTRAMUSCULAR | Qty: 10 | Status: AC

## 2013-10-06 NOTE — Progress Notes (Signed)
INTERNAL MEDICINE PROGRESS NOTE    Date: 10/06/2013  Patient Name:[name],[name] 53 y.o. female admitted with Chest pain of uncertain etiology  Patient status: Inpatient  Hospital Day: 5     Assessment:    CAD s/p cath and now s/p CABG   Acute respiratory failure s/p CABG s/p intubation and extubation 10/05/13   Right arm pain- MRI/MRA negative   HTN but Bp in low range now   Anxiety.   Hyperlipidemia    Plan:    Appreciate pulmonary input   Pulmonary and cardiac surgery following    Monitor in CVICU and management as per cardiac surgery   Continue other management    Plan of care D/w patient, her husband and RN   Discharge plan as per cardiac surgery once stable    Subjective:   Awake and c/o pain at surgery site.     Procedure :  10/04/13  PRECATHETERIZATION DIAGNOSIS:  A 53 year old female who presents for cardiac evaluation with substernal  chest pain, right-sided.  POSTCATHETERIZATION DIAGNOSES:  High-grade left main stenosis of 60% to 70%, ostial large, dominant  circumflex of 90%, proximal left anterior descending of 80%, small right  coronary artery, normal left ventricular systolic function, ejection  fraction 60%.  TITLE OF PROCEDURES:  Left heart catheterization, left and right coronary angiography, left  ventricular angiography, transradial approach.    10/05/13  Operative Procedure:    Procedure(s):  CORONARY ARTERY BYPASS x 2: LIMA-LAD, SVG-OM2  ENDOSCOPIC,VEIN HARVEST  TEE    Hospital problems:  Principal Problem:    Chest pain of uncertain etiology  Active Problems:    Elevated BP    Anxiety    Elevated coronary artery calcium score    Right arm pain    S/P CABG x 2      Medications:      Scheduled Meds: PRN Meds:        albumin human 12.5 g Intravenous Once   aspirin 325 mg Oral Daily   atorvastatin 40 mg Oral QHS   bisacodyl 10 mg Rectal Daily   calcium GLUConate IVPB 1g 1 g Intravenous Q1H   cefuroxime 1.5 g Intravenous Q8H   famotidine 20 mg Oral Q12H SCH   fluticasone 2 spray Each Nare  Daily   fluticasone-salmeterol 1 puff Inhalation BID   metoprolol 12.5 mg Oral Q12H   mupirocin  Nasal Q12H [facility]   NON-FORMULARY order form 0.25 mg Oral See Admin Instructions   PARoxetine 30 mg Oral QAM   polyethylene glycol 17 g Oral Daily   senna-docusate 1 tablet Oral QHS   sodium chloride (PF) 3 mL Intravenous Q8H   [START ON 10/07/2013] Vitamin D (Ergocalciferol) 50,000 Units Oral Weekly       Continuous Infusions:  . sodium chloride 85 mL/hr at 10/06/13 0700   . insulin (regular) infusion 1 Units/hr (10/06/13 0700)   . lactated ringers 20 mL/hr at 10/05/13 0749      acetaminophen 650 mg Q4H PRN   acetaminophen-codeine 1 tablet Q6H PRN   bisacodyl 10 mg QD PRN   dextrose 25 mL PRN   fentaNYL 25 mcg Q1H PRN   hydrALAZINE 10 mg Q30 Min PRN   insulin aspart 3-6 Units TID MEALS PRN   insulin regular 2-12 Units PRN   lactated ringers  Continuous PRN   ondansetron 4 mg QD PRN   oxyCODONE-acetaminophen 1 tablet Q4H PRN   oxyCODONE-acetaminophen 2 tablet Q4H PRN  Review of Systems:   General: Fever/chills: Absent  HEENT: Negative for Headache,blurred vision,sore throat  Respiratory: Cough/ SOB- Absent  Cardiac: Chest wall pain +ve  Gastrointestinal: Nausea/vomiting/diarrhea/constipation-Absent, Abdominal pain- absent  Genitourinary: No burning micturition.   Musculoskeletal:Negative for - joint pain, muscular weakness or swelling   CNS/Neurological:No headache,weakness,blurred vision     Physical Exam:   BP 99/61 mmHg  Pulse 77  Temp(Src) 97.5 F (36.4 C) (Oral)  Resp 25  Ht 1.651 m (5\' 5" )  Wt 79.7 kg (175 lb 11.3 oz)  BMI 29.24 kg/m2  SpO2 100%  LMP  (LMP Unknown)    General appearance - awake and NAD  HEENT: NC/AT, PERL b/l, ET tube in place  Neck - supple  Chest - dressing in place, CTA b/l  Heart - normal S1 and S2 and regular rhythm  Abdomen - bowel sounds +ve, soft, non distended  Extremities - no pedal edema  Neurological - Alert    Labs:       Recent Labs  Lab 10/06/13  0313 10/05/13  1714  10/05/13  1417 10/05/13  0551 10/01/13  1551   WBC 8.17  --  13.22* 5.24 4.38   HEMOGLOBIN 10.5* 11.0* 11.5* 14.5 12.8   HEMATOCRIT 33.5* 33.8* 34.4* 42.3 38.4   PLATELETS 156  --  143 262 238   MCV 98.5  --  94.5 93.8 93.4   NEUTROPHILS  --   --   --   --  41       Recent Labs  Lab 10/06/13  0313 10/05/13  1714 10/05/13  1417 10/05/13  0551 10/01/13  1551   SODIUM 139  --  139 138 143   POTASSIUM 4.9 4.2 3.8 4.5 4.8   CHLORIDE 111  --  110 107 108   CO2 24  --  20* 24 25   BUN 11.0  --  11.0 12.0 9.0   CREATININE 0.9  --  0.9 1.1* 0.9   GLUCOSE 156*  --  137* 100 86   CALCIUM 8.1*  --  8.6 9.9 9.5   MAGNESIUM 2.7* 2.6 3.3*  --   --    PROTEIN, TOTAL 5.7*  --   --   --  7.1   ALBUMIN 3.7  --   --   --  4.0   AST (SGOT) 30  --   --   --  26   ALT 10  --   --   --  18   ALKALINE PHOSPHATASE 47  --   --   --  74   BILIRUBIN, TOTAL 0.3  --   --   --  0.6     Glucose:      Recent Labs  Lab 10/06/13  0313 10/05/13  1417 10/05/13  0551 10/01/13  1551   GLUCOSE 156* 137* 100 86       Recent Labs  Lab 10/02/13  1046 10/02/13  0404 10/01/13  2341   TROPONIN I <0.01 <0.01 <0.01       Rads:   X-ray Chest Ap Portable (daily)    10/06/2013    Mild bibasilar atelectasis  Laurena Slimmer, MD  10/06/2013 7:11 AM     X-ray Chest Ap Portable    10/05/2013    Patchy platelike atelectasis in each midlung zone. Otherwise unremarkable  Laurena Slimmer, MD  10/05/2013 2:41 PM       Christophe Louis, DO  Internal Medicine  10/06/2013

## 2013-10-06 NOTE — Anesthesia Postprocedure Evaluation (Signed)
Anesthesia Post Evaluation    Patient: [name]    Procedures performed: Procedure(s) with comments:  CORONARY ARTERY BYPASS - LIMA TO LAD   SVG TO OM2      ENDOSCOPIC,VEIN HARVEST  TEE - probe# [place] probe    Anesthesia type: General ETT    Patient location:ICU    Last vitals:   Filed Vitals:    10/06/13 0830   BP: 106/63   Pulse: 79   Temp:    Resp: 19   SpO2: 100%       Post pain: Patient : incisional pain, the percocet is not stronge enough. NP is going to order Dilaudid for hercomplaining of pain, continue current therapy      Mental Status:awake    Respiratory Function: tolerating room air    Cardiovascular: stable    Nausea/Vomiting: patient not complaining of nausea or vomiting    Hydration Status: adequate    Post assessment: no apparent anesthetic complications

## 2013-10-06 NOTE — Plan of Care (Signed)
Problem: Pain  Goal: Patient's pain/discomfort is manageable  Outcome: Progressing  IV fentanyl given for mid sternal discomfort and chest tube insertion discomfort. Pain assessment and reassessment done. Sternal precautions maintained. Patient turned and repositioned Q2H . HOB elevated 30 degrees

## 2013-10-06 NOTE — Plan of Care (Signed)
Problem: Post-op Cardiac Surgery  Goal: Maintain adequate oxygentation  Outcome: Progressing  Patient remains on cardiac monitor- monitor shows sinus rhythm, no ectopy noted. AV wires connected to pacer -pacer off. Chest tubes x 3 to y connector connected to atrium suction . -20 cm draining sanguinous drainage. Output , 50 cc/hour. Patient remains on nasal cannula at 2 l /min. o2 sats . 95 %. Chest sounds auscultated Q4H. Decreased a/e to bases. Incentive spirometry 500 ml . Needs to be reminded to use sternal precautions. Patient turned and repositioned q2h. HOB elevated 30 degrees. IV fentanyl given for chest tube and mid sternal incisional pain. O2 sats monitored continuously. Pain assessment and reassessment done. Chest tubes drainage measured hourly.

## 2013-10-06 NOTE — Progress Notes (Signed)
CARDIAC SURGERY PROGRESS NOTE    Date Time: 10/06/2013 4:46 PM  Patient Name: [name]  Attending Physician: Annie Main, MD  Procedure: CABG x 2 LIMA to LAD and SVG to OM2  POD #: 1      Subjective:   Patient seen and examined in her room. OOB in the chair with assistance. Chest tubes in place with moderate serous drainage. Pain medication changed to Vicodin for C/O nausea.    Medications:     Current Facility-Administered Medications   Medication Dose Route Frequency   . albumin human  12.5 g Intravenous Once   . aspirin  325 mg Oral Daily   . atorvastatin  40 mg Oral QHS   . bisacodyl  10 mg Rectal Daily   . famotidine  20 mg Oral Q12H SCH   . fluticasone  2 spray Each Nare Daily   . fluticasone-salmeterol  1 puff Inhalation BID   . metoprolol  12.5 mg Oral Q12H   . mupirocin   Nasal Q12H SCH   . PARoxetine  30 mg Oral QAM   . polyethylene glycol  17 g Oral Daily   . senna-docusate  1 tablet Oral QHS   . sodium chloride (PF)  3 mL Intravenous Q8H   . [START ON 10/07/2013] Vitamin D (Ergocalciferol)  50,000 Units Oral Weekly         Physical Exam:     Filed Vitals:    10/06/13 1500   BP:    Pulse:    Temp: 97.4 F (36.3 C)   Resp:    SpO2:          Intake and Output Summary (Last 24 hours) at Date Time    Intake/Output Summary (Last 24 hours) at 10/06/13 1646  Last data filed at 10/06/13 0700   Gross per 24 hour   Intake 2000.15 ml   Output   1566 ml   Net 434.15 ml       Chest Tube Drainage (Last 12 hours):   Neurological - alert, oriented, normal speech, no focal findings or movement disorder noted  Heart - normal rate, regular rhythm, normal S1, S2, no murmurs, clicks or gallops. + rub.   Chest - clear to auscultation, no wheezes, or rhonchi, symmetric air entry  Abdomen - soft, nontender, nondistended, no masses or organomegaly + BS  Extremities - peripheral pulses normal, no pedal edema, no clubbing or cyanosis  Inicision - OR dressing dry and intact.     Labs:     Recent CBC   Recent Labs       10/06/13   0313   RBC  3.40*   HEMOGLOBIN  10.5*   HEMATOCRIT  33.5*   MCV  98.5   MCH, POC  30.9   MCHC  31.3*   RDW  13   MPV  11.0     Recent CMP   Recent Labs      10/06/13   0313   GLUCOSE  156*   BUN  11.0   CREATININE  0.9   SODIUM  139   CO2  24   CALCIUM  8.1*   ALBUMIN  3.7   AST (SGOT)  30   ALT  10   GLOBULIN  2.0         Rads:   CXR:   Radiology Results (24 Hour)     Procedure Component Value Units Date/Time    X-ray chest AP portable (daily) [[id number]] Collected:  10/06/13  0710    Order Status:  Completed Updated:  10/06/13 0715    Narrative:      History:  Effusion    Technique: Single Portable View    Comparison: September 15    Findings:  There is minimal bibasilar atelectasis. Chest tubes remain in place.  Central venous catheter tip in SVC. Patient status post sternotomy.  Patient extubated and Swan-Ganz catheter and NG tube removed  There is no pneumothorax.  The heart is top normal in size    The mediastinum is within normal  limits.             Impression:       Mild bibasilar atelectasis    Laurena Slimmer, MD   10/06/2013 7:11 AM            Assessment / Plan     NEURO: Intact   Pain management with Vicodin and Dilaudid as needed to maintain pain scale  4/10.    PULM: Stable.    Patient followed by pulmonology   Nebs as prescribed.    IS and ambulate.     CV: Stable. SR 78. BP 99/58.    BB and increase once BP > 115 systolically   Amiodarone low dose start tomorrow, due to low heart rate. hold for HR less  then 60.   Normal EF, no ACE. Patient on Statin and antiplatelet agent.     GI: Stable.    GU: diurese and monitor daily weight.    Daily chest film until tubes removed.    Remove foley this evening at 2200.    HEM: Stable.    Start DVT prophylaxis once pacing wires removed.     ENDOCRINOLOGY: stable. Continue with [place] Insulin protocol.     ID: stable. Finish post op antibiotic course then discontinue. Daily CBC.    Deposition: Case management   PT and On   Home with family and visiting  Nurse    Signed by: Jacolyn Reedy     Discussed with:Dr. Aurora Mask

## 2013-10-06 NOTE — Progress Notes (Signed)
ICU Daily Progress Note        Date Time: 10/06/2013 8:15 AM  Patient Name: [name]  Attending Physician: Annie Main, MD  Room: [id number]   Admit Date: 10/01/2013  LOS: 5 days      ICU problem list: Denyse Amass artery disease status post coronary artery bypass grafting 2, acute respiratory failure, unstable angina.    Assessment:     . Coronary artery disease  . Acute respiratory failure.  Marland Kitchen Unstable angina  .  Asthma  . Hypertension    Plan:     . Status post coronary artery bypass grafting 2, continue with aspirin and beta blocker.  . Extubated yesterday, continue to wean off oxygen.  . Stable.  . Continue with Advair inhaler along with when necessary nebulizer.  . Blood pressure is fairly controlled.    Discussed with patient in detail, explained clinical condition and treatment plan.  Discussed with bedside nurse.  Start PT and OT.    Code Status: full code    Subjective:     Patient is awake, alert, seems comfortable.    Medications:      Scheduled Meds: PRN Meds:      albumin human 12.5 g Intravenous Once   aspirin 325 mg Oral Daily   atorvastatin 40 mg Oral QHS   bisacodyl 10 mg Rectal Daily   calcium GLUConate IVPB 1g 1 g Intravenous Q1H   cefuroxime 1.5 g Intravenous Q8H   famotidine 20 mg Oral Q12H SCH   fluticasone 2 spray Each Nare Daily   fluticasone-salmeterol 1 puff Inhalation BID   metoprolol 12.5 mg Oral Q12H   mupirocin  Nasal Q12H [facility]   NON-FORMULARY order form 0.25 mg Oral See Admin Instructions   PARoxetine 30 mg Oral QAM   polyethylene glycol 17 g Oral Daily   senna-docusate 1 tablet Oral QHS   sodium chloride (PF) 3 mL Intravenous Q8H   [START ON 10/07/2013] Vitamin D (Ergocalciferol) 50,000 Units Oral Weekly       Continuous Infusions:  . sodium chloride 85 mL/hr at 10/06/13 0700   . insulin (regular) infusion 1 Units/hr (10/06/13 0700)   . lactated ringers 20 mL/hr at 10/05/13 0749      acetaminophen 650 mg Q4H PRN   acetaminophen-codeine 1 tablet Q6H PRN   bisacodyl 10 mg QD PRN    dextrose 25 mL PRN   fentaNYL 25 mcg Q1H PRN   hydrALAZINE 10 mg Q30 Min PRN   insulin aspart 3-6 Units TID MEALS PRN   insulin regular 2-12 Units PRN   lactated ringers  Continuous PRN   ondansetron 4 mg QD PRN   oxyCODONE-acetaminophen 1 tablet Q4H PRN   oxyCODONE-acetaminophen 2 tablet Q4H PRN         Review Of System:     A comprehensive review of systems was: General ROS: negative for - chills, fever or night sweats  Ophthalmic ROS: negative for - double vision, excessive tearing, itchy eyes or photophobia  ENT ROS: negative for - headaches, nasal congestion, sore throat or vertigo  Respiratory ROS: negative for - cough, hemoptysis, orthopnea, , positive for chest pain are along the incision sites, no shortness of breath.    Cardiovascular ROS: negative for - edema, orthopnea, rapid heart rate or shortness of breath  Gastrointestinal ROS: negative for - abdominal pain, blood in stools, constipation, diarrhea, heartburn or nausea/vomiting  Neurological ROS: negative for - confusion, dizziness, headaches, speech problems, visual changes or weakness  Physical Exam:     General Appearance:  alert, and in no distress  Neuro:  alert, oriented, normal speech, no focal findings or movement disorder noted  Neck:supple, no significant adenopathy  Lungs: clear to auscultation, no wheezes, rales or rhonchi, symmetric air entry  Cardiac: normal rate and regular rhythm, S1 and S2 normal  Abdomen:  soft, nontender, nondistended, no masses or organomegaly  Extremities: no pedal edema, no clubbing or cyanosis, dressing on the left leg.  GU: foley    Data:     IBW/kg (Calculated) : 57      Active PICC Line / CVC Line / PIV Line / Drain / Airway / Intraosseous Line / Epidural Line / ART Line / Line / Wound / Pressure Ulcer / NG/OG Tube     Name:   Placement date:   Placement time:   Site:   Days:    CVC Triple Lumen 10/05/13 Right Internal jugular  10/05/13   0913   Internal jugular   less than 1    Peripheral IV 10/01/13  Right;Posterior Forearm  10/01/13   1549   Forearm   4    Peripheral IV 10/05/13 Right Forearm  10/05/13   0850   Forearm   less than 1    Chest Tube 1 Mediastinal 28 Fr.  10/05/13   1247   Mediastinal   less than 1    Chest Tube 2 Mediastinal 28 Fr.  10/05/13   1248   Mediastinal   less than 1    Chest Tube 3 Pericardial 28 Fr.  10/05/13   1249   Pericardial   less than 1    NG/OG Tube  10/05/13   0740      1    Urethral Catheter Double-lumen 16 Fr.  10/05/13   0910   Double-lumen   less than 1    Pacer Wires  10/05/13   1250   Atrial and Ventricular   less than 1    Incision Site 10/05/13 Chest  10/05/13   0957     less than 1    Incision Site 10/05/13 Leg Left  10/05/13   0957     less than 1          VITAL SIGNS   Temp:  [96.8 F (36 C)-98.7 F (37.1 C)] 97.5 F (36.4 C)  Heart Rate:  [61-84] 77  Resp Rate:  [10-102] 25  BP: (87-144)/(51-94) 99/61 mmHg  Arterial Line BP: (93-140)/(55-97) 99/59 mmHg  FiO2:  [38 %-53 %] 39 %  Blood Glucose:  Pulse ox:  Telemetry:      Intake/Output Summary (Last 24 hours) at 10/06/13 0815  Last data filed at 10/06/13 0700   Gross per 24 hour   Intake 4929.03 ml   Output   3321 ml   Net 1608.03 ml        Labs:     CBC w/Diff CMP     Recent Labs  Lab 10/06/13  0313 10/05/13  1714 10/05/13  1417 10/05/13  0551 10/01/13  1551   WBC 8.17  --  13.22* 5.24 4.38   HEMOGLOBIN 10.5* 11.0* 11.5* 14.5 12.8   HEMATOCRIT 33.5* 33.8* 34.4* 42.3 38.4   PLATELETS 156  --  143 262 238   MCV 98.5  --  94.5 93.8 93.4   NEUTROPHILS  --   --   --   --  41          PT/INR  Recent Labs  Lab 10/05/13  1417 10/04/13  2354   PT INR 1.3* 1.0         Recent Labs  Lab 10/06/13  0313 10/05/13  1714 10/05/13  1417 10/05/13  0551 10/01/13  1551   SODIUM 139  --  139 138 143   POTASSIUM 4.9 4.2 3.8 4.5 4.8   CHLORIDE 111  --  110 107 108   CO2 24  --  20* 24 25   BUN 11.0  --  11.0 12.0 9.0   CREATININE 0.9  --  0.9 1.1* 0.9   GLUCOSE 156*  --  137* 100 86   CALCIUM 8.1*  --  8.6 9.9 9.5   MAGNESIUM 2.7* 2.6  3.3*  --   --    PROTEIN, TOTAL 5.7*  --   --   --  7.1   ALBUMIN 3.7  --   --   --  4.0   AST (SGOT) 30  --   --   --  26   ALT 10  --   --   --  18   ALKALINE PHOSPHATASE 47  --   --   --  74   BILIRUBIN, TOTAL 0.3  --   --   --  0.6       Recent Labs  Lab 10/02/13  1046 10/02/13  0404 10/01/13  2341   TROPONIN I <0.01 <0.01 <0.01       @lababg @   Glucose POCT     Recent Labs  Lab 10/06/13  0313 10/05/13  1417 10/05/13  0551 10/01/13  1551   GLUCOSE 156* 137* 100 86        Rads:   Radiological Imaging personally reviewed.    I have personally reviewed the patient's history and 24 hour interval events, along with vitals, labs, radiology images.     So far today I have spent 35 minutes providing care for this patient excluding teaching and billable procedures, and not overlapping with any other providers.    Signed by: Lattie Haw, MD  Date/Time: 10/06/2013 8:15 AM

## 2013-10-06 NOTE — Plan of Care (Signed)
Problem: Post-op Cardiac Surgery  Goal: Adequate cardiac output  Outcome: Progressing  Patient remains on cardiac monitor- monitor shows sinus rhythm with no ectopy noted. BP stable. Patient step downed at 2110 hours. Patient tolerating clear fluids. Foley catheter draiining clear yellow urine. Out put > or equal to 30 ml /hour. AV wires to pacer pacer off. PPP. Patient remains on iv anitbiotics. Chest tubes to - 20 cm suction draining sanguinous drainage . Mid sternal and chest tube and svg dressings dry and intact . SCD's on . AM labs to be drawn at 0300 hours. PCXR  And 12 lead ekg to be done in am.

## 2013-10-07 LAB — CBC
Hematocrit: 32.3 % — ABNORMAL LOW (ref 37.0–47.0)
Hgb: 10.3 g/dL — ABNORMAL LOW (ref 12.0–16.0)
MCH: 31.3 pg (ref 28.0–32.0)
MCHC: 31.9 g/dL — ABNORMAL LOW (ref 32.0–36.0)
MCV: 98.2 fL (ref 80.0–100.0)
MPV: 11.5 fL (ref 9.4–12.3)
Nucleated RBC: 0 /100 WBC (ref 0–1)
Platelets: 153 10*3/uL (ref 140–400)
RBC: 3.29 10*6/uL — ABNORMAL LOW (ref 4.20–5.40)
RDW: 13 % (ref 12–15)
WBC: 7.81 10*3/uL (ref 3.50–10.80)

## 2013-10-07 LAB — GLUCOSE WHOLE BLOOD - POCT
Whole Blood Glucose POCT: 100 mg/dL (ref 70–100)
Whole Blood Glucose POCT: 104 mg/dL — ABNORMAL HIGH (ref 70–100)
Whole Blood Glucose POCT: 114 mg/dL — ABNORMAL HIGH (ref 70–100)
Whole Blood Glucose POCT: 121 mg/dL — ABNORMAL HIGH (ref 70–100)
Whole Blood Glucose POCT: 128 mg/dL — ABNORMAL HIGH (ref 70–100)
Whole Blood Glucose POCT: 129 mg/dL — ABNORMAL HIGH (ref 70–100)
Whole Blood Glucose POCT: 96 mg/dL (ref 70–100)
Whole Blood Glucose POCT: 97 mg/dL (ref 70–100)
Whole Blood Glucose POCT: 98 mg/dL (ref 70–100)

## 2013-10-07 LAB — BASIC METABOLIC PANEL
Anion Gap: 3 — ABNORMAL LOW (ref 5.0–15.0)
BUN: 9 mg/dL (ref 7.0–19.0)
CO2: 28 mEq/L (ref 22–29)
Calcium: 8.4 mg/dL — ABNORMAL LOW (ref 8.5–10.5)
Chloride: 105 mEq/L (ref 100–111)
Creatinine: 0.9 mg/dL (ref 0.6–1.0)
Glucose: 112 mg/dL — ABNORMAL HIGH (ref 70–100)
Potassium: 4.3 mEq/L (ref 3.5–5.1)
Sodium: 136 mEq/L (ref 136–145)

## 2013-10-07 LAB — MAGNESIUM: Magnesium: 2.4 mg/dL (ref 1.6–2.6)

## 2013-10-07 LAB — HEPATIC FUNCTION PANEL
ALT: 19 U/L (ref 0–55)
AST (SGOT): 35 U/L — ABNORMAL HIGH (ref 5–34)
Albumin/Globulin Ratio: 1.7 (ref 0.9–2.2)
Albumin: 3.3 g/dL — ABNORMAL LOW (ref 3.5–5.0)
Alkaline Phosphatase: 66 U/L (ref 37–106)
Bilirubin Direct: 0.3 mg/dL (ref 0.0–0.5)
Bilirubin Indirect: 0.1 mg/dL (ref 0.0–1.0)
Bilirubin, Total: 0.4 mg/dL (ref 0.2–1.2)
Globulin: 2 g/dL (ref 2.0–3.6)
Protein, Total: 5.3 g/dL — ABNORMAL LOW (ref 6.0–8.3)

## 2013-10-07 LAB — HEMOLYSIS INDEX: Hemolysis Index: 6 (ref 0–18)

## 2013-10-07 LAB — GFR: EGFR: >60

## 2013-10-07 LAB — PHOSPHORUS: Phosphorus: 2.4 mg/dL (ref 2.3–4.7)

## 2013-10-07 MED ORDER — INSULIN ASPART 100 UNIT/ML SC SOLN
1.0000 [IU] | Freq: Three times a day (TID) | SUBCUTANEOUS | Status: DC | PRN
Start: 2013-10-07 — End: 2013-10-08

## 2013-10-07 MED ORDER — AMIODARONE HCL 200 MG PO TABS
200.0000 mg | ORAL_TABLET | Freq: Two times a day (BID) | ORAL | Status: DC
Start: 2013-10-07 — End: 2013-10-09
  Administered 2013-10-07 – 2013-10-09 (×5): 200 mg via ORAL
  Filled 2013-10-07 (×5): qty 1

## 2013-10-07 MED ORDER — GLUCAGON 1 MG IJ SOLR (WRAP)
1.0000 mg | INTRAMUSCULAR | Status: DC | PRN
Start: 2013-10-07 — End: 2013-10-09

## 2013-10-07 MED ORDER — INSULIN ASPART 100 UNIT/ML SC SOLN
1.0000 [IU] | Freq: Every evening | SUBCUTANEOUS | Status: DC | PRN
Start: 2013-10-07 — End: 2013-10-08

## 2013-10-07 MED ORDER — HYDROCODONE-ACETAMINOPHEN 5-325 MG PO TABS
2.0000 | ORAL_TABLET | Freq: Four times a day (QID) | ORAL | Status: DC | PRN
Start: 2013-10-07 — End: 2013-10-08
  Administered 2013-10-08: 2 via ORAL
  Filled 2013-10-07: qty 2

## 2013-10-07 MED ORDER — DEXTROSE 50 % IV SOLN
25.0000 mL | INTRAVENOUS | Status: DC | PRN
Start: 2013-10-07 — End: 2013-10-09

## 2013-10-07 MED ORDER — POTASSIUM CHLORIDE CRYS ER 20 MEQ PO TBCR
20.0000 meq | EXTENDED_RELEASE_TABLET | Freq: Once | ORAL | Status: AC
Start: 2013-10-07 — End: 2013-10-07
  Administered 2013-10-07: 20 meq via ORAL
  Filled 2013-10-07: qty 1

## 2013-10-07 MED ORDER — HYDROCODONE-ACETAMINOPHEN 5-325 MG PO TABS
1.0000 | ORAL_TABLET | ORAL | Status: DC | PRN
Start: 2013-10-07 — End: 2013-10-08
  Administered 2013-10-08 (×4): 1 via ORAL
  Filled 2013-10-07 (×4): qty 1

## 2013-10-07 MED ORDER — FUROSEMIDE 10 MG/ML IJ SOLN
20.0000 mg | Freq: Once | INTRAMUSCULAR | Status: AC
Start: 2013-10-07 — End: 2013-10-07
  Administered 2013-10-07: 20 mg via INTRAVENOUS
  Filled 2013-10-07: qty 2

## 2013-10-07 NOTE — Progress Notes (Signed)
Date of Service: 10/07/2013    HPI/Events of Note: Uneventful  Cardiovascular Surgery Rounds with Dr.Bogar  Patient is S/P CABG X   2    POD#: 2  Pt seen and examined in his room. Discussed cae w/MD Bogar Recovering well from surgery.  Pt updated, questions answered.        Physical Exam:   BP 104/59 mmHg  Pulse 75  Temp(Src) 97.9 F (36.6 C) (Oral)  Resp 22  Ht 1.651 m (5\' 5" )  Wt 82 kg (180 lb 12.4 oz)  BMI 30.08 kg/m2  SpO2 98%  LMP  (LMP Unknown)    Hemodynamic parameters for last 24 hours:       Intake and Output Summary (Last 24 hours) at Date Time    Intake/Output Summary (Last 24 hours) at 10/07/13 1627  Last data filed at 10/07/13 1500   Gross per 24 hour   Intake 1545.42 ml   Output   3101 ml   Net -1555.58 ml       Intake/Output last 3 shifts:  I/O last 3 completed shifts:  In: 2682.49 [P.O.:940; I.V.:1692.49; IV Piggyback:50]  Out: 3028 [Urine:2190; Emesis/NG output:2; Chest Tube:836]          Physical Exam:     Appearance: NAD, non-toxic  Nutritional Status: Well Developed  Neuro: Mental Status: Awake  Affect: Calm/Appropriate  Orientation: X 3  GCS: Motor: 6, Verbal: 5, Eye: 4  Head/Neck:pupils equal and reactive, extraocular eye movements intact,  supple, no significant adenopathy  Airway:Not Intubated, Ausculation: Clear to auscultation no rales rhonchi or wheezes  Chest Tubes: moderate serous drainage   Cardiovascular:normal rate, regular rhythm, normal S1, S2, no murmurs, rubs, clicks or gallops, Pacing Wires: Capped & taped, DVT prophylaxis No wires intact  GI: Bowel Sounds: Present, Flautus Yes, N/V No,Last Bowel Movement: 09/06/13, Eating and tolerating: Cardiac Diet  GU: voids, Foley cath No  Extremities:peripheral pulses normal, no pedal edema, no clubbing or cyanosis  Skin: warm & dry, surgica lincisions are well approximated   Pt/ot consulted Yes    Labs:     Lab Results   Component Value Date    WBC 7.81 10/07/2013    HGB 10.3* 10/07/2013    HCT 32.3* 10/07/2013    MCV 98.2  10/07/2013    PLT 153 10/07/2013        Recent Labs  Lab 10/07/13  0228   SODIUM 136   POTASSIUM 4.3   CHLORIDE 105   CO2 28   BUN 9.0   CREATININE 0.9   EGFR >60.0   GLUCOSE 112*   CALCIUM 8.4*           Rads:     Medications:      Scheduled Meds: PRN Meds:      albumin human 12.5 g Intravenous Once   amiodarone 200 mg Oral Q12H   aspirin 325 mg Oral Daily   atorvastatin 40 mg Oral QHS   bisacodyl 10 mg Rectal Daily   fluticasone 2 spray Each Nare Daily   fluticasone-salmeterol 1 puff Inhalation BID   metoprolol 12.5 mg Oral Q12H   mupirocin  Nasal Q12H SCH   PARoxetine 30 mg Oral QAM   polyethylene glycol 17 g Oral Daily   senna-docusate 1 tablet Oral QHS   sodium chloride (PF) 3 mL Intravenous Q8H   Vitamin D (Ergocalciferol) 50,000 Units Oral Weekly       Continuous Infusions:  . sodium chloride 20 mL/hr at 10/06/13 2100   .  insulin (regular) infusion 0.5 Units/hr (10/07/13 1500)   . lactated ringers 20 mL/hr at 10/05/13 0749      acetaminophen 650 mg Q4H PRN   acetaminophen-codeine 1 tablet Q6H PRN   albuterol 1.25 mg Q6H PRN   bisacodyl 10 mg QD PRN   dextrose 25 mL PRN   fentaNYL 25 mcg Q1H PRN   hydrALAZINE 10 mg Q30 Min PRN   HYDROcodone-acetaminophen 1 tablet Q6H PRN   HYDROcodone-acetaminophen 2 tablet Q6H PRN   HYDROmorphone 0.25 mg Q2H PRN   insulin aspart 3-6 Units TID MEALS PRN   insulin regular 2-12 Units PRN   lactated ringers  Continuous PRN   ondansetron 4 mg QD PRN           Assessment:    CAD s/p CABG  Coronary artery disease  . Acute respiratory failure.  Marland Kitchen Unstable angina  . Asthma  . Hypertension    Plan:  Increase activity as tolerated  Cont. Insulin protocol  Monitoring ct's drainage, consider [place] in AM  [place] pacing wires in AM  Daily Wt  Cont. ASA, BB, lipid agents  Cont. Amiodarone protocol  Bowel regimine  Cont. diuresis  Monitoring and replacing electrolytes  Cont IS q 1 hr while awake  AV paced  Pacing wires secured  Smoking cessation advised  Reinforce Sternal Precautions    Discharge  planning evaluating for placement  D/c planning in progress  [facility] referral     Mar Daring, AGACNP

## 2013-10-07 NOTE — Plan of Care (Signed)
Problem: Post-op Cardiac Surgery  Goal: Adequate cardiac output  Outcome: Progressing  Patient remains on cardiac monitor- monitor shows sinus rhythm- no ectopy noed. BP stable. PPP. Patient alert and oriented x 4 spheres. Generalized edema. Chest tubes x 3  To suction draining serosanguinous- serous drainage.  Patient voided in toilet small amount at 2230 hour after foley was discontinued.  Heart sounds auscultated q4h.   AM labs drawn . Patient to be given iv lasix as per order.

## 2013-10-07 NOTE — Treatment Plan (Signed)
Severe Sepsis Screen  Date: 10/07/2013 Time: 10:57 PM  Nurse Signature: Blanch Stang T Angele Wiemann    A. Infection:    Does your patient have ONE or more of the following infection criteria?     []  Documented Infection - Does the patient have positive culture results (from blood,        sputum, urine, etc)?   []  Anti-Infective Therapy - Is the patient receiving antibiotic, antifungal, or other                anti-infective therapy?   []  Pneumonia - Is there documentation of pneumonia (X Ray, etc)?   []  WBC's - Have WBC's been found in normally sterile fluid (urine, CSF, etc.)?   []  Perforated Viscus -Does the patient have a perforated hollow organ (bowel)?    A.  Did you check any of the boxes above?    [x]  No  If No, Stop Here and Sepsis Screen Negative.               []  Yes, continue to section B      B. SIRS:     Does your patient have TWO or more of the following SIRS criteria (ensure vital signs & temperature are within 1 hour of this screening)?    []  Temperature - Is the patient's temperature: Temp: 98.9 F (37.2 C) (10/07/13 2100)   - Greater than or equal to 38.3 degrees C (greater than 100.9 degrees F)?   - Less than or equal to 36 degrees C (less than or equal to 96.8 degrees F)?    []  Heart Rate: Heart Rate: 73 (10/07/13 1500)   - Is the patient's heart rate greater than or equal to 90 bpm?    []  Respiratory: Resp Rate: 18 (10/07/13 2100)   - Is the patient's respiratory rate greater than or equal to 20?    []  WBC Count - Is the patient's WBC count:   Recent Labs  Lab 10/07/13  0228   WBC 7.81      - Greater than or equal to 12,000/mm3 OR   - Less than or equal to 4,000/mm3 OR    - Are there greater than 10% immature neutrophils (bands)?    []  Glucose >140 without diabetes?   Recent Labs  Lab 10/07/13  0228   GLUCOSE 112*       []  Significant edema?    B.  Did you check two or more of the boxes above?     []  No, Stop Here and Sepsis Screen Negative   []  Yes, continue to section C    C.  Acute Organ Dysfunction     Does  your patient have ONE or more of the following organ dysfunction? (May need to wait for lab results for assessment - see below) Organ dysfunction must be a result of the sepsis not chronic conditions.    []  Cardiovascular - Does the patient have a: BP: 102/59 mmHg (10/07/13 1500)   -systolic Blood pressure less than or equal to 90 mmHg OR   -systolic blood pressure has dropped 40 mmHg or more from baseline OR   -mean arterial pressure less than or equal to 70 mmHg (for at least one hour   despite fluid resuscitation OR require vasopressor support?  []  Respiratory - Does the patient have new hypoxia defined by any of the following"   -A sustained increase in oxygen requirements by at least 2L/min on NC or 28%    FiO2 within  the last 24 hrs OR   -A persistent decrease in oxygen saturation of greater than or equal to 5% lasting   at least four or more hours and occurring within the last 24 hours  []  Renal - Does the patient have:   -low urine output (e.g. Less than 0.67mL/kg/HR for one hour despite adequate fluid    resuscitation, OR   -Increased creatinine (greater than 50% increase from baseline) OR   -require acute dialysis?  []  Hematologic - Does the patient have a:   -Low platelet count (less than 100,000 mm3)   Recent Labs  Lab 10/07/13  0228   PLATELETS 153   OR   -INR/aPTT greater than upper limit of normal?  Recent Labs  Lab 10/05/13  1417   PT INR 1.3*    or                No results for input(s): APTT in the last 168 hours.  []  Metabolic - Does the patient have a high lactate (plasma lactate greater than or equal to 2.4 mMol/L)?   Recent Labs  Lab 10/05/13  1426 10/05/13  1254 10/05/13  1217 10/05/13  1213 10/05/13  1144   LACTIC ACID 1.2 1.3 1.1 1.0 0.7       []  Hepatic - Are the patient's liver enzymes elevated (ALT greater than 72 IU/L or Total      Bilirubin greater than 2 MG/dL)?    Recent Labs  Lab 10/07/13  0228   BILIRUBIN, TOTAL 0.4   ALT 19     []  CNS - Does the patient have altered consciousness or  reduced Glasgow Coma      Scale?    C.  Did you check any of the boxes above?     []  No, Sepsis Screen Negative   []  YES:  A) Infection + B) SIRS + C) Organ Dysfunction = Positive Screen for Severe Sepsis     Notify MD and document in Complex Assessment under provider notification   - Name of physician notified:                                         - Date/Time Notifiied:                                       Document actions: Following must be completed within 1 hour of positive sepsis screen   []  Lactate drawn (if initial lactate > , repeat lactate in 2 hours for goal decrease 10-20%)   []  Blood Cultures obtained (prior to antibiotic administration; if not done within the last 48 hours)   []  Antibiotics initiated or modified    []   IV Fluid administered 0.9% NS ___1000_______ mLs given (Initial Bolus of 30 ml/kg if SBP < 90 or MAP < 65 or lactate greater than 4 mmol/dl)  Nursing Comments?:     _________________________________________________________________    Patients meeting the following criteria are excluded from screening (check if applicable):   []  [place] hypothermia protocol   []  Comfort Care orders   []  Surgery- No screening for 24 hours after surgery (48 hours after CV surgery)       -If Yes. Date of surgery:______________________   []  Admitted with sepsis and until 72 hours after admission with documented  sepsis (RESUME SEPSIS SCREEN AFTER 72 hour window!!!)      -If Yes, Date of Documented Sepsis:______________________   _0  Positive screen AND Completed sepsis bundle during previous 24 hours       -If Yes, Date/Time of positive severe sepsis screen:_______________________

## 2013-10-07 NOTE — Progress Notes (Signed)
INTERNAL MEDICINE PROGRESS NOTE    Date: 10/07/2013  Patient Name:[name],[name] 53 y.o. female admitted with Chest pain of uncertain etiology  Patient status: Inpatient  Hospital Day: 6     Assessment:    CAD s/p cath and now s/p CABG   Acute respiratory failure s/p CABG s/p intubation and extubation 10/05/13   Right arm pain- MRI/MRA negative   HTN but Bp in low range now   Anxiety.   Hyperlipidemia    Plan:    Pulmonary and cardiac surgery following    Monitor in CVICU and management as per cardiac surgery   Continue other management    Plan of care D/w patient   Discharge plan as per cardiac surgery once stable    Subjective:   Awake and ambulated today.    Procedure :  10/04/13  PRECATHETERIZATION DIAGNOSIS:  A 53 year old female who presents for cardiac evaluation with substernal  chest pain, right-sided.  POSTCATHETERIZATION DIAGNOSES:  High-grade left main stenosis of 60% to 70%, ostial large, dominant  circumflex of 90%, proximal left anterior descending of 80%, small right  coronary artery, normal left ventricular systolic function, ejection  fraction 60%.  TITLE OF PROCEDURES:  Left heart catheterization, left and right coronary angiography, left  ventricular angiography, transradial approach.    10/05/13  Operative Procedure:    Procedure(s):  CORONARY ARTERY BYPASS x 2: LIMA-LAD, SVG-OM2  ENDOSCOPIC,VEIN HARVEST  TEE    Hospital problems:  Principal Problem:    Chest pain of uncertain etiology  Active Problems:    Elevated BP    Anxiety    Elevated coronary artery calcium score    Right arm pain    S/P CABG x 2      Medications:      Scheduled Meds: PRN Meds:        albumin human 12.5 g Intravenous Once   amiodarone 200 mg Oral Q12H   aspirin 325 mg Oral Daily   atorvastatin 40 mg Oral QHS   bisacodyl 10 mg Rectal Daily   fluticasone 2 spray Each Nare Daily   metoprolol 12.5 mg Oral Q12H   mupirocin  Nasal Q12H SCH   polyethylene glycol 17 g Oral Daily   senna-docusate 1 tablet Oral QHS   sodium  chloride (PF) 3 mL Intravenous Q8H   Vitamin D (Ergocalciferol) 50,000 Units Oral Weekly       Continuous Infusions:  . lactated ringers 20 mL/hr at 10/05/13 0749      acetaminophen 650 mg Q4H PRN   acetaminophen-codeine 1 tablet Q6H PRN   albuterol 1.25 mg Q6H PRN   bisacodyl 10 mg QD PRN   dextrose 25 mL PRN   fentaNYL 25 mcg Q1H PRN   glucagon (rDNA) 1 mg PRN   hydrALAZINE 10 mg Q30 Min PRN   HYDROcodone-acetaminophen 1 tablet Q6H PRN   HYDROcodone-acetaminophen 2 tablet Q6H PRN   HYDROmorphone 0.25 mg Q2H PRN   insulin aspart 1-3 Units QHS PRN   insulin aspart 1-5 Units TID AC PRN   lactated ringers  Continuous PRN   ondansetron 4 mg QD PRN           Review of Systems:   General: Fever/chills: Absent  HEENT: Negative for Headache,blurred vision,sore throat  Respiratory: Cough/ SOB- Absent  Cardiac: Chest wall pain +ve  Gastrointestinal: Nausea/vomiting/diarrhea/constipation-Absent, Abdominal pain- absent  Genitourinary: No burning micturition.   Musculoskeletal:Negative for - joint pain, muscular weakness or swelling   CNS/Neurological:No headache,weakness,blurred vision  Physical Exam:   BP 104/59 mmHg  Pulse 75  Temp(Src) 97.9 F (36.6 C) (Oral)  Resp 22  Ht 1.651 m (5\' 5" )  Wt 82 kg (180 lb 12.4 oz)  BMI 30.08 kg/m2  SpO2 98%  LMP  (LMP Unknown)    General appearance - awake and NAD  HEENT: NC/AT, PERL b/l, ET tube in place  Neck - supple  Chest - dressing in place, CTA b/l  Heart - normal S1 and S2 and regular rhythm  Abdomen - bowel sounds +ve, soft, non distended  Extremities - no pedal edema  Neurological - Alert    Labs:       Recent Labs  Lab 10/07/13  0228 10/06/13  0313 10/05/13  1714 10/05/13  1417  10/01/13  1551   WBC 7.81 8.17  --  13.22*  < > 4.38   HEMOGLOBIN 10.3* 10.5* 11.0* 11.5*  < > 12.8   HEMATOCRIT 32.3* 33.5* 33.8* 34.4*  < > 38.4   PLATELETS 153 156  --  143  < > 238   MCV 98.2 98.5  --  94.5  < > 93.4   NEUTROPHILS  --   --   --   --   --  41   < > = values in this interval  not displayed.    Recent Labs  Lab 10/07/13  0228 10/06/13  1957 10/06/13  0313  10/05/13  1417  10/01/13  1551   SODIUM 136  --  139  --  139  < > 143   POTASSIUM 4.3 4.3 4.9  < > 3.8  < > 4.8   CHLORIDE 105  --  111  --  110  < > 108   CO2 28  --  24  --  20*  < > 25   BUN 9.0  --  11.0  --  11.0  < > 9.0   CREATININE 0.9  --  0.9  --  0.9  < > 0.9   GLUCOSE 112*  --  156*  --  137*  < > 86   CALCIUM 8.4*  --  8.1*  --  8.6  < > 9.5   MAGNESIUM 2.4 2.1 2.7*  < > 3.3*  --   --    PHOSPHORUS 2.4  --   --   --   --   --   --    PROTEIN, TOTAL 5.3*  --  5.7*  --   --   --  7.1   ALBUMIN 3.3*  --  3.7  --   --   --  4.0   AST (SGOT) 35*  --  30  --   --   --  26   ALT 19  --  10  --   --   --  18   ALKALINE PHOSPHATASE 66  --  47  --   --   --  74   BILIRUBIN, TOTAL 0.4  --  0.3  --   --   --  0.6   < > = values in this interval not displayed.  Glucose:      Recent Labs  Lab 10/07/13  0228 10/06/13  0313 10/05/13  1417 10/05/13  0551 10/01/13  1551   GLUCOSE 112* 156* 137* 100 86       Recent Labs  Lab 10/02/13  1046 10/02/13  0404 10/01/13  2341   TROPONIN I <0.01 <0.01 <0.01  Rads:   No results found.    Christophe Louis, DO  Internal Medicine  10/07/2013

## 2013-10-07 NOTE — Plan of Care (Signed)
Problem: Pain  Goal: Patient's pain/discomfort is manageable  Outcome: Progressing  Patient receiving IV dilaudid and po analgesia for mid sternal and chest tube insertion discomfort. Pain assessment and reassessment done. Sternal precautions reinforced. Patient ambulated in unit x1 . Patient slept better tonight.

## 2013-10-07 NOTE — Plan of Care (Signed)
Problem: Post-op Cardiac Surgery  Goal: Maintain adequate oxygentation  Outcome: Progressing  Pt in RA maintaining sat mid 90 and above has Strong non productive cough. IS up to . CT draining moderate amount of serous fluid.   Goal: Remain free from post op complications  Outcome: Progressing  Free from post op complication.   Intervention: Maintain sternal precautions.  Pt is following sternal precaution most of the time. Reminded and taught the importance of following sternal precaution. Pt is compliant.

## 2013-10-07 NOTE — Treatment Plan (Signed)
Severe Sepsis Screen  Date: 10/07/2013 Time: 3:38 PM  Nurse Signature: Marce Schartz    A. Infection:    Does your patient have ONE or more of the following infection criteria?     []  Documented Infection - Does the patient have positive culture results (from blood,        sputum, urine, etc)?   []  Anti-Infective Therapy - Is the patient receiving antibiotic, antifungal, or other                anti-infective therapy?   []  Pneumonia - Is there documentation of pneumonia (X Ray, etc)?   []  WBC's - Have WBC's been found in normally sterile fluid (urine, CSF, etc.)?   []  Perforated Viscus -Does the patient have a perforated hollow organ (bowel)?    A.  Did you check any of the boxes above?    [x]  No  If No, Stop Here and Sepsis Screen Negative.               []  Yes, continue to section B      B. SIRS:     Does your patient have TWO or more of the following SIRS criteria (ensure vital signs & temperature are within 1 hour of this screening)?    []  Temperature - Is the patient's temperature: Temp: 97.9 F (36.6 C) (10/07/13 1500)   - Greater than or equal to 38.3 degrees C (greater than 100.9 degrees F)?   - Less than or equal to 36 degrees C (less than or equal to 96.8 degrees F)?    []  Heart Rate: Heart Rate: 75 (10/07/13 1400)   - Is the patient's heart rate greater than or equal to 90 bpm?    []  Respiratory: Resp Rate: 22 (10/06/13 2251)   - Is the patient's respiratory rate greater than or equal to 20?    []  WBC Count - Is the patient's WBC count:   Recent Labs  Lab 10/07/13  0228   WBC 7.81      - Greater than or equal to 12,000/mm3 OR   - Less than or equal to 4,000/mm3 OR    - Are there greater than 10% immature neutrophils (bands)?    []  Glucose >140 without diabetes?   Recent Labs  Lab 10/07/13  0228   GLUCOSE 112*       []  Significant edema?    B.  Did you check two or more of the boxes above?     []  No, Stop Here and Sepsis Screen Negative   []  Yes, continue to section C    C.  Acute Organ Dysfunction     Does  your patient have ONE or more of the following organ dysfunction? (May need to wait for lab results for assessment - see below) Organ dysfunction must be a result of the sepsis not chronic conditions.    []  Cardiovascular - Does the patient have a: BP: 104/59 mmHg (10/07/13 1210)   -systolic Blood pressure less than or equal to 90 mmHg OR   -systolic blood pressure has dropped 40 mmHg or more from baseline OR   -mean arterial pressure less than or equal to 70 mmHg (for at least one hour   despite fluid resuscitation OR require vasopressor support?  []  Respiratory - Does the patient have new hypoxia defined by any of the following"   -A sustained increase in oxygen requirements by at least 2L/min on NC or 28%    FiO2 within the  last 24 hrs OR   -A persistent decrease in oxygen saturation of greater than or equal to 5% lasting   at least four or more hours and occurring within the last 24 hours  []  Renal - Does the patient have:   -low urine output (e.g. Less than 0.38mL/kg/HR for one hour despite adequate fluid    resuscitation, OR   -Increased creatinine (greater than 50% increase from baseline) OR   -require acute dialysis?  []  Hematologic - Does the patient have a:   -Low platelet count (less than 100,000 mm3)   Recent Labs  Lab 10/07/13  0228   PLATELETS 153   OR   -INR/aPTT greater than upper limit of normal?  Recent Labs  Lab 10/05/13  1417   PT INR 1.3*    or                No results for input(s): APTT in the last 168 hours.  []  Metabolic - Does the patient have a high lactate (plasma lactate greater than or equal to 2.4 mMol/L)?   Recent Labs  Lab 10/05/13  1426 10/05/13  1254 10/05/13  1217 10/05/13  1213 10/05/13  1144   LACTIC ACID 1.2 1.3 1.1 1.0 0.7       []  Hepatic - Are the patient's liver enzymes elevated (ALT greater than 72 IU/L or Total      Bilirubin greater than 2 MG/dL)?    Recent Labs  Lab 10/07/13  0228   BILIRUBIN, TOTAL 0.4   ALT 19     []  CNS - Does the patient have altered consciousness or  reduced Glasgow Coma      Scale?    C.  Did you check any of the boxes above?     []  No, Sepsis Screen Negative   []  YES:  A) Infection + B) SIRS + C) Organ Dysfunction = Positive Screen for Severe Sepsis     Notify MD and document in Complex Assessment under provider notification   - Name of physician notified:                                         - Date/Time Notifiied:                                       Document actions: Following must be completed within 1 hour of positive sepsis screen   []  Lactate drawn (if initial lactate > , repeat lactate in 2 hours for goal decrease 10-20%)   []  Blood Cultures obtained (prior to antibiotic administration; if not done within the last 48 hours)   []  Antibiotics initiated or modified    []   IV Fluid administered 0.9% NS ___1000_______ mLs given (Initial Bolus of 30 ml/kg if SBP < 90 or MAP < 65 or lactate greater than 4 mmol/dl)  Nursing Comments?:     _________________________________________________________________    Patients meeting the following criteria are excluded from screening (check if applicable):   []  [place] hypothermia protocol   []  Comfort Care orders   []  Surgery- No screening for 24 hours after surgery (48 hours after CV surgery)       -If Yes. Date of surgery:______________________   []  Admitted with sepsis and until 72 hours after admission with documented sepsis (  RESUME SEPSIS SCREEN AFTER 72 hour window!!!)      -If Yes, Date of Documented Sepsis:______________________   _0  Positive screen AND Completed sepsis bundle during previous 24 hours       -If Yes, Date/Time of positive severe sepsis screen:_______________________

## 2013-10-07 NOTE — Progress Notes (Signed)
ICU Daily Progress Note        Date Time: 10/07/2013 8:28 AM  Patient Name: [name]  Attending Physician: Annie Main, MD  Room: [phone]   Admit Date: 10/01/2013  LOS: 6 days      ICU problem list: Denyse Amass artery disease status post coronary artery bypass grafting 2, acute respiratory failure, unstable angina.    Assessment:     . Coronary artery disease  . Acute respiratory failure.  Marland Kitchen Unstable angina  . Asthma  . Hypertension    Plan:     . Status post coronary artery bypass grafting 2, recovering well, continue amiodarone, aspirin, beta blocker and statin. . Stable, off oxygen.    . Status post coronary artery bypass grafting.   . Asthma is fairly controlled, patient is on Advair and when necessary nebulizer.  . Controlled on metoprolol.  . Still draining serous secretion from chest tube.    Discussed with patient in detail, explained clinical condition and treatment plan.  Discussed with bedside nurse.  Continue  PT and OT.    Code Status: full code    Subjective:     Patient is awake, alert, seems comfortable.    Medications:      Scheduled Meds: PRN Meds:        albumin human 12.5 g Intravenous Once   amiodarone 200 mg Oral Q12H   aspirin 325 mg Oral Daily   atorvastatin 40 mg Oral QHS   bisacodyl 10 mg Rectal Daily   famotidine 20 mg Oral Q12H SCH   fluticasone 2 spray Each Nare Daily   fluticasone-salmeterol 1 puff Inhalation BID   metoprolol 12.5 mg Oral Q12H   mupirocin  Nasal Q12H SCH   PARoxetine 30 mg Oral QAM   polyethylene glycol 17 g Oral Daily   senna-docusate 1 tablet Oral QHS   sodium chloride (PF) 3 mL Intravenous Q8H   Vitamin D (Ergocalciferol) 50,000 Units Oral Weekly       Continuous Infusions:  . sodium chloride 20 mL/hr at 10/06/13 2100   . insulin (regular) infusion 0.5 Units/hr (10/07/13 0806)   . lactated ringers 20 mL/hr at 10/05/13 0749      acetaminophen 650 mg Q4H PRN   acetaminophen-codeine 1 tablet Q6H PRN   albuterol 1.25 mg Q6H PRN   bisacodyl 10 mg QD PRN    dextrose 25 mL PRN   fentaNYL 25 mcg Q1H PRN   hydrALAZINE 10 mg Q30 Min PRN   HYDROcodone-acetaminophen 1 tablet Q6H PRN   HYDROcodone-acetaminophen 2 tablet Q6H PRN   HYDROmorphone 0.25 mg Q2H PRN   insulin aspart 3-6 Units TID MEALS PRN   insulin regular 2-12 Units PRN   lactated ringers  Continuous PRN   ondansetron 4 mg QD PRN         Review Of System:     A comprehensive review of systems was: General ROS: negative for - chills, fever or night sweats  Ophthalmic ROS: negative for - double vision, excessive tearing, itchy eyes or photophobia  ENT ROS: negative for - headaches, nasal congestion, sore throat or vertigo  Respiratory ROS: negative for - cough, hemoptysis, orthopnea, , positive for chest pain are along the incision sites, no shortness of breath.    Cardiovascular ROS: negative for - edema, orthopnea, rapid heart rate or shortness of breath  Gastrointestinal ROS: negative for - abdominal pain, blood in stools, constipation, diarrhea, heartburn or nausea/vomiting  Neurological ROS: negative for - confusion, dizziness, headaches, speech  problems, visual changes or weakness      Physical Exam:     General Appearance:  alert, and in no distress  Neuro:  alert, oriented, normal speech, no focal findings or movement disorder noted  Neck:supple, no significant adenopathy  Lungs: clear to auscultation, no wheezes, rales or rhonchi, symmetric air entry  Cardiac: normal rate and regular rhythm, S1 and S2 normal  Abdomen:  soft, nontender, nondistended, no masses or organomegaly  Extremities: no pedal edema, no clubbing or cyanosis, dressing on the left leg.      Data:     IBW/kg (Calculated) : 57      Active PICC Line / CVC Line / PIV Line / Drain / Airway / Intraosseous Line / Epidural Line / ART Line / Line / Wound / Pressure Ulcer / NG/OG Tube     Name:   Placement date:   Placement time:   Site:   Days:    CVC Triple Lumen 10/05/13 Right Internal jugular  10/05/13   0913   Internal jugular   less than 1     Peripheral IV 10/01/13 Right;Posterior Forearm  10/01/13   1549   Forearm   4    Peripheral IV 10/05/13 Right Forearm  10/05/13   0850   Forearm   less than 1    Incision Site 10/05/13 Chest  10/05/13   0957     less than 1    Incision Site 10/05/13 Leg Left  10/05/13   0957     less than 1          VITAL SIGNS   Temp:  [97.4 F (36.3 C)-98.6 F (37 C)] 98.1 F (36.7 C)  Heart Rate:  [72-83] 80  Resp Rate:  [12-35] 22  BP: (79-128)/(54-72) 128/68 mmHg  Blood Glucose:  Pulse ox:  Telemetry:      Intake/Output Summary (Last 24 hours) at 10/07/13 0828  Last data filed at 10/07/13 0700   Gross per 24 hour   Intake 981.92 ml   Output   1852 ml   Net -870.08 ml        Labs:     CBC w/Diff CMP     Recent Labs  Lab 10/07/13  0228 10/06/13  0313 10/05/13  1714 10/05/13  1417  10/01/13  1551   WBC 7.81 8.17  --  13.22*  < > 4.38   HEMOGLOBIN 10.3* 10.5* 11.0* 11.5*  < > 12.8   HEMATOCRIT 32.3* 33.5* 33.8* 34.4*  < > 38.4   PLATELETS 153 156  --  143  < > 238   MCV 98.2 98.5  --  94.5  < > 93.4   NEUTROPHILS  --   --   --   --   --  41   < > = values in this interval not displayed.       PT/INR     Recent Labs  Lab 10/05/13  1417 10/04/13  2354   PT INR 1.3* 1.0         Recent Labs  Lab 10/07/13  0228 10/06/13  1957 10/06/13  0313  10/05/13  1417  10/01/13  1551   SODIUM 136  --  139  --  139  < > 143   POTASSIUM 4.3 4.3 4.9  < > 3.8  < > 4.8   CHLORIDE 105  --  111  --  110  < > 108   CO2 28  --  24  --  20*  < > 25   BUN 9.0  --  11.0  --  11.0  < > 9.0   CREATININE 0.9  --  0.9  --  0.9  < > 0.9   GLUCOSE 112*  --  156*  --  137*  < > 86   CALCIUM 8.4*  --  8.1*  --  8.6  < > 9.5   MAGNESIUM 2.4 2.1 2.7*  < > 3.3*  --   --    PHOSPHORUS 2.4  --   --   --   --   --   --    PROTEIN, TOTAL 5.3*  --  5.7*  --   --   --  7.1   ALBUMIN 3.3*  --  3.7  --   --   --  4.0   AST (SGOT) 35*  --  30  --   --   --  26   ALT 19  --  10  --   --   --  18   ALKALINE PHOSPHATASE 66  --  47  --   --   --  74   BILIRUBIN, TOTAL 0.4  --   0.3  --   --   --  0.6   < > = values in this interval not displayed.    Recent Labs  Lab 10/02/13  1046 10/02/13  0404 10/01/13  2341   TROPONIN I <0.01 <0.01 <0.01       @lababg @   Glucose POCT     Recent Labs  Lab 10/07/13  0228 10/06/13  0313 10/05/13  1417 10/05/13  0551 10/01/13  1551   GLUCOSE 112* 156* 137* 100 86        Rads:   Radiological Imaging personally reviewed.    I have personally reviewed the patient's history and 24 hour interval events, along with vitals, labs, radiology images.     So far today I have spent 35 minutes providing care for this patient excluding teaching and billable procedures, and not overlapping with any other providers.    Signed by: Lattie Haw, MD  Date/Time: 10/07/2013 8:28 AM

## 2013-10-08 ENCOUNTER — Inpatient Hospital Stay: Payer: BLUE CROSS/BLUE SHIELD

## 2013-10-08 LAB — CBC
Hematocrit: 30.6 % — ABNORMAL LOW (ref 37.0–47.0)
Hgb: 9.9 g/dL — ABNORMAL LOW (ref 12.0–16.0)
MCH: 31.3 pg (ref 28.0–32.0)
MCHC: 32.4 g/dL (ref 32.0–36.0)
MCV: 96.8 fL (ref 80.0–100.0)
MPV: 11.2 fL (ref 9.4–12.3)
Nucleated RBC: 0 /100 WBC (ref 0–1)
Platelets: 164 10*3/uL (ref 140–400)
RBC: 3.16 10*6/uL — ABNORMAL LOW (ref 4.20–5.40)
RDW: 13 % (ref 12–15)
WBC: 6.25 10*3/uL (ref 3.50–10.80)

## 2013-10-08 LAB — HEPATIC FUNCTION PANEL
ALT: 22 U/L (ref 0–55)
AST (SGOT): 24 U/L (ref 5–34)
Albumin/Globulin Ratio: 1.6 (ref 0.9–2.2)
Albumin: 3.1 g/dL — ABNORMAL LOW (ref 3.5–5.0)
Alkaline Phosphatase: 56 U/L (ref 37–106)
Bilirubin Direct: 0.2 mg/dL (ref 0.0–0.5)
Bilirubin Indirect: 0.3 mg/dL (ref 0.0–1.0)
Bilirubin, Total: 0.5 mg/dL (ref 0.2–1.2)
Globulin: 2 g/dL (ref 2.0–3.6)
Protein, Total: 5.1 g/dL — ABNORMAL LOW (ref 6.0–8.3)

## 2013-10-08 LAB — GLUCOSE WHOLE BLOOD - POCT
Whole Blood Glucose POCT: 74 mg/dL (ref 70–100)
Whole Blood Glucose POCT: 105 mg/dL — ABNORMAL HIGH (ref 70–100)
Whole Blood Glucose POCT: 103 mg/dL — ABNORMAL HIGH (ref 70–100)
Whole Blood Glucose POCT: 104 mg/dL — ABNORMAL HIGH (ref 70–100)
Whole Blood Glucose POCT: 120 mg/dL — ABNORMAL HIGH (ref 70–100)

## 2013-10-08 LAB — BASIC METABOLIC PANEL
Anion Gap: 7 (ref 5.0–15.0)
BUN: 7 mg/dL (ref 7.0–19.0)
CO2: 29 mEq/L (ref 22–29)
Calcium: 8.3 mg/dL — ABNORMAL LOW (ref 8.5–10.5)
Chloride: 103 mEq/L (ref 100–111)
Creatinine: 0.8 mg/dL (ref 0.6–1.0)
Glucose: 100 mg/dL (ref 70–100)
Potassium: 3.9 mEq/L (ref 3.5–5.1)
Sodium: 139 mEq/L (ref 136–145)

## 2013-10-08 LAB — HEMOLYSIS INDEX: Hemolysis Index: 8 (ref 0–18)

## 2013-10-08 LAB — PHOSPHORUS: Phosphorus: 2.3 mg/dL (ref 2.3–4.7)

## 2013-10-08 LAB — GFR: EGFR: >60

## 2013-10-08 LAB — MAGNESIUM: Magnesium: 2.3 mg/dL (ref 1.6–2.6)

## 2013-10-08 NOTE — Progress Notes (Addendum)
ICU Daily Progress Note        Date Time: 10/08/2013 10:30 AM  Patient Name: [name]  Attending Physician: Annie Main, MD  Room: [phone]   Admit Date: 10/01/2013  LOS: 7 days      ICU problem list: Denyse Amass artery disease status post coronary artery bypass grafting 2, acute respiratory failure, unstable angina.    Assessment:     Coronary artery disease  Asthma  Hypertension  HLD  Anemia    Plan:   CABG x2 POD 3 cont asa amiodarone statin and BB.   Advair  BB  Statin  H/h stable no indications for transfusion     Continue  PT and OT and progress per CV surgery team    Code Status: full code    Subjective:     Patient is awake, alert, seems comfortable. Medial-stinal tubes removed today. Patient up in chair ambulatory    Medications:      Scheduled Meds: PRN Meds:   Amiodarone 200 mg daily  Aspirin 325 mg daily  Lipitor 40 mg daily  Lopressor 12.5 mg BID  Dulcolax, mrialax and pericolace  Continuous along with norco given for pain   acetaminophen 650 mg Q4H PRN   acetaminophen-codeine 1 tablet Q6H PRN   albuterol 1.25 mg Q6H PRN   bisacodyl 10 mg QD PRN   dextrose 25 mL PRN   fentaNYL 25 mcg Q1H PRN   glucagon (rDNA) 1 mg PRN   hydrALAZINE 10 mg Q30 Min PRN   HYDROcodone-acetaminophen 1 tablet Q4H PRN   HYDROcodone-acetaminophen 2 tablet Q6H PRN   HYDROmorphone 0.25 mg Q2H PRN   insulin aspart 1-3 Units QHS PRN   insulin aspart 1-5 Units TID AC PRN   ondansetron 4 mg QD PRN         Physical Exam:     General Appearance:  alert, and in no distress  Neuro:  alert, oriented, normal speech, no focal findings or movement disorder noted  Neck:supple, no significant adenopathy  Lungs: clear to auscultation, no wheezes, rales or rhonchi, symmetric air entry  Cardiac: normal rate and regular rhythm, S1 and S2 normal, no MRG  Abdomen:  soft, nontender, nondistended, no masses or organomegaly, + bowel sounds noted  Extremities: + pedal edema L>R, left had vein removed for cabg. Sites well approximated, no  clubbing or cyanosis, dressing on the left leg. Midsternal dressing cdi  GU voids      Data:     IBW/kg (Calculated) : 57      Active PICC Line / CVC Line / PIV Line / Drain / Airway / Intraosseous Line / Epidural Line / ART Line / Line / Wound / Pressure Ulcer / NG/OG Tube     Name:   Placement date:   Placement time:   Site:   Days:    CVC Triple Lumen 10/05/13 Right Internal jugular  10/05/13   0913   Internal jugular   less than 1    Peripheral IV 10/01/13 Right;Posterior Forearm  10/01/13   1549   Forearm   4    Peripheral IV 10/05/13 Right Forearm  10/05/13   0850   Forearm   less than 1    Incision Site 10/05/13 Chest  10/05/13   0957     less than 1    Incision Site 10/05/13 Leg Left  10/05/13   0957     less than 1          VITAL  SIGNS   Temp:  [97.3 F (36.3 C)-99.3 F (37.4 C)] 98.4 F (36.9 C)  Heart Rate:  [72-80] 73  Resp Rate:  [18] 18  BP: (93-130)/(58-70) 130/60 mmHg  Blood Glucose:  Pulse ox:  Telemetry:sinus      Intake/Output Summary (Last 24 hours) at 10/08/13 1030  Last data filed at 10/08/13 0500   Gross per 24 hour   Intake  662.5 ml   Output   1230 ml   Net -567.5 ml        Labs:     CBC w/Diff CMP     Recent Labs  Lab 10/08/13  0324 10/07/13  0228 10/06/13  0313  10/01/13  1551   WBC 6.25 7.81 8.17  < > 4.38   HEMOGLOBIN 9.9* 10.3* 10.5*  < > 12.8   HEMATOCRIT 30.6* 32.3* 33.5*  < > 38.4   PLATELETS 164 153 156  < > 238   MCV 96.8 98.2 98.5  < > 93.4   NEUTROPHILS  --   --   --   --  41   < > = values in this interval not displayed.       PT/INR     Recent Labs  Lab 10/05/13  1417 10/04/13  2354   PT INR 1.3* 1.0         Recent Labs  Lab 10/08/13  0324 10/07/13  0228 10/06/13  1957 10/06/13  0313   SODIUM 139 136  --  139   POTASSIUM 3.9 4.3 4.3 4.9   CHLORIDE 103 105  --  111   CO2 29 28  --  24   BUN 7.0 9.0  --  11.0   CREATININE 0.8 0.9  --  0.9   GLUCOSE 100 112*  --  156*   CALCIUM 8.3* 8.4*  --  8.1*   MAGNESIUM 2.3 2.4 2.1 2.7*   PHOSPHORUS 2.3 2.4  --   --    PROTEIN, TOTAL 5.1*  5.3*  --  5.7*   ALBUMIN 3.1* 3.3*  --  3.7   AST (SGOT) 24 35*  --  30   ALT 22 19  --  10   ALKALINE PHOSPHATASE 56 66  --  47   BILIRUBIN, TOTAL 0.5 0.4  --  0.3       Recent Labs  Lab 10/02/13  1046 10/02/13  0404 10/01/13  2341   TROPONIN I <0.01 <0.01 <0.01       @lababg @   Glucose POCT     Recent Labs  Lab 10/08/13  0324 10/07/13  0228 10/06/13  0313 10/05/13  1417 10/05/13  0551 10/01/13  1551   GLUCOSE 100 112* 156* 137* 100 86        Rads:   Radiological Imaging personally reviewed.    I have personally reviewed the patient's history and 24 hour interval events, along with vitals, labs, radiology images.     So far today I have spent 35 minutes providing care for this patient excluding teaching and billable procedures, and not overlapping with any other providers.    Signed by: Esmond Camper, MD  Date/Time: 10/08/2013 10:30 AM

## 2013-10-08 NOTE — Progress Notes (Signed)
Date of Service: 10/08/2013    HPI/Events of Note:   Cardiovascular Surgery Rounds with Dr. Thedore Mins  Patient is S/P CABG X  2     Surgeon: Dr. Tamsen Meek    POD#: 3  Pt seen and examined in his room.  Recovering well from surgery. Pacing wires and ct's d/c'd today wo difficulty. Pt ambulates and have a shower today. Pt updated, questions answered.      Physical Exam:   BP 111/68 mmHg  Pulse 74  Temp(Src) 97.7 F (36.5 C) (Oral)  Resp 18  Ht 1.651 m (5\' 5" )  Wt 80.3 kg (177 lb 0.5 oz)  BMI 29.46 kg/m2  SpO2 98%  LMP  (LMP Unknown)       Intake and Output Summary (Last 24 hours) at Date Time    Intake/Output Summary (Last 24 hours) at 10/08/13 1840  Last data filed at 10/08/13 0500   Gross per 24 hour   Intake    370 ml   Output   1140 ml   Net   -770 ml       Intake/Output last 3 shifts:  I/O last 3 completed shifts:  In: 1365.21 [P.O.:1060; I.V.:305.21]  Out: 3170 [Urine:2500; Chest Tube:670]      Physical Exam:     Appearance: Ill  Nutritional Status: Well Developed  Neuro: Mental Status: Awake  Affect: Calm/Appropriate  Orientation: X 3   Motor:   Head/Neck:pupils equal and reactive, extraocular eye movements intact,  supple, no significant adenopathy  Airway:Not Intubated, Ausculation: Clear to auscultation,  No Wheezing,  Chest Tubes: x3, no air leak, small amount of serous drainage. Ct's d/c'd today, without difficulty   Cardiovascular:normal rate, regular rhythm, normal S1, S2, no murmurs, rubs, clicks or gallops, Pacing Wires: d/c'd today without difficulty, DVT prophylaxis Yes  GI: Bowel Sounds: Present, Flautus Yes, N/V No,Last Bowel Movement: yesterday, Eating and tolerating: Consistant Carbohydrate Diet  GU: yellow urine, Foley cath Yes  Extremities:peripheral pulses normal, no pedal edema, no clubbing or cyanosis  Skin: sternotomy well approximated  Pt/ot consulted Yes    Labs:     Lab Results   Component Value Date    WBC 6.25 10/08/2013    HGB 9.9* 10/08/2013    HCT 30.6* 10/08/2013    MCV 96.8  10/08/2013    PLT 164 10/08/2013        Recent Labs  Lab 10/08/13  0324   SODIUM 139   POTASSIUM 3.9   CHLORIDE 103   CO2 29   BUN 7.0   CREATININE 0.8   EGFR >60.0   GLUCOSE 100   CALCIUM 8.3*      Rads:   Findings:  There is patchy opacity at both lung bases characteristic of  atelectasis.  There is no pneumothorax.  There is mild cardiomegaly. The aorta appears tortuous, a finding  usually associated with either atherosclerosis or systemic hypertension.      Central venous catheter is in good position. Bilateral chest tubes are  in situ.    IMPRESSION:   Mild bibasilar atelectasis    Laurena Slimmer, MD   10/08/2013 8:33 AM    Medications:      Scheduled Meds: PRN Meds:      albumin human 12.5 g Intravenous Once   amiodarone 200 mg Oral Q12H   aspirin 325 mg Oral Daily   atorvastatin 40 mg Oral QHS   bisacodyl 10 mg Rectal Daily   fluticasone 2 spray Each Nare Daily   metoprolol 12.5  mg Oral Q12H   mupirocin  Nasal Q12H SCH   polyethylene glycol 17 g Oral Daily   senna-docusate 1 tablet Oral QHS   sodium chloride (PF) 3 mL Intravenous Q8H   Vitamin D (Ergocalciferol) 50,000 Units Oral Weekly       Continuous Infusions:      acetaminophen 650 mg Q4H PRN   acetaminophen-codeine 1 tablet Q6H PRN   albuterol 1.25 mg Q6H PRN   bisacodyl 10 mg QD PRN   dextrose 25 mL PRN   fentaNYL 25 mcg Q1H PRN   glucagon (rDNA) 1 mg PRN   hydrALAZINE 10 mg Q30 Min PRN   HYDROcodone-acetaminophen 1 tablet Q4H PRN   HYDROcodone-acetaminophen 2 tablet Q6H PRN   HYDROmorphone 0.25 mg Q2H PRN   insulin aspart 1-3 Units QHS PRN   insulin aspart 1-5 Units TID AC PRN   ondansetron 4 mg QD PRN           Assessment/Plan:    CAD s/p CABG x 2, Hyperglycemia, Asthma and Hypertension    Neurological:  Pain management: continue prn analgesia with numeric goal 2, scale 1 - 10  No neuro deficits    Cardiovascular:  CAD s/p CABG x2: continue post op protocols, reinforce sternal precautions  Daily ASA  Prophylactic Antiarrhythmia therapy: amiodarone  protocol  Daily Wt  Increase activity as tolerate  Core Measures: ASA y, BB y, Statin y, ACEI n    Pulmonary:   Chest tubes: x 3, mild amount of serous drainage, without air leak, monitoring ct's   drainage, d/c'd today wo difficulty  CXR daily: mild bibasilar atelectasis  Cont flonase and advair  F/u by pulmonologist  Breathing Exercise IS q 1 hr while awake    Gastrointestinal:  Nutrition: advance diet as tolerated, after extubation, tolerating diet  GI prophylaxis: ppi  Cont. Stool softener; BM yesterday    Renal/GU:  Hypo/Hyper kalemia: replete K as needed  Monitoring renal function: creatinine: 0.8  , EGFR > 60    Hematological:   DVT prophylaxis: SCD    Infectious Disease:  Leukocytosis r/t stess response; WBC , afebrile 97.7   Lines: RIJ TLC from 9 /15 / 15      Endocrinological:   Hyperglycemia: continue insulin  per protocol  Hgb A1C 5.2   Diabetes education    Disposition:  Continue ICU post op surgery protocols  Pt/Ot consulted  D/c planning in progress  VNA referral   Possible d/c home in AM        [place], ACNP

## 2013-10-08 NOTE — PT Eval Note (Signed)
[facility]  Physical Therapy Attempt Note    Patient:  [name] MRN#:  [id number]  Unit:  [place] [facility] Room/Bed:  V4098/J1914-78       Physical Therapy Eval attempted on 10/08/2013, unable to complete secondary to Pt eating lunch, requests PT to return at later time.      Jilda Panda PT DPT  [phone]  10/08/2013 1:11 PM

## 2013-10-08 NOTE — Plan of Care (Signed)
Problem: Pain  Goal: Patient's pain/discomfort is manageable  Intervention: Include patient/family/caregiver in decisions related to pain management  Adequate pain relief with one hydrocodone every 4 hour apart.  Pt able to ambulate with husband and bathroom  Without difficulty.      Problem: Psychosocial and Spiritual Needs  Goal: Demonstrates ability to cope with hospitalization/illness  Outcome: Progressing  Pt coping with current condition well.    Problem: Post-op Cardiac Surgery  Goal: Remain free from post op complications  Outcome: Progressing  No fever noted. Adhered to splinting chest ,  Call for assist.  Does incentive spirometer exercise while awake.

## 2013-10-08 NOTE — Plan of Care (Signed)
Problem: Pain  Goal: Patient's pain/discomfort is manageable  Outcome: Progressing  Medicated as needed for pain and also back rubs in adjunct to medication and is effective.    Problem: Post-op Cardiac Surgery  Goal: Remain free from post op complications  Outcome: Progressing  No complications at present and patient following instructions well.

## 2013-10-08 NOTE — Treatment Plan (Signed)
Severe Sepsis Screen  Date: 10/08/2013 Time: 9:57 PM  Nurse Signature: Modesto Charon    A. Infection:    Does your patient have ONE or more of the following infection criteria?     []  Documented Infection - Does the patient have positive culture results (from blood,        sputum, urine, etc)?   []  Anti-Infective Therapy - Is the patient receiving antibiotic, antifungal, or other                anti-infective therapy?   []  Pneumonia - Is there documentation of pneumonia (X Ray, etc)?   []  WBC's - Have WBC's been found in normally sterile fluid (urine, CSF, etc.)?   []  Perforated Viscus -Does the patient have a perforated hollow organ (bowel)?    A.  Did you check any of the boxes above?    [x]  No  If No, Stop Here and Sepsis Screen Negative.               []  Yes, continue to section B      B. SIRS:     Does your patient have TWO or more of the following SIRS criteria (ensure vital signs & temperature are within 1 hour of this screening)?    []  Temperature - Is the patient's temperature: Temp: 98.2 F (36.8 C) (10/08/13 2000)   - Greater than or equal to 38.3 degrees C (greater than 100.9 degrees F)?   - Less than or equal to 36 degrees C (less than or equal to 96.8 degrees F)?    []  Heart Rate: Heart Rate: 73 (10/08/13 2000)   - Is the patient's heart rate greater than or equal to 90 bpm?    []  Respiratory: Resp Rate: 16 (10/08/13 2000)   - Is the patient's respiratory rate greater than or equal to 20?    []  WBC Count - Is the patient's WBC count:   Recent Labs  Lab 10/08/13  0324   WBC 6.25      - Greater than or equal to 12,000/mm3 OR   - Less than or equal to 4,000/mm3 OR    - Are there greater than 10% immature neutrophils (bands)?    []  Glucose >140 without diabetes?   Recent Labs  Lab 10/08/13  0324   GLUCOSE 100       []  Significant edema?    B.  Did you check two or more of the boxes above?     []  No, Stop Here and Sepsis Screen Negative   []  Yes, continue to section C    C.  Acute Organ Dysfunction      Does your patient have ONE or more of the following organ dysfunction? (May need to wait for lab results for assessment - see below) Organ dysfunction must be a result of the sepsis not chronic conditions.    []  Cardiovascular - Does the patient have a: BP: 114/78 mmHg (10/08/13 2000)   -systolic Blood pressure less than or equal to 90 mmHg OR   -systolic blood pressure has dropped 40 mmHg or more from baseline OR   -mean arterial pressure less than or equal to 70 mmHg (for at least one hour   despite fluid resuscitation OR require vasopressor support?  []  Respiratory - Does the patient have new hypoxia defined by any of the following"   -A sustained increase in oxygen requirements by at least 2L/min on NC or 28%    FiO2 within  the last 24 hrs OR   -A persistent decrease in oxygen saturation of greater than or equal to 5% lasting   at least four or more hours and occurring within the last 24 hours  []  Renal - Does the patient have:   -low urine output (e.g. Less than 0.5mL/kg/HR for one hour despite adequate fluid    resuscitation, OR   -Increased creatinine (greater than 50% increase from baseline) OR   -require acute dialysis?  []  Hematologic - Does the patient have a:   -Low platelet count (less than 100,000 mm3)   Recent Labs  Lab 10/08/13  0324   PLATELETS 164   OR   -INR/aPTT greater than upper limit of normal?  Recent Labs  Lab 10/05/13  1417   PT INR 1.3*    or                No results for input(s): APTT in the last 168 hours.  []  Metabolic - Does the patient have a high lactate (plasma lactate greater than or equal to 2.4 mMol/L)?   Recent Labs  Lab 10/05/13  1426 10/05/13  1254 10/05/13  1217 10/05/13  1213 10/05/13  1144   LACTIC ACID 1.2 1.3 1.1 1.0 0.7       []  Hepatic - Are the patient's liver enzymes elevated (ALT greater than 72 IU/L or Total      Bilirubin greater than 2 MG/dL)?    Recent Labs  Lab 10/08/13  0324   BILIRUBIN, TOTAL 0.5   ALT 22     []  CNS - Does the patient have altered  consciousness or reduced Glasgow Coma      Scale?    C.  Did you check any of the boxes above?     []  No, Sepsis Screen Negative   []  YES:  A) Infection + B) SIRS + C) Organ Dysfunction = Positive Screen for Severe Sepsis     Notify MD and document in Complex Assessment under provider notification   - Name of physician notified:                                         - Date/Time Notifiied:                                       Document actions: Following must be completed within 1 hour of positive sepsis screen   []  Lactate drawn (if initial lactate > , repeat lactate in 2 hours for goal decrease 10-20%)   []  Blood Cultures obtained (prior to antibiotic administration; if not done within the last 48 hours)   []  Antibiotics initiated or modified    []   IV Fluid administered 0.9% NS ___1000_______ mLs given (Initial Bolus of 30 ml/kg if SBP < 90 or MAP < 65 or lactate greater than 4 mmol/dl)  Nursing Comments?:     _________________________________________________________________    Patients meeting the following criteria are excluded from screening (check if applicable):   []  [place] hypothermia protocol   []  Comfort Care orders   []  Surgery- No screening for 24 hours after surgery (48 hours after CV surgery)       -If Yes. Date of surgery:______________________   []  Admitted with sepsis and until 72 hours after admission with documented  sepsis (RESUME SEPSIS SCREEN AFTER 72 hour window!!!)      -If Yes, Date of Documented Sepsis:______________________   _0  Positive screen AND Completed sepsis bundle during previous 24 hours       -If Yes, Date/Time of positive severe sepsis screen:_______________________

## 2013-10-08 NOTE — Plan of Care (Signed)
Problem: Physical Therapy  Goal: Patient condition is improving per Physical Therapy Treatment Plan  Outcome: Progressing  Discharge Recommendation: Home with no needs (Cardiac Rehab when recommended by MD)       Treatment/Interventions: Bed mobility  PT Frequency: follow-up visit only (to assess bed mobility if needed)     Early/Progressive Mobility Protocol Level: Step 8: Ambulate in Hallway (2x a day with assistance)  (Please See Therapy Evaluation for device and assistance level needed)    Goals:   Goals  Goal Formulation: With patient  Time for Goal Acheivement: By time of discharge  Goals: Select goal  Pt Will Go Supine To Sit: independent  Pt Will Perform Sit to Stand: independent  Pt Will Demo Standing Balance: 5/5 indep, moves/returns center of grav in all planes > 2"

## 2013-10-08 NOTE — Plan of Care (Signed)
Problem: Post-op Cardiac Surgery  Goal: Adequate cardiac output  Outcome: Progressing  SR on the monitor, no ectopy, BP WNL, Good urine output.

## 2013-10-08 NOTE — Progress Notes (Signed)
INTERNAL MEDICINE PROGRESS NOTE    Date: 10/08/2013  Patient Name:[name],[name] 53 y.o. female admitted with Chest pain of uncertain etiology  Patient status: Inpatient  Hospital Day: 7     Assessment:    CAD s/p cath and now s/p CABG   Chest wall pain sec to cabg - stable    Acute respiratory failure s/p CABG s/p intubation and extubation 10/05/13   Right arm pain- MRI/MRA negative   HTN stable    Anxiety.   Hyperlipidemia    Plan:    Continue to monitor in CVICU and management as per cardiac surgery   Continue other management    [place] plan as per cardiac surgery in 1-2 days d/w NP cardiac surgery    Plan of care explained to patient   Discharge plan as per cardiac surgery once stable    Subjective:   Awake, ambulated and had shower today.    Procedure :  10/04/13  PRECATHETERIZATION DIAGNOSIS:  A 53 year old female who presents for cardiac evaluation with substernal  chest pain, right-sided.  POSTCATHETERIZATION DIAGNOSES:  High-grade left main stenosis of 60% to 70%, ostial large, dominant  circumflex of 90%, proximal left anterior descending of 80%, small right  coronary artery, normal left ventricular systolic function, ejection  fraction 60%.  TITLE OF PROCEDURES:  Left heart catheterization, left and right coronary angiography, left  ventricular angiography, transradial approach.    10/05/13  Operative Procedure:    Procedure(s):  CORONARY ARTERY BYPASS x 2: LIMA-LAD, SVG-OM2  ENDOSCOPIC,VEIN HARVEST  TEE    Hospital problems:  Principal Problem:    Chest pain of uncertain etiology  Active Problems:    Elevated BP    Anxiety    Elevated coronary artery calcium score    Right arm pain    S/P CABG x 2      Medications:      Scheduled Meds: PRN Meds:        albumin human 12.5 g Intravenous Once   amiodarone 200 mg Oral Q12H   aspirin 325 mg Oral Daily   atorvastatin 40 mg Oral QHS   bisacodyl 10 mg Rectal Daily   fluticasone 2 spray Each Nare Daily   metoprolol 12.5 mg Oral Q12H   mupirocin  Nasal Q12H SCH    polyethylene glycol 17 g Oral Daily   senna-docusate 1 tablet Oral QHS   sodium chloride (PF) 3 mL Intravenous Q8H   Vitamin D (Ergocalciferol) 50,000 Units Oral Weekly       Continuous Infusions:      acetaminophen 650 mg Q4H PRN   acetaminophen-codeine 1 tablet Q6H PRN   albuterol 1.25 mg Q6H PRN   bisacodyl 10 mg QD PRN   dextrose 25 mL PRN   fentaNYL 25 mcg Q1H PRN   glucagon (rDNA) 1 mg PRN   hydrALAZINE 10 mg Q30 Min PRN   HYDROcodone-acetaminophen 1 tablet Q4H PRN   HYDROcodone-acetaminophen 2 tablet Q6H PRN   HYDROmorphone 0.25 mg Q2H PRN   insulin aspart 1-3 Units QHS PRN   insulin aspart 1-5 Units TID AC PRN   ondansetron 4 mg QD PRN           Review of Systems:   General: Fever/chills: Absent  HEENT: Negative for Headache,blurred vision,sore throat  Respiratory: Cough/ SOB- Absent  Cardiac: Chest wall pain +ve  Gastrointestinal: Nausea/vomiting/diarrhea/constipation-Absent, Abdominal pain- absent  Genitourinary: No burning micturition.   Musculoskeletal:Negative for - joint pain, muscular weakness or swelling   CNS/Neurological:No headache,weakness,blurred vision  Physical Exam:   BP 108/72 mmHg  Pulse 75  Temp(Src) 98.6 F (37 C) (Oral)  Resp 18  Ht 1.651 m (5\' 5" )  Wt 80.3 kg (177 lb 0.5 oz)  BMI 29.46 kg/m2  SpO2 99%  LMP  (LMP Unknown)    General appearance - awake and NAD  HEENT: NC/AT, PERL b/l, ET tube in place  Neck - supple  Chest - dressing in place, CTA b/l  Heart - normal S1 and S2 and regular rhythm  Abdomen - bowel sounds +ve, soft, non distended  Extremities - no pedal edema  Neurological - Alert    Labs:       Recent Labs  Lab 10/08/13  0324 10/07/13  0228 10/06/13  0313  10/01/13  1551   WBC 6.25 7.81 8.17  < > 4.38   HEMOGLOBIN 9.9* 10.3* 10.5*  < > 12.8   HEMATOCRIT 30.6* 32.3* 33.5*  < > 38.4   PLATELETS 164 153 156  < > 238   MCV 96.8 98.2 98.5  < > 93.4   NEUTROPHILS  --   --   --   --  41   < > = values in this interval not displayed.    Recent Labs  Lab 10/08/13  0324  10/07/13  0228 10/06/13  1957 10/06/13  0313   SODIUM 139 136  --  139   POTASSIUM 3.9 4.3 4.3 4.9   CHLORIDE 103 105  --  111   CO2 29 28  --  24   BUN 7.0 9.0  --  11.0   CREATININE 0.8 0.9  --  0.9   GLUCOSE 100 112*  --  156*   CALCIUM 8.3* 8.4*  --  8.1*   MAGNESIUM 2.3 2.4 2.1 2.7*   PHOSPHORUS 2.3 2.4  --   --    PROTEIN, TOTAL 5.1* 5.3*  --  5.7*   ALBUMIN 3.1* 3.3*  --  3.7   AST (SGOT) 24 35*  --  30   ALT 22 19  --  10   ALKALINE PHOSPHATASE 56 66  --  47   BILIRUBIN, TOTAL 0.5 0.4  --  0.3     Glucose:      Recent Labs  Lab 10/08/13  0324 10/07/13  0228 10/06/13  0313 10/05/13  1417 10/05/13  0551 10/01/13  1551   GLUCOSE 100 112* 156* 137* 100 86       Rads:   Xr Chest Ap Portable    10/08/2013    Mild bibasilar atelectasis  Laurena Slimmer, MD  10/08/2013 8:33 AM       Christophe Louis, DO  Internal Medicine  10/08/2013

## 2013-10-08 NOTE — PT Eval Note (Signed)
[facility]  Physical Therapy Evaluation and Treatment    Patient: [name]     MRN#: [id number]   Unit:  [facility]  Bed: G9562/Z3086-57      Time of Treatment:  Time Calculation  PT Received On: 10/08/2013  Start Time: 1330  Stop Time: 1356  Time Calculation (min): 26    Consult received for [name] for PT Evaluation and Treatment.  Patient's medical condition is appropriate for Physical therapy intervention at this time.    Activity Orders: None specified    Precautions and Contraindications:   Precautions  Sternal Precautions: no raising elbows higher than shoulders, no reaching behind back, no lifting, no pushing, no pulling    Medical Diagnosis: Chest pain of uncertain etiology [786.59]    History of Present Illness: [name] is a 53 y.o. female admitted on 10/01/2013 with facial, R arm numbness.  Chest pain. Admitted for cardiac cath 9/14.  S/p CABG x2  09/15.    Patient Active Problem List   Diagnosis   . Chest pain of uncertain etiology   . Elevated BP   . Anxiety   . Elevated coronary artery calcium score   . Right arm pain   . S/P CABG x 2        Past Medical/Surgical History:  Past Medical History   Diagnosis Date   . 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 Fibroid    . 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 Toe surgery Right    . Appendectomy     . Uterine fibroid surgery           X-Rays/Tests/Labs:  Lab Results   Component Value Date/Time    HGB 9.9* 10/08/2013 03:24 AM    HCT 30.6* 10/08/2013 03:24 AM    K 3.9 10/08/2013 03:24 AM    NA 139 10/08/2013 03:24 AM    INR 1.3* 10/05/2013 02:17 PM    TROPI <0.01 10/02/2013 10:46 AM    TROPI <0.01 10/02/2013 04:04 AM    TROPI <0.01 10/01/2013 11:41 PM    TROPI <0.01 10/01/2013 03:51 PM     XR Chest AP Portable: Central venous catheter is in good position. Bilateral chest tubes are  in situ    Social History:  Prior Level of Function  Prior level of function:  Independent with ADLs, Ambulates independently  Baseline Activity Level: Community ambulation  DME Currently at Home:  (none)    Home Living Arrangements  Living Arrangements: Spouse/significant other  Type of Home: House  Home Layout: Stairs to enter with rails (add number in comment), Multi-level  DME Currently at Home:  (none)    Subjective: Patient is agreeable to participation in the therapy session. Family and/or guardian are agreeable to patient's participation in the therapy session. Nursing clears patient for therapy.   Patient Goal: Home  Pain Assessment  Pain Assessment: No/denies pain    Objective:  Observation of Patient/Vital Signs:vss    Patient received out of bed, ambulating with Telemetry in place.           Cognitive Status and Neuro Exam:  Cognition  Arousal/Alertness: Appropriate responses to stimuli  Attention Span: Appears intact  Orientation Level: Oriented X4  Memory: Appears intact  Following Commands: Follows all commands and directions without difficulty  Safety Awareness: independent  Insights: Fully aware of deficits  Problem Solving: Able to problem solve independently  Neuro Status  Behavior: calm;cooperative  Motor Planning: intact  Coordination: intact    Musculoskeletal Examination  Gross ROM  Right Lower Extremity ROM: within functional limits  Left Lower Extremity ROM: within functional limits  Gross Strength  Right Lower Extremity Strength: within functional limits  Left Lower Extremity Strength: within functional limits       Functional Mobility:  Functional Mobility  Stand to Sit: Supervision     Locomotion  Ambulation: Supervision (No AD)  Ambulation Distance (Feet): 500 Feet  Pattern: decreased cadence  Stair Management: Modified Independent;alternating pattern (finger touch down for balance)  Number of Stairs: 8     Balance  Balance  Balance: within functional limits    Participation and Activity Tolerance  Participation and Endurance  Participation Effort: good  Endurance:  Tolerates 10 - 20 min exercise with multiple rests    Educated the patient to role of physical therapy, plan of care, goals of therapy and safety with mobility and ADLs.   Patient left in bedside chair, call bell within reach. RN notified of session outcome.     Assessment: [name] is a 53 y.o. female admitted 10/01/2013 .    Pt presents with the following impairments: Assessment: Decreased endurance/activity tolerance.    Pt presents near baseline functional mobility.  Pt would benefit from one additional treatment to ensure safety with bed mobility, if not assessed and educated by occupational therapist.  Pt states RNs have educated and pt has not required assistance w/ this task.      Treatment: Pt ambulated >500' w/ distant supervision, requires increased time to complete.  Decreased cadence and increased lateral sway 2/2 decreased arm movement (holding heart pillow)  No safety concerns.  Pt able to negotiate stairs w/ touch down for balance, maintaining sternal precautions (no pushing or pulling on rail).  Family educated in how to assist up/down stairs safely.       Plan: Treatment/Interventions: Bed mobility   PT Frequency: follow-up visit only (to assess bed mobility if needed)   Risks/Benefits/POC Discussed with Pt/Family: With patient     G codes: not indicated             Goals:   Goals  Goal Formulation: With patient  Time for Goal Acheivement: By time of discharge  Goals: Select goal  Pt Will Go Supine To Sit: independent  Pt Will Perform Sit to Stand: independent  Pt Will Demo Standing Balance: 5/5 indep, moves/returns center of grav in all planes > 2"           Discharge Recommendation: Home with no needs (Cardiac Rehab when recommended by MD)    Jilda Panda PT DPT  [phone]  10/08/2013 2:13 PM

## 2013-10-08 NOTE — Progress Notes (Signed)
Pacer wires cut, CTs pulled out, showered, walked on hallway many times, following

## 2013-10-08 NOTE — Treatment Plan (Signed)
Severe Sepsis Screen  Date: 10/08/2013 Time: 6:43 PM  Nurse Signature: Charliene Inoue    A. Infection:    Does your patient have ONE or more of the following infection criteria?     []  Documented Infection - Does the patient have positive culture results (from blood,        sputum, urine, etc)?   []  Anti-Infective Therapy - Is the patient receiving antibiotic, antifungal, or other                anti-infective therapy?   []  Pneumonia - Is there documentation of pneumonia (X Ray, etc)?   []  WBC's - Have WBC's been found in normally sterile fluid (urine, CSF, etc.)?   []  Perforated Viscus -Does the patient have a perforated hollow organ (bowel)?    A.  Did you check any of the boxes above?    [x]  No  If No, Stop Here and Sepsis Screen Negative.               []  Yes, continue to section B      B. SIRS:     Does your patient have TWO or more of the following SIRS criteria (ensure vital signs & temperature are within 1 hour of this screening)?    []  Temperature - Is the patient's temperature: Temp: 97.7 F (36.5 C) (10/08/13 1600)   - Greater than or equal to 38.3 degrees C (greater than 100.9 degrees F)?   - Less than or equal to 36 degrees C (less than or equal to 96.8 degrees F)?    []  Heart Rate: Heart Rate: 74 (10/08/13 1715)   - Is the patient's heart rate greater than or equal to 90 bpm?    []  Respiratory: Resp Rate: 18 (10/08/13 0615)   - Is the patient's respiratory rate greater than or equal to 20?    []  WBC Count - Is the patient's WBC count:   Recent Labs  Lab 10/08/13  0324   WBC 6.25      - Greater than or equal to 12,000/mm3 OR   - Less than or equal to 4,000/mm3 OR    - Are there greater than 10% immature neutrophils (bands)?    []  Glucose >140 without diabetes?   Recent Labs  Lab 10/08/13  0324   GLUCOSE 100       []  Significant edema?    B.  Did you check two or more of the boxes above?     []  No, Stop Here and Sepsis Screen Negative   []  Yes, continue to section C    C.  Acute Organ Dysfunction     Does  your patient have ONE or more of the following organ dysfunction? (May need to wait for lab results for assessment - see below) Organ dysfunction must be a result of the sepsis not chronic conditions.    []  Cardiovascular - Does the patient have a: BP: 111/68 mmHg (10/08/13 1630)   -systolic Blood pressure less than or equal to 90 mmHg OR   -systolic blood pressure has dropped 40 mmHg or more from baseline OR   -mean arterial pressure less than or equal to 70 mmHg (for at least one hour   despite fluid resuscitation OR require vasopressor support?  []  Respiratory - Does the patient have new hypoxia defined by any of the following"   -A sustained increase in oxygen requirements by at least 2L/min on NC or 28%    FiO2 within the  last 24 hrs OR   -A persistent decrease in oxygen saturation of greater than or equal to 5% lasting   at least four or more hours and occurring within the last 24 hours  []  Renal - Does the patient have:   -low urine output (e.g. Less than 0.43mL/kg/HR for one hour despite adequate fluid    resuscitation, OR   -Increased creatinine (greater than 50% increase from baseline) OR   -require acute dialysis?  []  Hematologic - Does the patient have a:   -Low platelet count (less than 100,000 mm3)   Recent Labs  Lab 10/08/13  0324   PLATELETS 164   OR   -INR/aPTT greater than upper limit of normal?  Recent Labs  Lab 10/05/13  1417   PT INR 1.3*    or                No results for input(s): APTT in the last 168 hours.  []  Metabolic - Does the patient have a high lactate (plasma lactate greater than or equal to 2.4 mMol/L)?   Recent Labs  Lab 10/05/13  1426 10/05/13  1254 10/05/13  1217 10/05/13  1213 10/05/13  1144   LACTIC ACID 1.2 1.3 1.1 1.0 0.7       []  Hepatic - Are the patient's liver enzymes elevated (ALT greater than 72 IU/L or Total      Bilirubin greater than 2 MG/dL)?    Recent Labs  Lab 10/08/13  0324   BILIRUBIN, TOTAL 0.5   ALT 22     []  CNS - Does the patient have altered consciousness or  reduced Glasgow Coma      Scale?    C.  Did you check any of the boxes above?     []  No, Sepsis Screen Negative   []  YES:  A) Infection + B) SIRS + C) Organ Dysfunction = Positive Screen for Severe Sepsis     Notify MD and document in Complex Assessment under provider notification   - Name of physician notified:                                         - Date/Time Notifiied:                                       Document actions: Following must be completed within 1 hour of positive sepsis screen   []  Lactate drawn (if initial lactate > , repeat lactate in 2 hours for goal decrease 10-20%)   []  Blood Cultures obtained (prior to antibiotic administration; if not done within the last 48 hours)   []  Antibiotics initiated or modified    []   IV Fluid administered 0.9% NS ___1000_______ mLs given (Initial Bolus of 30 ml/kg if SBP < 90 or MAP < 65 or lactate greater than 4 mmol/dl)  Nursing Comments?:     _________________________________________________________________    Patients meeting the following criteria are excluded from screening (check if applicable):   []  [place] hypothermia protocol   []  Comfort Care orders   []  Surgery- No screening for 24 hours after surgery (48 hours after CV surgery)       -If Yes. Date of surgery:______________________   []  Admitted with sepsis and until 72 hours after admission with documented sepsis (  RESUME SEPSIS SCREEN AFTER 72 hour window!!!)      -If Yes, Date of Documented Sepsis:______________________   _0  Positive screen AND Completed sepsis bundle during previous 24 hours       -If Yes, Date/Time of positive severe sepsis screen:_______________________

## 2013-10-09 ENCOUNTER — Inpatient Hospital Stay: Payer: BLUE CROSS/BLUE SHIELD

## 2013-10-09 LAB — GFR: EGFR: >60

## 2013-10-09 LAB — BASIC METABOLIC PANEL
Anion Gap: 9 (ref 5.0–15.0)
BUN: 6 mg/dL — ABNORMAL LOW (ref 7.0–19.0)
CO2: 28 mEq/L (ref 22–29)
Calcium: 8.4 mg/dL — ABNORMAL LOW (ref 8.5–10.5)
Chloride: 104 mEq/L (ref 100–111)
Creatinine: 0.8 mg/dL (ref 0.6–1.0)
Glucose: 109 mg/dL — ABNORMAL HIGH (ref 70–100)
Potassium: 3.6 mEq/L (ref 3.5–5.1)
Sodium: 141 mEq/L (ref 136–145)

## 2013-10-09 LAB — CBC
Hematocrit: 30.2 % — ABNORMAL LOW (ref 37.0–47.0)
Hgb: 10 g/dL — ABNORMAL LOW (ref 12.0–16.0)
MCH: 31.4 pg (ref 28.0–32.0)
MCHC: 33.1 g/dL (ref 32.0–36.0)
MCV: 95 fL (ref 80.0–100.0)
MPV: 11.3 fL (ref 9.4–12.3)
Nucleated RBC: 0 /100 WBC (ref 0–1)
Platelets: 190 10*3/uL (ref 140–400)
RBC: 3.18 10*6/uL — ABNORMAL LOW (ref 4.20–5.40)
RDW: 13 % (ref 12–15)
WBC: 5.94 10*3/uL (ref 3.50–10.80)

## 2013-10-09 LAB — MAGNESIUM: Magnesium: 2.1 mg/dL (ref 1.6–2.6)

## 2013-10-09 LAB — HEMOLYSIS INDEX: Hemolysis Index: 17 (ref 0–18)

## 2013-10-09 MED ORDER — HYDROCODONE-ACETAMINOPHEN 5-325 MG PO TABS
1.0000 | ORAL_TABLET | ORAL | Status: DC | PRN
Start: 2013-10-09 — End: 2013-10-09

## 2013-10-09 MED ORDER — BENZONATATE 100 MG PO CAPS
100.0000 mg | ORAL_CAPSULE | Freq: Three times a day (TID) | ORAL | Status: DC | PRN
Start: 2013-10-09 — End: 2013-10-09
  Filled 2013-10-09: qty 1

## 2013-10-09 MED ORDER — AMIODARONE HCL 200 MG PO TABS
200.0000 mg | ORAL_TABLET | Freq: Every day | ORAL | Status: DC
Start: 2013-10-09 — End: 2013-12-06

## 2013-10-09 MED ORDER — HYDROCODONE-ACETAMINOPHEN 5-325 MG PO TABS
1.0000 | ORAL_TABLET | ORAL | Status: DC | PRN
Start: 2013-10-09 — End: 2013-12-06

## 2013-10-09 MED ORDER — ACETAMINOPHEN-CODEINE #3 300-30 MG PO TABS
1.0000 | ORAL_TABLET | ORAL | Status: DC | PRN
Start: 2013-10-09 — End: 2013-10-09

## 2013-10-09 MED ORDER — ATORVASTATIN CALCIUM 40 MG PO TABS
40.0000 mg | ORAL_TABLET | Freq: Every evening | ORAL | Status: DC
Start: 2013-10-09 — End: 2013-12-02

## 2013-10-09 MED ORDER — ATORVASTATIN CALCIUM 40 MG PO TABS
40.0000 mg | ORAL_TABLET | Freq: Every evening | ORAL | Status: DC
Start: 2013-10-09 — End: 2013-10-09

## 2013-10-09 MED ORDER — METOPROLOL TARTRATE 25 MG PO TABS
12.5000 mg | ORAL_TABLET | Freq: Two times a day (BID) | ORAL | Status: DC
Start: 2013-10-09 — End: 2013-10-09

## 2013-10-09 MED ORDER — BENZONATATE 100 MG PO CAPS
100.0000 mg | ORAL_CAPSULE | Freq: Three times a day (TID) | ORAL | Status: DC | PRN
Start: 2013-10-09 — End: 2013-10-09

## 2013-10-09 MED ORDER — ASPIRIN 325 MG PO TABS
325.0000 mg | ORAL_TABLET | Freq: Every day | ORAL | Status: DC
Start: 2013-10-09 — End: 2014-04-30

## 2013-10-09 MED ORDER — METOPROLOL TARTRATE 25 MG PO TABS
12.5000 mg | ORAL_TABLET | Freq: Two times a day (BID) | ORAL | Status: DC
Start: 2013-10-09 — End: 2014-04-27

## 2013-10-09 MED ORDER — ACETAMINOPHEN 325 MG PO TABS
650.0000 mg | ORAL_TABLET | ORAL | Status: DC | PRN
Start: 2013-10-09 — End: 2013-12-06

## 2013-10-09 MED ORDER — SENNOSIDES-DOCUSATE SODIUM 8.6-50 MG PO TABS
1.0000 | ORAL_TABLET | Freq: Every evening | ORAL | Status: DC
Start: 2013-10-09 — End: 2013-12-06

## 2013-10-09 MED ORDER — HYDROCODONE-ACETAMINOPHEN 5-325 MG PO TABS
2.0000 | ORAL_TABLET | ORAL | Status: DC | PRN
Start: 2013-10-09 — End: 2013-10-09

## 2013-10-09 MED ORDER — BENZONATATE 100 MG PO CAPS
100.0000 mg | ORAL_CAPSULE | Freq: Three times a day (TID) | ORAL | Status: DC | PRN
Start: 2013-10-09 — End: 2013-12-06

## 2013-10-09 MED ORDER — AMIODARONE HCL 200 MG PO TABS
200.0000 mg | ORAL_TABLET | Freq: Every day | ORAL | Status: AC
Start: 2013-10-09 — End: 2013-10-20

## 2013-10-09 NOTE — Discharge Summary (Signed)
DISCHARGE SUMMARY    Date Time: 10/09/2013 9:22 AM  Patient Name: [name]  Attending Physician: Annie Main, MD    Date of Admission:   10/01/2013    Date of Discharge:   10/09/2013    Reason for Admission:   Chest pain of uncertain etiology [786.59]    Patient  Was referred for coronary revascularization  Discharge Dx:     Stable condition s/p Principal Problem:    Chest pain of uncertain etiology  Active Problems:    Elevated BP    Anxiety    Elevated coronary artery calcium score    Right arm pain    S/P CABG x 2      Problem Lists:  Patient Active Problem List   Diagnosis   . Chest pain of uncertain etiology   . Elevated BP   . Anxiety   . Elevated coronary artery calcium score   . Right arm pain   . S/P CABG x 2       Operative Procedure:   Procedure(s):  CORONARY ARTERY BYPASS  ENDOSCOPIC,VEIN HARVEST  TEE      Hospital Course:    Patient presented to  [facility] on 10/01/2013  With c/c right arm numbness  and right facial numbness and sharp chest pain; centralized, radiating to her back. Pt had negative MRI brain for any acute lesions. MRA head/neck also wnl. Troponin < 0.01. Cardiac cath done today by Dr. Glean Hess and showed L+ main disease 60% stenosis. This lesion involves the ostium of the circumflex, which is a very large, dominant vessel, probably 90% and the proximal LAD of 70% to 80%. Cardiac surgeon consulted for re vascularization with CABG. Patient is a non-smoker, +family history for premature heart disease, EF 60%  On 10/05/2013 Beneke,Shaili J underwent CABGx2- [place]-LAD, SVG- OM2, Left Patient did not require intra-operative blood or blood product transfusions.Pt was directly admitted to ICU. Surgical protocols followed. Pt was extubated without difficulty and weaned to room air with acceptable O2 saturation. Post-operative atelectasis was treated with incentive spirometer and early ambulation was encouraged. Multifactorial anemia - did not require blood or blood products post-operative,  H/H monitored, no signs of bleeding appreciated, and blood levels acceptable per surgical protocols. Volume overload was treated with furosemide intravenously, weight continued to be decreased and on discharge slightly above admission weight. Hyperglycemia management was done per [place] Protocol., and weaned off without difficulty. HgA1C 5.2  Pt is to continue  heart healthy diet. Voids without difficulty, BM yesterday.  Epicardial wires and chest drains discontinued without difficulty.  Post-operative course was unremarkable; tolerated surgical procedure well. Surgical incisions well approximated . Pt instructed to wash incisions daily with hibiclens solution and pat dry. No oils, powders, or lotions are to be applied.  No gauze dressing need to be applied. Daily weights encouraged. Pt is a nonsmoker. Any questions, or concerns, pt instructed to call surgical office [phone]. Patient will be discharge with beta blocker, statin, aspirin therapy. EF 60%, ace inhibitor not indicated.  Patient will continue on amiodarone per protocol and may resume full dose statin after the completion of amiodarone. Discharge instructions were both written and verbally explained to patient. Stated understanding noted.   On 10/09/2013 Patient was deemed in stable condition to be discharged Home with visiting nurse services POC discussed with Dr. Koleen Nimrod.    Progress Note   Post-Op Day#: 4 Days Post-Op    Bedside rounds with DR. [facility]    Patient seen and examined at  bedside. Radiographic studies, labs, and medications reviewed.     Overall Patient looks well, NAD.    Physical Exam:   Vital Signs:  Blood pressure 114/72, pulse 74, temperature 98.8 F (37.1 C), temperature source Oral, resp. rate 20, height 1.651 m (5\' 5" ), weight 80.3 kg (177 lb 0.5 oz), SpO2 98 %.  General appearance - alert, well appearing, and in no distress,Appears as stated age, well hydrated. Calm, pleasant, appropriate thought pattern. Reliable  historian. AT/NC, normal hair pattern.   Neurological - alert, oriented, normal speech, no focal findings or movement disorder noted, cranial nerves II through XII intact Mood appropriate.Eyes - pupils equal, extraocular eye movements intact, sclera anicteric  Chest - clear to auscultation, no wheezes, rales or rhonchi, symmetric air entry, no tachypnea, retractions or cyanosis Bilateral chest expansion. Sternotomy incision and endovien harvest sites well approximated, healing  Heart - normal rate, regular rhythm, normal S1, S2, no murmurs, rubs, clicks or gallops. No JVD appreciated. Remains afebrile and VSS   Abdomen - soft, nontender, nondistended, no masses or organomegaly  bowel sounds normal Tolerates diet, no c/o nausea or vomiting BM reported   Musculoskeletal - no muscular tenderness noted, full range of motion without pain  Extremities - peripheral pulses normal, no pedal edema, no clubbing or cyanosis. Gait steady,.  Skin - normal coloration and turgor, no rashes, surgical incisions well approximated, healing.  Labs:       Recent Labs  Lab 10/09/13  0400   WBC 5.94   HEMOGLOBIN 10.0*   HEMATOCRIT 30.2*   PLATELETS 190       Recent Labs  Lab 10/09/13  0404   SODIUM 141   POTASSIUM 3.6   CHLORIDE 104   CO2 28   BUN 6.0*   CREATININE 0.8   EGFR >60.0   GLUCOSE 109*   CALCIUM 8.4*         Rads:     Radiology Results (24 Hour)     Procedure Component Value Units Date/Time    XR Chest 2 Views [[id number]]     Order Status:  Sent Updated:  10/09/13 0910              Current Facility-Administered Medications   Medication Dose Route Frequency   . amiodarone  200 mg Oral Q12H   . aspirin  325 mg Oral Daily   . atorvastatin  40 mg Oral QHS   . bisacodyl  10 mg Rectal Daily   . fluticasone  2 spray Each Nare Daily   . metoprolol  12.5 mg Oral Q12H   . mupirocin   Nasal Q12H SCH   . polyethylene glycol  17 g Oral Daily   . senna-docusate  1 tablet Oral QHS   . sodium chloride (PF)  3 mL Intravenous Q8H   . Vitamin D  (Ergocalciferol)  50,000 Units Oral Weekly       Aliyana, Dlugosz   Home Medication Instructions IRJ:[id number]    Printed on:10/09/13 1016   Medication Information                      acetaminophen (TYLENOL) 325 MG tablet  Take 2 tablets (650 mg total) by mouth every 4 (four) hours as needed.             acetaminophen-codeine (TYLENOL #3) 300-30 MG per tablet  Take 1 tablet by mouth every 4 (four) hours as needed.  amiodarone (PACERONE) 200 MG tablet  Take 1 tablet (200 mg total) by mouth daily.             aspirin 325 MG tablet  Take 1 tablet (325 mg total) by mouth daily.             atorvastatin (LIPITOR) 40 MG tablet  Take 1 tablet (40 mg total) by mouth nightly.             Azelastine-Fluticasone 137-50 MCG/ACT Suspension  by Nasal route daily as needed.                benzonatate (TESSALON) 100 MG capsule  Take 1 capsule (100 mg total) by mouth 3 (three) times daily as needed for Cough.             budesonide-formoterol (SYMBICORT) 80-4.5 MCG/ACT inhaler  Inhale 2 puffs into the lungs 2 (two) times daily as needed.                cabergoline (DOSTINEX) 0.5 MG tablet  Take 0.25 mg by mouth twice a week.             fluticasone (VERAMYST) 27.5 MCG/SPRAY nasal spray  2 sprays by Nasal route daily.             fluticasone-salmeterol (ADVAIR DISKUS) 250-50 MCG/DOSE Aerosol Powder, Breath Activtivatede  Inhale 1 puff into the lungs 2 (two) times daily as needed.             metoprolol (LOPRESSOR) 25 MG tablet  Take 0.5 tablets (12.5 mg total) by mouth every 12 (twelve) hours.             orlistat (XENICAL) 120 MG capsule  Take 120 mg by mouth 3 (three) times daily with meals.             PARoxetine (PAXIL) 30 MG tablet  Take 30 mg by mouth every morning.             senna-docusate (PERICOLACE) 8.6-50 MG per tablet  Take 1 tablet by mouth nightly.             Vitamin D, Ergocalciferol, (DRISDOL) 50000 UNIT Cap  Take 50,000 Units by mouth once a week.               Assessment/Plan:      Assessment/Plan:   Recovering well from surgery     Neurological/?:   Awake, comfortable : Intact: No focal deficits   Pain Management: prn analgesia   Cardiovascular:   Remains VSS, afebrile, acceptable O2 sats on room air, continues in Sinus Rhythm  CAD s/p CABG: continue post-op surgical protocols, sternal precautions discussed  HYPERTENSION: Controlled, continued on beta blocker with acceptable BP parameters   Core Measures: Aspirin - YES; Beta blocker -Yes; ACE -Not indicated; Statin - YES, EF  60%  Pacing wires: NO  Anti-plt therapy: ASA Plt count acceptable -  No thrombocytopenia noted  Antiarrythmic therapy: continue amiodarone hold HR < 60, continue per protocol   Pulmonary :   Stable chest xray   Atelectasis/pleural effusion: pulmonary hygiene, IS encouraged, early ambulation   Chest X-ray reviewed   Gastrointestinal:   Nutrition: Tolerating Cardiac Diet, appetite fair  GI prophylaxis: Not indicated  Renal/GU:   Volume overload: -700    /24 hours, weight slightly decreased, resolving volume overload  Crea/ BUN relatively unchanged, I/O past 24 hours:   Voiding without difficulty  Hematological:   Anemia related to  surgical blood loss  and dilutional- improving no indication for transfusion   Hematocrit:  , Platelets:  Antiplatelet therapy with aspirin,         Infectious Disease:    No leukocytosis; WBC: ,afebrile            Lines: RIJ to be [place]'d prior to discharge,   Endocrinological:   Hyperglycemia: BS controlled doing well, HgbA1c: 5.2  Disposition:               Patient updated, questions answered., Extensive discharge teaching done              Cardiac Rehab consult done, PT/OT consult done, Strict Sternal Precautions  Discussed, no driving instructions                 Follow appts listed in chart:Follow-up with DR. BOGAR in one week. Call for appointment.  Chest xray is to be    done prior to surgical appt                         Follow-up with primary care physician in 2 weeks after discharge.  Follow up with               Cardiologist in 2 weeks after discharge             Case manager involved with discharge planning- discharge home with visiting RN.        Selina Cooley, NP  10/09/2013  9:22 AM

## 2013-10-09 NOTE — Plan of Care (Signed)
Problem: Pain  Goal: Patient's pain/discomfort is manageable  Outcome: Progressing  Pain controlled c vicodin/tylenol # 3

## 2013-10-09 NOTE — Progress Notes (Signed)
Home Health Referral          Referral from Ardelle Lesches (Case Manager) for home health care upon discharge.    By [employer], the patient has the right to freely choose a home care provider.  Arrangements have been made with:     A company of the patients choosing. We have supplied the patient with a listing of providers in your area who asked to be included and participate in Medicare.   [facility], a home care agency that provides both adult home care services which is a wholly owned and operated by [employer] and participates in [employer]   The preferred provider of your insurance company. Choosing a home care provider other than your insurance company's preferred provider may affect your insurance coverage.    The Home Health Care Referral Form acknowledging the voluntary selection of the home care company has been completed, signed, and is on file.      Home Health Discharge Information     Your doctor has ordered Skilled Nursing in-home service(s) for you while you recuperate at home, to assist you in the transition from hospital to home.      The agency that you or your representative chose to provide the service:  Name of Home Health Agency: [facility] [phone]      The above services were set up by:  Ardelle Lesches  [facility] Liaison)         Signed by: Ardelle Lesches  Date Time: 10/09/2013 12:03 PM

## 2013-10-09 NOTE — Progress Notes (Signed)
INTERNAL MEDICINE PROGRESS NOTE    Date: 10/09/2013  Patient Name:[name],[name] 53 y.o. female admitted with Chest pain of uncertain etiology  Patient status: Inpatient  Hospital Day: 8     Assessment:    CAD s/p cath and now s/p CABG   Chest wall pain sec to cabg - stable    Acute respiratory failure s/p CABG s/p intubation and extubation 10/05/13   Right arm pain- resolved - MRI/MRA negative   HTN stable    Anxiety.   Hyperlipidemia    Plan:    Continue current management    [place] planning in progresss   Plan of care explained to patient and her husband   Discharge plan as per cardiac surgery likely today.    Subjective:   Awake, ambulating without any problem    Procedure :  10/04/13  PRECATHETERIZATION DIAGNOSIS:  A 53 year old female who presents for cardiac evaluation with substernal  chest pain, right-sided.  POSTCATHETERIZATION DIAGNOSES:  High-grade left main stenosis of 60% to 70%, ostial large, dominant  circumflex of 90%, proximal left anterior descending of 80%, small right  coronary artery, normal left ventricular systolic function, ejection  fraction 60%.  TITLE OF PROCEDURES:  Left heart catheterization, left and right coronary angiography, left  ventricular angiography, transradial approach.    10/05/13  Operative Procedure:    Procedure(s):  CORONARY ARTERY BYPASS x 2: LIMA-LAD, SVG-OM2  ENDOSCOPIC,VEIN HARVEST  TEE    Hospital problems:  Principal Problem:    Chest pain of uncertain etiology  Active Problems:    Elevated BP    Anxiety    Elevated coronary artery calcium score    Right arm pain    S/P CABG x 2      Medications:      Scheduled Meds: PRN Meds:        amiodarone 200 mg Oral Q12H   aspirin 325 mg Oral Daily   atorvastatin 40 mg Oral QHS   bisacodyl 10 mg Rectal Daily   fluticasone 2 spray Each Nare Daily   metoprolol 12.5 mg Oral Q12H   mupirocin  Nasal Q12H SCH   polyethylene glycol 17 g Oral Daily   senna-docusate 1 tablet Oral QHS   sodium chloride (PF) 3 mL Intravenous Q8H    Vitamin D (Ergocalciferol) 50,000 Units Oral Weekly       Continuous Infusions:      acetaminophen 650 mg Q4H PRN   acetaminophen-codeine 1 tablet Q6H PRN   albuterol 1.25 mg Q6H PRN   bisacodyl 10 mg QD PRN   dextrose 25 mL PRN   fentaNYL 25 mcg Q1H PRN   glucagon (rDNA) 1 mg PRN   ondansetron 4 mg QD PRN           Review of Systems:   General: Fever/chills: Absent  HEENT: Negative for Headache,blurred vision,sore throat  Respiratory: Cough/ SOB- Absent  Cardiac: Chest wall pain +ve  Gastrointestinal: Nausea/vomiting/diarrhea/constipation-Absent, Abdominal pain- absent  Genitourinary: No burning micturition.   Musculoskeletal:Negative for - joint pain, muscular weakness or swelling   CNS/Neurological:No headache,weakness,blurred vision     Physical Exam:   BP 114/72 mmHg  Pulse 74  Temp(Src) 98.8 F (37.1 C) (Oral)  Resp 20  Ht 1.651 m (5\' 5" )  Wt 80.3 kg (177 lb 0.5 oz)  BMI 29.46 kg/m2  SpO2 98%  LMP  (LMP Unknown)    General appearance - awake and NAD  HEENT: NC/AT, PERL b/l  Neck - supple  Chest - dressing in  place, CTA b/l  Heart - normal S1 and S2 and regular rhythm  Abdomen - bowel sounds +ve, soft, non distended  Extremities - no pedal edema  Neurological - Alert    Labs:       Recent Labs  Lab 10/09/13  0400 10/08/13  0324 10/07/13  0228   WBC 5.94 6.25 7.81   HEMOGLOBIN 10.0* 9.9* 10.3*   HEMATOCRIT 30.2* 30.6* 32.3*   PLATELETS 190 164 153   MCV 95.0 96.8 98.2       Recent Labs  Lab 10/09/13  0404 10/08/13  0324 10/07/13  0228  10/06/13  0313   SODIUM 141 139 136  --  139   POTASSIUM 3.6 3.9 4.3  < > 4.9   CHLORIDE 104 103 105  --  111   CO2 28 29 28   --  24   BUN 6.0* 7.0 9.0  --  11.0   CREATININE 0.8 0.8 0.9  --  0.9   GLUCOSE 109* 100 112*  --  156*   CALCIUM 8.4* 8.3* 8.4*  --  8.1*   MAGNESIUM 2.1 2.3 2.4  < > 2.7*   PHOSPHORUS  --  2.3 2.4  --   --    PROTEIN, TOTAL  --  5.1* 5.3*  --  5.7*   ALBUMIN  --  3.1* 3.3*  --  3.7   AST (SGOT)  --  24 35*  --  30   ALT  --  22 19  --  10    ALKALINE PHOSPHATASE  --  56 66  --  47   BILIRUBIN, TOTAL  --  0.5 0.4  --  0.3   < > = values in this interval not displayed.  Glucose:      Recent Labs  Lab 10/09/13  0404 10/08/13  0324 10/07/13  0228 10/06/13  0313 10/05/13  1417 10/05/13  0551   GLUCOSE 109* 100 112* 156* 137* 100       Rads:   No results found.    Christophe Louis, DO  Internal Medicine  10/09/2013

## 2013-10-09 NOTE — Discharge Instructions (Signed)
Discharge Instructions for Coronary Artery Bypass Graft Surgery (CABG)  Your doctor performed coronary artery bypass graft surgery (also called CABG, pronounced"cabbage"). This surgery created a new pathway around blocked parts of your heart'sblood vessels, allowing blood to reach your heart muscle.Your doctor used ahealthy blood vessel from another part of your body (a graft) to restore blood flow.  Activity   Discuss with your doctor what you can and can't do as you recover. You will have good and bad days. This is normal. But tell your doctor if you feel depressed, have trouble sleeping, or have a persistent decrease in appetite. Although these problems are common after surgery, they can slow your recovery. It's important to seek help.   Let others drive you to appointments for the first3-6weeks after your surgery.   Ask someone to stand nearby while you shower or do other activities, just in case you need help.   Avoid using very hot water while showering. It can affect your circulation and make you dizzy.   Weigh yourself every day, at the same time of day, and in the same kind of clothes. Quick weight gain can be a sign of a problem that needs your doctor's attention.   Start doing light work around the house and yard after2-3weeks at home. Don'tlift anything heavier than5pounds. Until approved by your doctor, avoid mowing the lawn, vacuuming, and doing other activities that could strain your breastbone.   Ask your doctor when you can expect to return to work.  Pain Relief  You will recover faster after surgery if your pain is kept under control.   Don't be surprised if you feel sharp pains as your breastbone heals or if you have soreness in your incision during changes in weather.   Tell your doctor if you have questions about what you're feeling, if your medications don't reduce your pain, or if you suddenly feel worse.  Incision Care  Healing takes several weeks. The bandage or dressing  on your chest will likely be removed before you go home. If itis still in place, ask your nurse how you should care for it after you return home.Do the following to care for your incision:   Clean your incision every day with soap and water.   Gently pat the area of the incision to dry it.   Don't use any powders, lotions, or oils on your incision until it is well healed.  Lifestyle Changes   Ask your doctor when you can start a walking program.   If you haven't already started a walking program in the hospital, begin with short walks (about80minutes) at home. Go a little longer each day.   Choose a safe place with a level surface, such as a local park or mall.   Wear supportive shoes to prevent injury to your knees and ankles.   Walk with someone. It's more fun and helps you stay with it.   Take your medications exactly as directed. Don't skip doses.   Maintain a healthy weight. Get help to lose any extra pounds.   Cut back on salt.   Limit canned, dried, packaged, and fast foods.   Don't add salt to your food at the table.   Season foods with herbs instead of salt when you cook.   Break the smoking habit. Enroll in a stop-smoking program to improve your chances of success.  Follow-Up  Make a follow-up appointment as directed by our staff.  When to Call Your Doctor  Call your  doctor immediately if you have any of the following:   Chest pain or a return of the heart symptoms you had before your surgery   Fever above100.76F   Signs of infection (redness, swelling, drainage, or warmth) at the incision site   Shortness of breath   Fainting   Weight gain of more than3pounds in24hours or more than5pounds in1week(s)   New or increasedswelling in your hands, feet, or ankles   Unrelieved pain at the incision site(s)   Changes in the location, type, or severity of pain   Fast or irregular pulse   Any unusual bleeding    2000-2011 [employer], [address] [place], [place]  [address]. All rights reserved. This information is not intended as a substitute for professional medical care. Always follow your healthcare professional's instructions.  Date Time: 10/09/2013 10:12 AM  Attending Physician: Annie Main, MD    Date of Admission:   10/01/2013    Reason for Admission:   Chest pain of uncertain etiology [786.59]        Medications:  . Your medications have been listed for you on the Medication Reconciliation Discharge Home List. Please bring a copy of all discharge instructions, including your Medication Reconciliation Discharge Home List when you visit your physician.    . Continue taking all medications even if you feel well, unless otherwise instructed by physician.  . Do not take any over-the-counter medications or herbal supplements without checking with your pharmacist or doctor.       Activity:  . Rise slowly from a sitting or lying position. Increase activity slowly, unless otherwise instructed by physician.  Marland Kitchen Perform exercises as desginated by Therapist and Physician.  . In the event of severe shortness of breath or chest discomfort, call 911. Do NOT drive to the hospital.  . Speak with your physician regarding specific driving and/or work restrictions.    Diet:  . If you have special diet orders, you have been given printed diet instructions.   ________________________________________________________________________    Tobacco Cessation Counseling:  If you are currently a tobacco user or have used tobacco within the last 12 months, we have provided you with written Tobacco Cessation Counseling.  ________________________________________________________________________    Heart Failure Education:  If you have a diagnosis of a Heart Failure, we have provided you with written Heart Failure Education.   . Weigh yourself once a day at the same time. Record and bring the weight record to your next physician appointment.   . Call your doctor if you gain more than 3 pounds in one day  or 5 pounds in one week, or if you experience shortness of breath, leg swelling and/or chest discomfort.   . Enroll in the [employer]-Assurance Program, a heart failure patient support program. Call [phone] for additional details.   ________________________________________________________________________    Diamond Nickel Education:  . Call 911 for:  Marland Kitchen Sudden numbness or weakness of the face  . Sudden numbness of the arm or leg especially one side of the body  . Sudden confusion, trouble speaking or understanding  . Sudden trouble seeing in one or both eyes  . Sudden trouble walking or dizziness, loss of balance or coordination      . For promotion of your health, we have provided you with personalized written education on risk factors specific to your diagnosis, including but not limited to:  Marland Kitchen High Blood Pressure  . High Cholesterol  . Atrial Fibrillation  . Overweight  . Diabetes  .  Smoking         Vaccinations  Pneumonia Vaccine Received on:NO  Flu Vaccine Received on: new vaccine not available in institution             Treatments/Special Instructions:                Signed by: Jon Gills, RN    I HAVE RECEIVED AND UNDERSTAND THESE DISCHARGE INSTRUCTIONS.

## 2013-10-09 NOTE — Progress Notes (Signed)
Spoke to pt about home health services.  Agreeable to SN.  Referral sent out to find agency.  Will follow up with name /number of agency when obtained.

## 2013-10-09 NOTE — Progress Notes (Signed)
Pt discharged to home via wheelchair accompanied by transport services

## 2013-10-09 NOTE — Progress Notes (Addendum)
Discharge instruction given, pt verbalized understanding, TLC dcd, prescription given, awaiting transport services

## 2013-10-10 ENCOUNTER — Encounter: Payer: Self-pay | Admitting: Surgery

## 2013-10-11 LAB — ECG 12-LEAD
Atrial Rate: 61 {beats}/min
Atrial Rate: 84 {beats}/min
P Axis: 85 degrees
P Axis: 70 degrees
P-R Interval: 182 ms
P-R Interval: 184 ms
Q-T Interval: 460 ms
Q-T Interval: 392 ms
QRS Duration: 80 ms
QRS Duration: 72 ms
QTC Calculation (Bezet): 463 ms
QTC Calculation (Bezet): 463 ms
R Axis: -40 degrees
R Axis: 1 degrees
T Axis: 0 degrees
T Axis: 19 degrees
Ventricular Rate: 61 {beats}/min
Ventricular Rate: 84 {beats}/min

## 2013-10-11 NOTE — Progress Notes (Signed)
CM spoke w/ Shirlee Limerick from [facility] who stated they are unable to staff case for [facility] today for RN services [phone] and the needed more information on wound care needed. Ultimately, they do not want to accept pt for services. CM updated Waldo Laine [facility] on pt case - Larita Fife to f/u to reassign case.    Madelyn Brunner, MSW   Ext: [phone]

## 2013-10-12 NOTE — Progress Notes (Signed)
[facility] will staff case and start today.

## 2013-10-12 NOTE — Progress Notes (Signed)
CM spoke w/ pt who called to inquire about HH services at home - CM explained situation of other agency not being able to take case and Larita Fife set pt up w/ another agency - [facility] - CM called agency to inquire about when someone would be out and number for new agency - CM tried to assist and called agency myself to inquire about SOC - CM spoke w/ agency RN Harriett Sine will be out today after 3pm - Rep stated she would call pt now to update on time.  CM will also call to f/u to ensure everything is ok.      Madelyn Brunner, MSW   Ext: [phone]

## 2013-10-12 NOTE — Progress Notes (Signed)
CM called pt back to ensure rep called and give number [phone]- Pt pleased.     Madelyn Brunner, MSW   Ext: [phone]

## 2013-10-13 NOTE — Progress Notes (Incomplete)
CM w/ TC from Kenna Gilbert, at Dr. Vale Haven office, to request follow up on [facility] agency issues - RN did not come out on yesterday - PT came out
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
## 2013-10-19 ENCOUNTER — Inpatient Hospital Stay
Admission: RE | Admit: 2013-10-19 | Discharge: 2013-10-19 | Disposition: A | Discharge status: Home / Self Care | Payer: BLUE CROSS/BLUE SHIELD | Source: Ambulatory Visit | Attending: Cardiovascular Disease | Admitting: Cardiovascular Disease

## 2013-10-19 VITALS — Ht 65.0 in

## 2013-10-19 DIAGNOSIS — Z951 Presence of aortocoronary bypass graft: Secondary | ICD-10-CM

## 2013-10-19 HISTORY — DX: Hyperlipidemia, unspecified: E78.5
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
## 2013-10-21 ENCOUNTER — Telehealth: Payer: Self-pay

## 2013-10-25 ENCOUNTER — Ambulatory Visit: Payer: BLUE CROSS/BLUE SHIELD

## 2013-10-28 ENCOUNTER — Ambulatory Visit: Payer: BLUE CROSS/BLUE SHIELD

## 2013-10-28 NOTE — Progress Notes (Signed)
I have reviewed [facility] data and recommendations.  I agree with plan.    Teddi Badalamenti MD FACC  Medical Director, [facility] Cardiac Rehabilitation

## 2013-10-29 ENCOUNTER — Ambulatory Visit: Payer: BLUE CROSS/BLUE SHIELD

## 2013-11-01 ENCOUNTER — Ambulatory Visit: Payer: BLUE CROSS/BLUE SHIELD

## 2013-11-04 ENCOUNTER — Ambulatory Visit: Payer: BLUE CROSS/BLUE SHIELD

## 2013-11-05 ENCOUNTER — Ambulatory Visit: Payer: BLUE CROSS/BLUE SHIELD

## 2013-11-08 ENCOUNTER — Ambulatory Visit: Payer: BLUE CROSS/BLUE SHIELD

## 2013-11-11 ENCOUNTER — Ambulatory Visit (INDEPENDENT_AMBULATORY_CARE_PROVIDER_SITE_OTHER): Payer: BLUE CROSS/BLUE SHIELD | Admitting: Surgery

## 2013-11-11 ENCOUNTER — Encounter (INDEPENDENT_AMBULATORY_CARE_PROVIDER_SITE_OTHER): Payer: Self-pay | Admitting: Surgery

## 2013-11-11 ENCOUNTER — Ambulatory Visit: Payer: BLUE CROSS/BLUE SHIELD

## 2013-11-11 VITALS — BP 116/76 | HR 73 | Ht 65.0 in | Wt 170.0 lb

## 2013-11-11 DIAGNOSIS — I6523 Occlusion and stenosis of bilateral carotid arteries: Secondary | ICD-10-CM

## 2013-11-11 DIAGNOSIS — R931 Abnormal findings on diagnostic imaging of heart and coronary circulation: Secondary | ICD-10-CM

## 2013-11-11 DIAGNOSIS — I2510 Atherosclerotic heart disease of native coronary artery without angina pectoris: Secondary | ICD-10-CM

## 2013-11-11 NOTE — Progress Notes (Signed)
[facility]    Chief Complaint   Patient presents with   . Advice Only     Carotid stenosis on pre-operative carotid duplex.         History of Present Illness     [name] is a 53 y.o. who comes here today for concern of carotid artery stenosis that was found on pre-operative carotid ultrasound prior to her recent coronary artery bypass graft.  She denies any recent TIA, amaurosis fugax or stroke like symptoms.  Never used tobacco.  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  Never had a heart attack.  Denies any claudication or rest pain or swelling in her legs. She has occasional headaches and sweats.    Past Medical History     Past Medical History   Diagnosis Date   . 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 Fibroid    . 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 Hyperlipidemia    . Hypertension        Past Surgical History     Past Surgical History   Procedure Laterality Date   . Toe surgery Right    . Appendectomy     . Uterine fibroid surgery     . Coronary artery bypass N/A 10/05/2013     Procedure: CORONARY ARTERY BYPASS;  Surgeon: Annie Main, MD;  Location: [facility];  Service: Cardiothoracic;  Laterality: N/A;  LIMA TO LAD   SVG TO OM2       . Endoscopic,vein harvest N/A 10/05/2013     Procedure: ENDOSCOPIC,VEIN HARVEST;  Surgeon: Annie Main, MD;  Location: [facility];  Service: Cardiothoracic;  Laterality: N/A;   . Tee  10/05/2013     Procedure: TEE;  Surgeon: Annie Main, MD;  Location: [facility];  Service: Cardiothoracic;;  probe# [place] probe   . Cardiac catheterization     . Coronary artery bypass graft         Family History     Family History   Problem Relation Age of Onset   . Heart disease Mother    . Heart attack Mother    . Stent Mother    . Clotting disorder Neg Hx    . Diabetes Neg Hx        Social History     History     Social History   . Marital Status: Married     Spouse Name: N/A     Number of Children: N/A    . Years of Education: N/A     Occupational History   . Not on file.     Social History Main Topics   . Smoking status: Never Smoker    . Smokeless tobacco: Not on file   . Alcohol Use: Yes   . Drug Use: No   . Sexual Activity: Not on file     Other Topics Concern   . Not on file     Social History Narrative       Allergies     No Known Allergies    Medications     Current Outpatient Prescriptions on File Prior to Visit   Medication Sig Dispense Refill   . acetaminophen (TYLENOL) 325 MG tablet Take 2 tablets (650 mg total) by mouth every 4 (four) hours as needed. 30 tablet 0   . aspirin 325 MG tablet Take 1  tablet (325 mg total) by mouth daily.  0   . atorvastatin (LIPITOR) 40 MG tablet Take 1 tablet (40 mg total) by mouth nightly. 30 tablet 3   . Azelastine-Fluticasone 137-50 MCG/ACT Suspension by Nasal route daily as needed.        . benzonatate (TESSALON) 100 MG capsule Take 1 capsule (100 mg total) by mouth 3 (three) times daily as needed for Cough. 30 capsule 1   . budesonide-formoterol (SYMBICORT) 80-4.5 MCG/ACT inhaler Inhale 2 puffs into the lungs 2 (two) times daily as needed.        . cabergoline (DOSTINEX) 0.5 MG tablet Take 0.25 mg by mouth twice a week.     . fluticasone (VERAMYST) 27.5 MCG/SPRAY nasal spray 2 sprays by Nasal route daily.     . fluticasone-salmeterol (ADVAIR DISKUS) 250-50 MCG/DOSE Aerosol Powder, Breath Activtivatede Inhale 1 puff into the lungs 2 (two) times daily as needed.     Marland Kitchen HYDROcodone-acetaminophen (NORCO) 5-325 MG per tablet Take 1-2 tablets by mouth every 4 (four) hours as needed. 30 tablet 0   . metoprolol (LOPRESSOR) 25 MG tablet Take 0.5 tablets (12.5 mg total) by mouth every 12 (twelve) hours. 60 tablet 2   . orlistat (XENICAL) 120 MG capsule Take 120 mg by mouth 3 (three) times daily with meals.     Marland Kitchen PARoxetine (PAXIL) 30 MG tablet Take 30 mg by mouth every morning.     . senna-docusate (PERICOLACE) 8.6-50 MG per tablet Take 1 tablet by mouth nightly.  0   . Vitamin  D, Ergocalciferol, (DRISDOL) 50000 UNIT Cap Take 50,000 Units by mouth once a week.     Marland Kitchen amiodarone (PACERONE) 200 MG tablet Take 1 tablet (200 mg total) by mouth daily. 11 tablet 0     No current facility-administered medications on file prior to visit.       Review of Systems     All other systems were reviewed and are negative except what is stated in the HPI    Physical Exam     Filed Vitals:    11/11/13 0923   BP: 116/76   Pulse: 73       Body mass index is 28.29 kg/(m^2).    General: Patient appears their stated age, well-nourished. Alert and in no apparent distress.  HEENT: No conjunctivitis, no purulent discharge,non icteric scerlae, EOMI, Hearing grossly intact.  Lungs: Respiratory effort unlabored, chest expansion symmetric.  Cardiac: RRR, palpable pedal and radial pulses bilaterally, no swelling in extremities  Abd: Soft, nondistended, nontender. No guarding or rebound  RUE:AVWU ROM, symmetric  Skin: No gangrene, non healing ulcers, varicose veins, hyperpigmentation, or lipo-dermatosclerosis  Neuro: CN II-XII intact, gross motor and sensory intact  Psych: AAOX3, appropriate mood and affect.    Labs     I have reviewed the patient's last set of lab results in EPIC    Diagnostic Imaging     I have reviewed and interpreted the vascular diagnostic imaging with the patient.  MRA of neck showed no significant stenosis in bilateral internal carotid arteries. This was confirmed with outpatient CTA scan and carotid duplex which showed calcification at the carotid bulbs.    Assessment and Plan       1. Carotid stenosis, bilateral  US Carotid Duplex Dopp Comp Bilateral   2. Elevated coronary artery calcium score  US Carotid Duplex Dopp Comp Bilateral       Patient has mild carotid stenosis bilaterally that is asymptomatic with history significant coronary artery  disease.  The patient has no signs and symptoms consistent with clinically significant peripheral arterial disease.  At this time, she is recommended to  continue maximal medical therapy of her atherosclerotic cardiovascular disease with control of hypertension, hyperlipidemia and remaining on an aspirin daily.  I will see her back in one year at which time I will repeat her carotid duplex to reassess her stenosis as she states she will be changing her cardiologists, and will not be following up with Dr. Lovina Reach, who performed her outpatient carotid duplex.    Thank you for sending this patient to me and allowing me to participate in his care.

## 2013-11-12 ENCOUNTER — Ambulatory Visit: Payer: BLUE CROSS/BLUE SHIELD

## 2013-11-15 ENCOUNTER — Ambulatory Visit: Payer: BLUE CROSS/BLUE SHIELD

## 2013-11-18 ENCOUNTER — Ambulatory Visit: Payer: BLUE CROSS/BLUE SHIELD

## 2013-11-19 ENCOUNTER — Ambulatory Visit: Payer: BLUE CROSS/BLUE SHIELD

## 2013-11-22 ENCOUNTER — Ambulatory Visit: Payer: BLUE CROSS/BLUE SHIELD

## 2013-11-25 ENCOUNTER — Ambulatory Visit: Payer: BLUE CROSS/BLUE SHIELD

## 2013-11-26 ENCOUNTER — Ambulatory Visit: Payer: BLUE CROSS/BLUE SHIELD

## 2013-11-29 ENCOUNTER — Ambulatory Visit: Payer: BLUE CROSS/BLUE SHIELD

## 2013-12-01 ENCOUNTER — Encounter (INDEPENDENT_AMBULATORY_CARE_PROVIDER_SITE_OTHER): Payer: Self-pay | Admitting: Neurology

## 2013-12-01 ENCOUNTER — Ambulatory Visit (INDEPENDENT_AMBULATORY_CARE_PROVIDER_SITE_OTHER): Payer: BLUE CROSS/BLUE SHIELD | Admitting: Neurology

## 2013-12-01 VITALS — BP 132/92 | HR 71 | Ht 65.0 in | Wt 170.0 lb

## 2013-12-01 DIAGNOSIS — I6529 Occlusion and stenosis of unspecified carotid artery: Secondary | ICD-10-CM

## 2013-12-01 DIAGNOSIS — R200 Anesthesia of skin: Secondary | ICD-10-CM

## 2013-12-01 NOTE — Patient Instructions (Signed)
1. Please bring in your HCT and CT spine results from September for review

## 2013-12-01 NOTE — Progress Notes (Signed)
Subjective:   Chief Complaint: lip numbness  HPI:  Kirsten ELIZARRARAZ is a right handed 53 y.o. female with history of pituitary tumor, hyperlipidemia, hypertension, and coronary artery bypass graft 2, who presents with left upper lip numbness x 1 week, as well as intermittent right arm tingling/pain.    The patient states that she had a bypass on 9/15. Prior to that, she had right arm tingling and pain starting in June. She went to an emergency room in June with unremarkable work up. PCP also did a MRI brain which was negative. Started gabapentin and ibuprofen, which helped. Her right arm symptoms came back in August, then went away after her bypass. MRI brain at that time was also negative. Seen by Dr. Moise Boring in the hospital. Her right arm symptoms came back a few weeks ago, and she states it mainly comes when she is on her back or if she sleeps on her side. No symptoms when she is on her feet and active.     One week ago, the patient states that she developed some numbness in her left upper lip, which has not been away.  She states that her lip feels swollen, but does not look swollen.  No associated weakness in her face.  No associated symptoms in her left arm.  Of note, she states that she had some tenderness in her gums prior to this numbness.  She denies any associated blurry or double vision, no dysarthria, no language difficulty, no headache, no nausea, no dizziness, no chest pain, no shortness of breath.    She also describes headaches as penetrating pain in the back of the head which will sometimes radiate to the top of her head. Can feel them coming on and will take ibuprofen or vicodin which help.  No headache for the past week or 2.    She denies having any symptoms in her legs.    Sister with bell's palsy, mother with heart disease. Cardiologist is Dr. Sherrie Mustache in [place], [place]. Last saw her recently.       Review of Systems   Constitutional: Negative.    HENT: Negative.    Eyes: Negative.     Respiratory: Negative.    Cardiovascular: Negative.    Gastrointestinal: Negative.    Genitourinary: Negative.    Musculoskeletal: Negative.    Skin: Negative.    Neurological: Positive for sensory change.   Endo/Heme/Allergies: Negative.    Psychiatric/Behavioral: Negative.        The following portions of the patient's history were reviewed and updated as appropriate: allergies, current medications, past family history, past medical history, past social history, past surgical history and problem list.      No Known Allergies    Current Outpatient Prescriptions   Medication Sig Dispense Refill   . acetaminophen (TYLENOL) 325 MG tablet Take 2 tablets (650 mg total) by mouth every 4 (four) hours as needed. 30 tablet 0   . aspirin 325 MG tablet Take 1 tablet (325 mg total) by mouth daily.  0   . atorvastatin (LIPITOR) 40 MG tablet Take 1 tablet (40 mg total) by mouth nightly. 30 tablet 3   . Azelastine-Fluticasone 137-50 MCG/ACT Suspension by Nasal route daily as needed.        . benzonatate (TESSALON) 100 MG capsule Take 1 capsule (100 mg total) by mouth 3 (three) times daily as needed for Cough. 30 capsule 1   . budesonide-formoterol (SYMBICORT) 80-4.5 MCG/ACT inhaler Inhale 2 puffs into the lungs  2 (two) times daily as needed.        . cabergoline (DOSTINEX) 0.5 MG tablet Take 0.25 mg by mouth twice a week.     . fluticasone (VERAMYST) 27.5 MCG/SPRAY nasal spray 2 sprays by Nasal route daily.     . fluticasone-salmeterol (ADVAIR DISKUS) 250-50 MCG/DOSE Aerosol Powder, Breath Activtivatede Inhale 1 puff into the lungs 2 (two) times daily as needed.     Marland Kitchen HYDROcodone-acetaminophen (NORCO) 5-325 MG per tablet Take 1-2 tablets by mouth every 4 (four) hours as needed. 30 tablet 0   . metoprolol (LOPRESSOR) 25 MG tablet Take 0.5 tablets (12.5 mg total) by mouth every 12 (twelve) hours. 60 tablet 2   . orlistat (XENICAL) 120 MG capsule Take 120 mg by mouth 3 (three) times daily with meals.     Marland Kitchen PARoxetine (PAXIL) 30 MG  tablet Take 30 mg by mouth every morning.     . senna-docusate (PERICOLACE) 8.6-50 MG per tablet Take 1 tablet by mouth nightly.  0   . Vitamin D, Ergocalciferol, (DRISDOL) 50000 UNIT Cap Take 50,000 Units by mouth once a week.     Marland Kitchen amiodarone (PACERONE) 200 MG tablet Take 1 tablet (200 mg total) by mouth daily. 11 tablet 0     No current facility-administered medications for this visit.       Patient Active Problem List   Diagnosis   . Chest pain of uncertain etiology   . Elevated BP   . Anxiety   . Elevated coronary artery calcium score   . Right arm pain   . S/P CABG x 2       History   Substance Use Topics   . Smoking status: Never Smoker    . Smokeless tobacco: Not on file   . Alcohol Use: Yes       Family History   Problem Relation Age of Onset   . Heart disease Mother    . Heart attack Mother    . Stent Mother    . Clotting disorder Neg Hx    . Diabetes Neg Hx        Past Surgical History   Procedure Laterality Date   . Toe surgery Right    . Appendectomy     . Uterine fibroid surgery     . Coronary artery bypass N/A 10/05/2013     Procedure: CORONARY ARTERY BYPASS;  Surgeon: Annie Main, MD;  Location: [facility];  Service: Cardiothoracic;  Laterality: N/A;  LIMA TO LAD   SVG TO OM2       . Endoscopic,vein harvest N/A 10/05/2013     Procedure: ENDOSCOPIC,VEIN HARVEST;  Surgeon: Annie Main, MD;  Location: [facility];  Service: Cardiothoracic;  Laterality: N/A;   . Tee  10/05/2013     Procedure: TEE;  Surgeon: Annie Main, MD;  Location: [facility];  Service: Cardiothoracic;;  probe# [place] probe   . Cardiac catheterization     . Coronary artery bypass graft         Objective:   GENERAL EXAM:  Filed Vitals:    12/01/13 1530   BP: 132/92   Pulse: 71      Physical Exam   Constitutional: She is well-developed, well-nourished, and in no distress.   HENT:   Head: Normocephalic and atraumatic.   Eyes: Conjunctivae and EOM are normal. Pupils are equal, round, and reactive to light.   Neck: Normal  range of motion. Neck supple.  Cardiovascular: Normal rate, regular rhythm and normal heart sounds.    Vitals reviewed.    I examined the fundi and found no abnormality.  The optic discs were flat.  I heard no carotid bruits.     NEUROLOGIC EXAM:     Mental Status: The patient is awake, alert and oriented to person, place, and time. Affect is normal  Fund of knowledge appropriate  Recent and remote memory are intact   Attention span and concentration appear normal.  No dysarthria. Naming intact, repetition intact, fluent language.    Cranial nerves:   -CN II: Visual fields full to bedside confrontation   -CN III, IV, VI: Pupils equal, round, and reactive to light; extraocular movements intact; no ptosis   -CN V: Facial sensation intact in V1 through V3 distributions   -CN VII: Face symmetric  -CN VIII: Hearing intact to conversational speech   -CN IX, X: Palate elevates symmetrically; normal phonation   -CN XI: Symmetric full strength of sternocleidomastoid and trapezius muscles   -CN XII: Tongue protrudes midline    Motor: Muscle tone normal. No atrophy, No fasiculations.   Strength R / L      R / L  Deltoid  5 / 5   Hip Flexion        5 / 5  Triceps  5 / 5   Knee flexion      5 / 5  Biceps  5 / 5   Knee ext          5 / 5  Wrist ext  5 / 5  Dorsiflexion     5 / 5   Wrist flexion  5 / 5  Plantar flexion  5 / 5   FF   5 / 5       Sensory: subjective increased sensation in the right arm, otherwise intact to all other modalities. Subjective paresthesia in left upper lip but no sensory deficit with testing.  Light touch intact  Pinprick intact  Cold sensation intact  Vibratory sense intact  Neglect absent    Reflexes: R / L     R / L  Biceps  2 / 2   Patellar 2 / 2  Triceps  2 / 2   Ankles  2 / 2  BR                   2 / 2   Babinksi absent    Coordination: No ataxia. No tremors.     Gait and Romberg are normal. The patient is able to stand on heels and toes without difficulty. Tandem normal.      ------    LABS:  Cholesterol panel October 03, 2013:    Total cholesterol 200, triglycerides 75, HDL 44, LDL 141, VLDL 15, cholesterol/HDL ratio 4.5. Started lipitor. Started full dose aspirin.       ------    IMAGES:    MRI brain (9/14):  IMPRESSION:   Minimal nonspecific bilateral frontal white matter changes without mass  effect or edema or acute infarct. Otherwise unremarkable  Laurena Slimmer, MD   10/04/2013 9:48 AM    Carotid ultrasound on September 26, 2013 showed less than 50 percent stenosis of internal carotid artery based on velocity criteria bilaterally.  Subclavian artery, vertebral artery, ophthalmic artery and carotid siphon flow direction and velocity is normal with no hemodynamically significant stenosis bilaterally.    CTA head/neck: October 01, 2013  No significant stenosis of the right  common carotid or internal carotid artery.  There is atherosclerotic plaque at the bulb of the left carotid.  No significant stenosis of the left common carotid or internal carotid artery.  No significant stenosis of the right or left vertebral artery.  The left vertebral artery is dominant.    MRA neck:  arteries are widely patent bilaterally. The vertebral arteries are  widely patent as well. There is no stenosis.  IMPRESSION:   Normal neck MRA.  Laurena Slimmer, MD   10/04/2013 9:51 AM    Assessment:   1. Isolated left upper lip numbness x 1 week. Preceded by some gum pain. No clear sensory loss on objective testing. Due to small area of numbness in her upper lip only and no other associated symptoms to help further localize, I have a low clinical suspicion for acute stroke. She also recently had vessel imaging which showed no significant stenosis and TTE prior to her recent CABG, and so she is already on the appropriate medications for stroke prevention. Due to preceding gum pain, her lip numbness could also be from a dental etiology.     2. Intermittent right arm tingling. Present only when on her back making a  compressive cervical radiculopathy likely. Onset was this past June, but her symptoms went away for a period of time after her CABG. She states that she had a CT of her cervical spine in September when she also had similar symptoms, so I would like to review those before getting any further imaging.      Plan:     - Obtain results of her CT cervical spine from September  - I have advised the patient to seek immediate attention if she develops any different neurologic symptoms which are worrisome to her.  - Continue with current medications, including daily aspirin and statin.     Follow up: as needed.    Annamary Carolin, MD  [facility]   [facility]  T [phone] F [phone]

## 2013-12-02 ENCOUNTER — Inpatient Hospital Stay
Admission: RE | Admit: 2013-12-02 | Discharge: 2013-12-02 | Disposition: A | Discharge status: Home / Self Care | Payer: BLUE CROSS/BLUE SHIELD | Source: Ambulatory Visit | Attending: Cardiovascular Disease | Admitting: Cardiovascular Disease

## 2013-12-02 ENCOUNTER — Ambulatory Visit: Payer: BLUE CROSS/BLUE SHIELD

## 2013-12-02 VITALS — BP 96/62 | Wt 171.3 lb

## 2013-12-02 DIAGNOSIS — Z951 Presence of aortocoronary bypass graft: Secondary | ICD-10-CM

## 2013-12-02 NOTE — Progress Notes (Addendum)
Patient reports to Cardiac Rehab day 1.  Introduced to readiness for exercise checklist, pulse check, boarding pass and rate of perceived exertion scale.  Exercised without any s/s or decompensation.  Patient states she has been walking on her own at a pace of 3.0 mph for quite some time without discomfort.  Had her walk on the treadmill at a slower speed then this but assured her she would be increased to a faster pace.  Wants to resume jogging.  Had a stress test done at her cardiologist office.  Exercised for 9 minutes for max heart rate of 108 and MET level of 10.  Test was terminated due to "completion of protocol".  Informed patient we would continue to progress her as her response to exercise allowed.  Depending on how well her beta-blocker works at keeping her heart rate low, may contact physician in order to obtain clearance to exercise her at an age based level.   Exercised within 40%-80% of heart rate range based on current stress test today.

## 2013-12-03 ENCOUNTER — Inpatient Hospital Stay
Admission: RE | Admit: 2013-12-03 | Discharge: 2013-12-03 | Disposition: A | Discharge status: Home / Self Care | Payer: BLUE CROSS/BLUE SHIELD | Source: Ambulatory Visit

## 2013-12-03 ENCOUNTER — Ambulatory Visit: Payer: BLUE CROSS/BLUE SHIELD

## 2013-12-03 VITALS — BP 112/78 | Wt 174.2 lb

## 2013-12-03 DIAGNOSIS — Z951 Presence of aortocoronary bypass graft: Secondary | ICD-10-CM

## 2013-12-03 NOTE — Progress Notes (Signed)
Exercised with no signs of decompensation. Patient reported no symptoms.  Oriented to  warmups and stretches today (sternal precautions to end on 11/23).

## 2013-12-06 ENCOUNTER — Ambulatory Visit (INDEPENDENT_AMBULATORY_CARE_PROVIDER_SITE_OTHER): Payer: BLUE CROSS/BLUE SHIELD | Admitting: Neurology

## 2013-12-06 ENCOUNTER — Observation Stay: Payer: BLUE CROSS/BLUE SHIELD | Admitting: Internal Medicine

## 2013-12-06 ENCOUNTER — Inpatient Hospital Stay
Admission: RE | Admit: 2013-12-06 | Discharge: 2013-12-06 | Disposition: A | Discharge status: Home / Self Care | Payer: BLUE CROSS/BLUE SHIELD | Source: Ambulatory Visit

## 2013-12-06 ENCOUNTER — Observation Stay
Admission: EM | Admit: 2013-12-06 | Discharge: 2013-12-07 | Disposition: A | Discharge status: Home / Self Care | Payer: BLUE CROSS/BLUE SHIELD | Attending: Internal Medicine | Admitting: Internal Medicine

## 2013-12-06 ENCOUNTER — Ambulatory Visit: Payer: BLUE CROSS/BLUE SHIELD

## 2013-12-06 ENCOUNTER — Emergency Department: Payer: BLUE CROSS/BLUE SHIELD

## 2013-12-06 VITALS — BP 104/76 | Wt 172.9 lb

## 2013-12-06 DIAGNOSIS — E785 Hyperlipidemia, unspecified: Secondary | ICD-10-CM | POA: Insufficient documentation

## 2013-12-06 DIAGNOSIS — Z951 Presence of aortocoronary bypass graft: Secondary | ICD-10-CM | POA: Insufficient documentation

## 2013-12-06 DIAGNOSIS — R202 Paresthesia of skin: Secondary | ICD-10-CM | POA: Diagnosis present

## 2013-12-06 DIAGNOSIS — R079 Chest pain, unspecified: Secondary | ICD-10-CM | POA: Diagnosis present

## 2013-12-06 DIAGNOSIS — M25511 Pain in right shoulder: Secondary | ICD-10-CM | POA: Insufficient documentation

## 2013-12-06 DIAGNOSIS — R0789 Other chest pain: Principal | ICD-10-CM | POA: Insufficient documentation

## 2013-12-06 DIAGNOSIS — I2510 Atherosclerotic heart disease of native coronary artery without angina pectoris: Secondary | ICD-10-CM | POA: Insufficient documentation

## 2013-12-06 DIAGNOSIS — D352 Benign neoplasm of pituitary gland: Secondary | ICD-10-CM | POA: Insufficient documentation

## 2013-12-06 DIAGNOSIS — R072 Precordial pain: Secondary | ICD-10-CM

## 2013-12-06 DIAGNOSIS — I10 Essential (primary) hypertension: Secondary | ICD-10-CM | POA: Insufficient documentation

## 2013-12-06 DIAGNOSIS — M79621 Pain in right upper arm: Secondary | ICD-10-CM | POA: Insufficient documentation

## 2013-12-06 DIAGNOSIS — I25810 Atherosclerosis of coronary artery bypass graft(s) without angina pectoris: Secondary | ICD-10-CM

## 2013-12-06 DIAGNOSIS — Z7982 Long term (current) use of aspirin: Secondary | ICD-10-CM | POA: Insufficient documentation

## 2013-12-06 DIAGNOSIS — R200 Anesthesia of skin: Secondary | ICD-10-CM | POA: Insufficient documentation

## 2013-12-06 DIAGNOSIS — D259 Leiomyoma of uterus, unspecified: Secondary | ICD-10-CM | POA: Insufficient documentation

## 2013-12-06 LAB — CBC AND DIFFERENTIAL
Basophils Absolute Automated: 0.02 10*3/uL (ref 0.00–0.20)
Basophils Automated: 0 %
Eosinophils Absolute Automated: 0.17 10*3/uL (ref 0.00–0.70)
Eosinophils Automated: 3 %
Hematocrit: 42.1 % (ref 37.0–47.0)
Hgb: 13.8 g/dL (ref 12.0–16.0)
Immature Granulocytes Absolute: 0.01 10*3/uL
Immature Granulocytes: 0 %
Lymphocytes Absolute Automated: 2.76 10*3/uL (ref 0.50–4.40)
Lymphocytes Automated: 52 %
MCH: 31.1 pg (ref 28.0–32.0)
MCHC: 32.8 g/dL (ref 32.0–36.0)
MCV: 94.8 fL (ref 80.0–100.0)
MPV: 10.6 fL (ref 9.4–12.3)
Monocytes Absolute Automated: 0.36 10*3/uL (ref 0.00–1.20)
Monocytes: 7 %
Neutrophils Absolute: 1.98 10*3/uL (ref 1.80–8.10)
Neutrophils: 37 %
Nucleated RBC: 0 /100 WBC (ref 0–1)
Platelets: 254 10*3/uL (ref 140–400)
RBC: 4.44 10*6/uL (ref 4.20–5.40)
RDW: 12 % (ref 12–15)
WBC: 5.29 10*3/uL (ref 3.50–10.80)

## 2013-12-06 LAB — COMPREHENSIVE METABOLIC PANEL
ALT: 17 U/L (ref 0–55)
AST (SGOT): 25 U/L (ref 5–34)
Albumin/Globulin Ratio: 1.5 (ref 0.9–2.2)
Albumin: 4.4 g/dL (ref 3.5–5.0)
Alkaline Phosphatase: 92 U/L (ref 37–106)
Anion Gap: 8 (ref 5.0–15.0)
BUN: 13 mg/dL (ref 7.0–19.0)
Bilirubin, Total: 0.8 mg/dL (ref 0.2–1.2)
CO2: 24 mEq/L (ref 22–29)
Calcium: 9.7 mg/dL (ref 8.5–10.5)
Chloride: 108 mEq/L (ref 100–111)
Creatinine: 1.1 mg/dL — ABNORMAL HIGH (ref 0.6–1.0)
Globulin: 3 g/dL (ref 2.0–3.6)
Glucose: 84 mg/dL (ref 70–100)
Potassium: 4.8 mEq/L (ref 3.5–5.1)
Protein, Total: 7.4 g/dL (ref 6.0–8.3)
Sodium: 140 mEq/L (ref 136–145)

## 2013-12-06 LAB — GFR: EGFR: >60

## 2013-12-06 LAB — HEMOLYSIS INDEX: Hemolysis Index: 22 — ABNORMAL HIGH (ref 0–18)

## 2013-12-06 LAB — TROPONIN I
Troponin I: <0.01 ng/mL (ref 0.00–0.09)
Troponin I: <0.01 ng/mL (ref 0.00–0.09)

## 2013-12-06 MED ORDER — HEPARIN SODIUM (PORCINE) 5000 UNIT/ML IJ SOLN
5000.0000 [IU] | Freq: Three times a day (TID) | INTRAMUSCULAR | Status: DC
Start: 2013-12-06 — End: 2013-12-07
  Administered 2013-12-06 – 2013-12-07 (×3): 5000 [IU] via SUBCUTANEOUS
  Filled 2013-12-06 (×3): qty 1

## 2013-12-06 MED ORDER — METOPROLOL TARTRATE 25 MG PO TABS
12.5000 mg | ORAL_TABLET | Freq: Two times a day (BID) | ORAL | Status: DC
Start: 2013-12-06 — End: 2013-12-07
  Administered 2013-12-07: 12.5 mg via ORAL
  Filled 2013-12-06: qty 1

## 2013-12-06 MED ORDER — ATORVASTATIN CALCIUM 40 MG PO TABS
40.0000 mg | ORAL_TABLET | Freq: Every evening | ORAL | Status: DC
Start: 2013-12-06 — End: 2013-12-07
  Administered 2013-12-06: 40 mg via ORAL
  Filled 2013-12-06: qty 1

## 2013-12-06 MED ORDER — NITROGLYCERIN 0.4 MG SL SUBL
0.4000 mg | SUBLINGUAL_TABLET | SUBLINGUAL | Status: AC
Start: 2013-12-06 — End: 2013-12-06
  Administered 2013-12-06: 0.4 mg via SUBLINGUAL
  Filled 2013-12-06: qty 1

## 2013-12-06 MED ORDER — NITROGLYCERIN 0.4 MG SL SUBL
0.4000 mg | SUBLINGUAL_TABLET | SUBLINGUAL | Status: AC
Start: 2013-12-06 — End: 2013-12-06
  Administered 2013-12-06: 0.4 mg via SUBLINGUAL

## 2013-12-06 MED ORDER — ASPIRIN 325 MG PO TABS
325.0000 mg | ORAL_TABLET | Freq: Every day | ORAL | Status: DC
Start: 2013-12-07 — End: 2013-12-07
  Administered 2013-12-07: 325 mg via ORAL
  Filled 2013-12-06: qty 1

## 2013-12-06 MED ORDER — NALOXONE HCL 0.4 MG/ML IJ SOLN
0.2000 mg | INTRAMUSCULAR | Status: DC | PRN
Start: 2013-12-06 — End: 2013-12-07

## 2013-12-06 MED ORDER — MORPHINE SULFATE 2 MG/ML IJ/IV SOLN (WRAP)
2.0000 mg | Freq: Once | Status: DC
Start: 2013-12-06 — End: 2013-12-07

## 2013-12-06 MED ORDER — MORPHINE SULFATE 2 MG/ML IJ/IV SOLN (WRAP)
2.0000 mg | Freq: Four times a day (QID) | Status: DC | PRN
Start: 2013-12-06 — End: 2013-12-07

## 2013-12-06 MED ORDER — VITAMIN D (ERGOCALCIFEROL) 1.25 MG (50000 UT) PO CAPS
50000.0000 [IU] | ORAL_CAPSULE | ORAL | Status: DC
Start: 2013-12-06 — End: 2013-12-07
  Administered 2013-12-06: 50000 [IU] via ORAL
  Filled 2013-12-06: qty 1

## 2013-12-06 MED ORDER — PAROXETINE HCL 20 MG PO TABS
30.0000 mg | ORAL_TABLET | Freq: Every evening | ORAL | Status: DC
Start: 2013-12-06 — End: 2013-12-07
  Administered 2013-12-06: 30 mg via ORAL
  Filled 2013-12-06: qty 2

## 2013-12-06 MED ORDER — ASPIRIN 81 MG PO CHEW
81.0000 mg | CHEWABLE_TABLET | Freq: Once | ORAL | Status: DC
Start: 2013-12-06 — End: 2013-12-06

## 2013-12-06 NOTE — ED Notes (Signed)
Bed: DG64  Expected date:   Expected time:   Means of arrival:   Comments:  Cardiac Rehab

## 2013-12-06 NOTE — ED Notes (Addendum)
PT comes to ED from Cardiac Rehab after experiencing CP while exercising on bike. Pt states that she believes the pain woke her up out her sleep early this AM and it has been intermittent for several days. Pt has hx of CABGx2 vessels 09/2013. Pt rates pain as 3/10 L CW pain as well as RUE. Pt took her daily 324mg  ASA this AM. Pt appears comfortable, conversational while laying in bed.

## 2013-12-06 NOTE — Consults (Signed)
[facility] CONSULTATION REPORT    Date Time: 12/06/2013 3:17 PM  Patient Name: [name]  Requesting Physician: Christophe Louis, DO       Assessment:   S/p CABG x 2  HTN  Dyslipidemia  Perioral numbness, right arm tingling          Plan:    Will get neuro eval as outpt and probable safe for [place] later today. F/u Dr Lovina Reach as outpt.        Signed by: Fortino Sic, MD  OFFICE [phone]  MD LINE [phone]      Reason for Consultation:   Right arm tingling, oral numbness.      History:   [name] is a 53 y.o. female who presents to the hospital on 12/06/2013 with right arm pain, oral tingling , numbness  relavent cardiac history coronary artery disease. Pt had CABG 09/2013 and has been doing well since then , cardiac rehab, and had developed oral numbness recently. She then also had a mild sharp right sided CP that was non- radiating.    Past Medical History:     Past Medical History   Diagnosis Date   . 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 Fibroid    . 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 Hyperlipidemia    . Hypertension        Past Surgical History:     Past Surgical History   Procedure Laterality Date   . Toe surgery Right    . Appendectomy     . Uterine fibroid surgery     . Coronary artery bypass N/A 10/05/2013     Procedure: CORONARY ARTERY BYPASS;  Surgeon: Annie Main, MD;  Location: [facility];  Service: Cardiothoracic;  Laterality: N/A;  LIMA TO LAD   SVG TO OM2       . Endoscopic,vein harvest N/A 10/05/2013     Procedure: ENDOSCOPIC,VEIN HARVEST;  Surgeon: Annie Main, MD;  Location: [facility];  Service: Cardiothoracic;  Laterality: N/A;   . Tee  10/05/2013     Procedure: TEE;  Surgeon: Annie Main, MD;  Location: [facility];  Service: Cardiothoracic;;  probe# [place] probe   . Cardiac catheterization     . Coronary artery bypass graft         Family History:     Family History   Problem Relation Age of Onset   . Heart disease Mother    . Heart attack Mother     . Stent Mother    . Clotting disorder Neg Hx    . Diabetes Neg Hx        Social History:     History     Social History   . Marital Status: Married     Spouse Name: N/A     Number of Children: N/A   . Years of Education: N/A     Social History Main Topics   . Smoking status: Never Smoker    . Smokeless tobacco: Not on file   . Alcohol Use: No   . Drug Use: No   . Sexual Activity: Not on file     Other Topics Concern   . Not on file     Social History Narrative       Allergies:   No Known Allergies    Medications:     Prescriptions prior to admission   Medication Sig   .  aspirin 325 MG tablet Take 1 tablet (325 mg total) by mouth daily.   Marland Kitchen atorvastatin (LIPITOR) 40 MG tablet Take 40 mg by mouth daily.   . Azelastine-Fluticasone 137-50 MCG/ACT Suspension by Nasal route daily as needed.      . budesonide-formoterol (SYMBICORT) 80-4.5 MCG/ACT inhaler Inhale 2 puffs into the lungs 2 (two) times daily as needed.      . cabergoline (DOSTINEX) 0.5 MG tablet Take 0.25 mg by mouth twice a week.   . fluticasone (VERAMYST) 27.5 MCG/SPRAY nasal spray 2 sprays by Nasal route as needed.      . fluticasone-salmeterol (ADVAIR DISKUS) 250-50 MCG/DOSE Aerosol Powder, Breath Activtivatede Inhale 1 puff into the lungs 2 (two) times daily as needed.   . metoprolol (LOPRESSOR) 25 MG tablet Take 0.5 tablets (12.5 mg total) by mouth every 12 (twelve) hours.   Marland Kitchen orlistat (XENICAL) 120 MG capsule Take 120 mg by mouth 3 (three) times daily as needed.      Marland Kitchen PARoxetine (PAXIL) 30 MG tablet Take 30 mg by mouth nightly.      . Vitamin D, Ergocalciferol, (DRISDOL) 50000 UNIT Cap Take 50,000 Units by mouth every 30 (thirty) days.                     @cme @       Review of Systems:   Comprehensive review of systems including constitutional, eyes, ears, nose, mouth, throat, ==cardiovascular, GI, GU, musculoskeletal, integumentary, respiratory, ==neurologic, psychiatric, and endocrine is negative other than what is mentioned already in the history  of present illness    Physical Exam:     Filed Vitals:    12/06/13 1059   BP: 145/89   Pulse: 68   Temp: 97.9 F (36.6 C)   Resp: 18   SpO2: 99%     Temp (24hrs), Avg:98 F (36.7 C), Min:97.9 F (36.6 C), Max:98.2 F (36.8 C)      Intake and Output Summary (Last 24 hours) at Date Time  No intake or output data in the 24 hours ending 12/06/13 1517    GENERAL: Patient is in no acute distress   HEENT: No scleral icterus or conjunctival pallor, moist mucous membranes   NECK: No jugular venous distention or thyromegaly, normal carotid upstrokes without bruits   CARDIAC: Normal apical impulse, regular rate and rhythm, with normal S1 and S2, and no murmurs, rubs, or gallops   CHEST: Clear to auscultation bilaterally, normal respiratory effort, healed incision  ABDOMEN: No abdominal bruits, masses, or hepatosplenomegaly, nontender, non-distended, good bowel sounds   EXTREMITIES: No clubbing, cyanosis, or edema, 2+ DP, PT, and femoral pulses bilaterally without bruits SKIN: No rash or jaundice   NEUROLOGIC: Alert and oriented to time, place and person, normal mood and affect , perioral numbness MUSCULOSKELETAL: Normal muscle strength and tone.      Labs Reviewed:   Recent CMP   Recent Labs      12/06/13   0855   GLUCOSE  84   BUN  13.0   CREATININE  1.1*   SODIUM  140   CO2  24   CALCIUM  9.7   ALBUMIN  4.4   AST (SGOT)  25   ALT  17   GLOBULIN  3.0     Recent CARDIAC ENZYMES No results for input(s): TROPONIN, ISTATTROPONI, CK in the last 24 hours.    Invalid input(s): TROPONINT, CKMB[24  Recent TSH Invalid input(s): TSH[24  Recent CBC WITH DIFF   Recent Labs  12/06/13   0855   RBC  4.44   HEMOGLOBIN  13.8   HEMATOCRIT  42.1   MCV  94.8   MCHC  32.8   RDW  12   MPV  10.6   PLATELETS  254     Recent LFT   Recent Labs      12/06/13   0855   AST (SGOT)  25   ALT  17   ALBUMIN  4.4   GLOBULIN  3.0     Recent LIPID PANEL No results for input(s): CHOL, TRIG, HDL in the last 24 hours.    Invalid input(s): LDLC, VLDLC,  LRAT[24  Recent ABG No results for input(s): TEMP, FIO2, RATE, MODE, ETCO2, PEEP in the last 24 hours.    Invalid input(s): APH, APCO2, APO2, AHCO3, ATCO2, ABE, AOSAT, ABGS, ALLEN, STATS, O2DEL, O2FLO, PRESS, VNTMN, PRSUP, TIVOL[24        Rads:   Radiological Procedure reviewed.      chest X-ray  ECG- nsr lat bstt changes no new findings.

## 2013-12-06 NOTE — Progress Notes (Signed)
Patient completed day 3 of Cardiac Rehab.  Stated she had 6/10 chest discomfort when exercising on the stationary bike.  This resolved with exercise.  Advised patient to schedule a visit with her cardiologist and receive clearance to return to exercise given this new symptom.  Patient verbalized understanding and experienced no further discomfort during the cool-down.  Patient opted to go to the ER for an evaluation of the resolved chest pain.  Stated she would contact her cardiologist upon discharge.

## 2013-12-06 NOTE — Plan of Care (Signed)
Problem: Safety  Goal: Patient will be free from injury during hospitalization  Outcome: Progressing  Intervention: Provide alternative method of communication if needed (communication boards, writing).  Plan of care discussed with patient and  Family at the bedside,hourly rounding, safe environment and monitor neuro status are mentioned during hands off report with outgoing RN,patient and family agreed to it.will continue to monitor.

## 2013-12-06 NOTE — ED Provider Notes (Signed)
EMERGENCY DEPARTMENT HISTORY AND PHYSICAL EXAM    Date: 12/06/2013  Patient Name: [name]  Attending Physician:  Lavonda Jumbo, MD, FACEP  Diagnosis and Treatment Plan       Clinical Impression:   Final diagnoses:   Substernal chest pain       Treatment Plan:   ED Disposition     Admit Admitting Physician: Christophe Louis [[id number]]  Diagnosis: Substernal chest pain [228791]  Estimated Length of Stay: > or = to 2 midnights  Tentative Discharge Plan?: Home or Self Care [1]  Patient Class: Inpatient [101]  I certify that inpatient services are medically necessary for this patient. Please see H&P and MD progress notes for additional information about the patient's course of treatment. For Medicare patients, services provided in accordance with 412.3 and expected LOS to be greater than 2 midnights for Medicare patients.: Yes            History of Presenting Illness     Chief Complaint   Patient presents with   . Chest Pain       History Provided By: Patient  Chief Complaint: Chest pain  Onset: Last night  Timing: Intermittent  Location: Midline chest occasionally radiating to right arm  Quality: Stabbing  Severity: Moderate  Modifying Factors: None  Associated sxs: None    Additional History: [name] is a 53 y.o. female presenting to the ED with intermittent stabbing midline chest pain occasionally radiating to right arm since last night. Patient reports that she awoke last night with the arm pain, and then noticed that her chest was hurting. This morning, pt was at rehab for double bypass (10/05/13) and had pain, so she came to the ED for evaluation. Patient currently has mild chest pain, but states that it can be rated as "6-7/10." Patient denies any other Sx. Patient states that she has f/u with cardiology as directed, was seen by neurology for facial numbness (unremarkable exam), and has appointment with Dr. Richardson Chiquito this week. Patient admits to drinking alcohol occasionally, but denies smoking cigarettes or  using drugs. Pt has NKDA.    Cardiology: [facility]    Cardiothoracic surgeon: Dr. Tamsen Meek    PCP: Ardeen Garland, MD      Current facility-administered medications: aspirin chewable tablet 81 mg, 81 mg, Oral, Once, Donny Pique, MD, 81 mg at 12/06/13 0909;  morphine injection 2 mg, 2 mg, Intravenous, Once, Cyniah Gossard, Landis Gandy, MD, Stopped at 12/06/13 0915    Past Medical History     Past Medical History   Diagnosis Date   . 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 Fibroid    . 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 Hyperlipidemia    . Hypertension      Past Surgical History   Procedure Laterality Date   . Toe surgery Right    . Appendectomy     . Uterine fibroid surgery     . Coronary artery bypass N/A 10/05/2013     Procedure: CORONARY ARTERY BYPASS;  Surgeon: Annie Main, MD;  Location: [facility];  Service: Cardiothoracic;  Laterality: N/A;  LIMA TO LAD   SVG TO OM2       . Endoscopic,vein harvest N/A 10/05/2013     Procedure: ENDOSCOPIC,VEIN HARVEST;  Surgeon: Annie Main, MD;  Location: [facility];  Service: Cardiothoracic;  Laterality: N/A;   . Tee  10/05/2013     Procedure: TEE;  Surgeon: Annie Main, MD;  Location: [facility];  Service: Cardiothoracic;;  probe# [place]  probe   . Cardiac catheterization     . Coronary artery bypass graft         Family History     Family History   Problem Relation Age of Onset   . Heart disease Mother    . Heart attack Mother    . Stent Mother    . Clotting disorder Neg Hx    . Diabetes Neg Hx        Social History     History     Social History   . Marital Status: Married     Spouse Name: N/A     Number of Children: N/A   . Years of Education: N/A     Social History Main Topics   . Smoking status: Never Smoker    . Smokeless tobacco: Not on file   . Alcohol Use: No   . Drug Use: No   . Sexual Activity: Not on file     Other Topics Concern   . Not on file     Social History Narrative       Allergies     No Known Allergies    Review of Systems      Review of Systems   Respiratory: Negative for shortness of breath.    Cardiovascular: Positive for chest pain.   Musculoskeletal:        + Right arm pain radiating from chest   Endo/Heme/Allergies: Negative for environmental allergies (NKDA).   All other systems reviewed and are negative.        Physical Exam     BP 145/89 mmHg  Pulse 68  Temp(Src) 97.9 F (36.6 C) (Oral)  Resp 18  Ht 1.651 m  Wt 78.019 kg  BMI 28.62 kg/m2  SpO2 99%  Pulse Oximetry Analysis - Normal             Physical Examination: General appearance - alert, well appearing, and in no distress  Mental status - alert, oriented to person, place, and time  Eyes - pupils equal and reactive, extraocular eye movements intact  Nose - normal and patent, no discharge  Neck - grossly normal rom  Chest - clear to auscultation, no wheezes, rales or rhonchi, symmetric air entry  Heart - normal rate, regular rhythm  Abdomen - soft, nontender, nondistended  Neurological - maex4, speech clear  Musculoskeletal - no joint tenderness, deformity or swelling  Extremities - distal blood flow grossly normal, no pedal edema  Skin - normal coloration, no rashes where visualized   Psychiatric- alert, oriented, anxious        Incision cdi          EKG:   Interpreted by EDP. Sinus rhythm. Rate is normal. Nonspecific ST/Twave changes.  Monitor: Normal sinus rhythm.            Diagnostic Study Results     Labs -     Results     Procedure Component Value Units Date/Time    Troponin I [[id number]] Collected:  12/06/13 0855    Specimen Information:  Blood Updated:  12/06/13 0924     Troponin I <0.01 ng/mL     Comprehensive metabolic panel [[id number]]  (Abnormal) Collected:  12/06/13 0855    Specimen Information:  Blood Updated:  12/06/13 0919     Glucose 84 mg/dL      BUN 78.2 mg/dL  Creatinine 1.1 (H) mg/dL      Sodium 130 mEq/L      Potassium 4.8 mEq/L      Chloride 108 mEq/L      CO2 24 mEq/L      CALCIUM 9.7 mg/dL      Protein, Total 7.4 g/dL      Albumin 4.4  g/dL      AST (SGOT) 25 U/L      ALT 17 U/L      Alkaline Phosphatase 92 U/L      Bilirubin, Total 0.8 mg/dL      Globulin 3.0 g/dL      Albumin/Globulin Ratio 1.5      Anion Gap 8.0     Hemolysis index [[id number]]  (Abnormal) Collected:  12/06/13 0855     Hemolysis Index 22 (H) Updated:  12/06/13 0919    GFR [[id number]] Collected:  12/06/13 0855     EGFR >60.0 Updated:  12/06/13 0919    CBC and differential [[id number]] Collected:  12/06/13 0855    Specimen Information:  Blood / Blood Updated:  12/06/13 0904     WBC 5.29 x10 3/uL      RBC 4.44 x10 6/uL      Hgb 13.8 g/dL      Hematocrit 36.6 %      MCV 94.8 fL      MCH 31.1 pg      MCHC 32.8 g/dL      RDW 12 %      Platelets 254 x10 3/uL      MPV 10.6 fL      Neutrophils 37 %      Lymphocytes Automated 52 %      Monocytes 7 %      Eosinophils Automated 3 %      Basophils Automated 0 %      Immature Granulocyte 0 %      Nucleated RBC 0 /100 WBC      Neutrophils Absolute 1.98 x10 3/uL      Abs Lymph Automated 2.76 x10 3/uL      Abs Mono Automated 0.36 x10 3/uL      Abs Eos Automated 0.17 x10 3/uL      Absolute Baso Automated 0.02 x10 3/uL      Absolute Immature Granulocyte 0.01 x10 3/uL           Radiologic Studies -   Radiology Results (24 Hour)     Procedure Component Value Units Date/Time    XR Chest AP Portable [[id number]] Collected:  12/06/13 9563    Order Status:  Completed Updated:  12/06/13 0930    Narrative:      History: diffuse chest pain    Technique: Single Portable View    Comparison: 10/09/2013    Findings:  The lungs appear clear.  There is no pneumothorax.  The heart is top normal in size    The mediastinum is within normal  limits.    Status post poststernotomy      Impression:       No active disease is seen in the chest.    Laurena Slimmer, MD   12/06/2013 9:26 AM        .    [facility] Notes       Old Medical Records: Dr. Koleen Nimrod performed double bypass 10/05/13.    9:01 AM -  Case discussed with Porfirio Mylar, NP cardiothoracic surgery. Asks that we admit  patient to medicine and cardiology and they will follow.  9:36 AM -  Case discussed with Dr. Richardson Chiquito, internal medicine, who accepts patient for admission to Telemetry.     9:37 AM -  No answer from [place]. [employer] .    9:53 AM -  Case discussed with Dr. Glean Hess, cardiology, who will agrees with plan.    10:09 AM -  Patient reevaluated at bedside. Discussed results and plan. She understands and agrees.     Core Measures:  Adult Chest Pain:  12-lead EKG was preformed in the ED. Aspirin was not given because pt took ASA PTA.      _______________________________  Medical DeMedical Decision Makingcision Making  Attestations:     Physician/Midlevel provider first contact with patient: 12/06/13 0853         This note is prepared for Lavonda Jumbo, MD, FACEP. The scribe's documentation has been prepared under my direction and personally reviewed by me in its entirety.  I confirm that the note above accurately reflects all work, treatment, procedures, and medical decision making performed by me.     I am the first provider for this patient.      Lavonda Jumbo, MD, FACEP is the primary emergency doctor of record.      I reviewed the available vital signs, nursing notes, past medical history, past surgical history, family history and social history at the time of initial evaluation.    _______________________________              Donny Pique, MD  12/06/13 [id number]

## 2013-12-06 NOTE — Consults (Signed)
[facility] Consultation Note                                       Date Time: 12/06/2013 11:48 AM  Patient Name: [name]  Requesting Physician: Christophe Louis, DO  Date of Admission: 12/06/2013    CC: Perioral numbness, right arm and shoulder pain          Assessment:   Chronic intermittent perioral numbness and RUE pain in a 53 y.o. female with a PMH significant for pituitary tumor, hyperlipidemia, hypertension, and CABG 2 approximately 2 months ago. 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 Etiology of her perioral numbness is unclear.    Patient Active Problem List   Diagnosis   . Chest pain of uncertain etiology   . Elevated BP   . Anxiety   . Elevated coronary artery calcium score   . Right arm pain   . S/P CABG x 2   . Substernal chest pain   . HTN (hypertension)   . Hyperlipidemia   . Coronary artery disease involving autologous vein bypass graft   . Numbness and tingling of right arm   . Numbness of lip   . Chest pain        Plan:   -R/O cardiac etiology for her chest pain per primary team  -Would consider an MRI C-spine and an MRI brain with and without contrast to evaluate additional complaints however could be done as an outpatient  -Continue ASA and statin    Attending note:    The patient was seen and examined by me. History was independently reviewed and confirmed by me. All pertinent parts of the neurological exam were performed and confirmed by me, with any additional findings on exam noted in bold. I agree with the assessment and plan as outlined by the mid-level provider as above, with any additional considerations or recommendations as noted:     Exam as below; history as well.  Main focus of admission should be w/u of cardiac complaints.  If remains in house, can continue neurological workup as above.    Johnston Ebbs, MD  [facility]              HPI   Ms. [name] is a right handed 53 y.o. female with a PMH significant for  pituitary tumor, hyperlipidemia, hypertension, and CABG 2 approximately 2 months ago who presents with chest pain , acute on chronic upper lip numbness x 1 week, as well as acute on chronic intermittent right arm tingling/pain. Patient states she woke up last night with right shoulder and arm pain. Describes pain as beginning in the right scapula and radiating in her arm distally. Pain increased when lying down, improved when up. She also reported intermittent chest pain/discomfort. Describes similar pain prior to her CABG. In addition to the above, patient reports peri-oral numbness that has been present for several months primarily on the right side of her face but is now primarily left sided. Was at cardiac rehab on the treadmill this AM when she redeveloped chest pain. She denies any associated blurry or double vision, no dysarthria, no language difficulty, no headache, no nausea, no dizziness, no chest pain, no shortness of breath.    Saw Dr Cathren Laine one week ago for arm pain and perioral sx.        Past Medical  Hx     Past Medical History   Diagnosis Date   . 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 Fibroid    . 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 Hyperlipidemia    . Hypertension           Past Surgical Hx:     Past Surgical History   Procedure Laterality Date   . Toe surgery Right    . Appendectomy     . Uterine fibroid surgery     . Coronary artery bypass N/A 10/05/2013     Procedure: CORONARY ARTERY BYPASS;  Surgeon: Annie Main, MD;  Location: [facility];  Service: Cardiothoracic;  Laterality: N/A;  LIMA TO LAD   SVG TO OM2       . Endoscopic,vein harvest N/A 10/05/2013     Procedure: ENDOSCOPIC,VEIN HARVEST;  Surgeon: Annie Main, MD;  Location: [facility];  Service: Cardiothoracic;  Laterality: N/A;   . Tee  10/05/2013     Procedure: TEE;  Surgeon: Annie Main, MD;  Location: [facility];  Service: Cardiothoracic;;  probe#  probe   . Cardiac catheterization     . Coronary artery  bypass graft          Family Medical History:      Family History   Problem Relation Age of Onset   . Heart disease Mother    . Heart attack Mother    . Stent Mother    . Clotting disorder Neg Hx    . Diabetes Neg Hx        Social Hx     History     Social History   . Marital Status: Married     Spouse Name: N/A     Number of Children: N/A   . Years of Education: N/A     Occupational History   . Not on file.     Social History Main Topics   . Smoking status: Never Smoker    . Smokeless tobacco: Not on file   . Alcohol Use: No   . Drug Use: No   . Sexual Activity: Not on file     Other Topics Concern   . Not on file     Social History Narrative       Meds     Home :   Prior to Admission medications    Medication Sig Start Date End Date Taking? Authorizing Provider   aspirin 325 MG tablet Take 1 tablet (325 mg total) by mouth daily. 10/09/13   Nani Gasser, NP   atorvastatin (LIPITOR) 40 MG tablet Take 40 mg by mouth daily.    [provider]   Azelastine-Fluticasone 137-50 MCG/ACT Suspension by Nasal route daily as needed.       [provider]   budesonide-formoterol (SYMBICORT) 80-4.5 MCG/ACT inhaler Inhale 2 puffs into the lungs 2 (two) times daily as needed.       [provider]   cabergoline (DOSTINEX) 0.5 MG tablet Take 0.25 mg by mouth twice a week.    [provider]   fluticasone (VERAMYST) 27.5 MCG/SPRAY nasal spray 2 sprays by Nasal route as needed.       [provider]   fluticasone-salmeterol (ADVAIR DISKUS) 250-50 MCG/DOSE Aerosol Powder, Breath Activtivatede Inhale 1 puff into the lungs 2 (two) times daily as needed.    [provider]   metoprolol (LOPRESSOR) 25 MG tablet  Take 0.5 tablets (12.5 mg total) by mouth every 12 (twelve) hours. 10/09/13   Nani Gasser, NP   orlistat (XENICAL) 120 MG capsule Take 120 mg by mouth 3 (three) times daily as needed.       [provider]   PARoxetine (PAXIL) 30 MG tablet Take 30 mg by  mouth nightly.       [provider]   Vitamin D, Ergocalciferol, (DRISDOL) 50000 UNIT Cap Take 50,000 Units by mouth every 30 (thirty) days.       [provider]   acetaminophen (TYLENOL) 325 MG tablet Take 2 tablets (650 mg total) by mouth every 4 (four) hours as needed. 10/09/13 12/06/13  Nani Gasser, NP   amiodarone (PACERONE) 200 MG tablet Take 1 tablet (200 mg total) by mouth daily. 10/09/13 12/06/13  Nani Gasser, NP   benzonatate (TESSALON) 100 MG capsule Take 1 capsule (100 mg total) by mouth 3 (three) times daily as needed for Cough. 10/09/13 12/06/13  Nani Gasser, NP   HYDROcodone-acetaminophen (NORCO) 5-325 MG per tablet Take 1-2 tablets by mouth every 4 (four) hours as needed. 10/09/13 12/06/13  Nani Gasser, NP   senna-docusate (PERICOLACE) 8.6-50 MG per tablet Take 1 tablet by mouth nightly. 10/09/13 12/06/13  Nani Gasser, NP      Inpatient :   Current Facility-Administered Medications   Medication Dose Route Frequency   . aspirin  81 mg Oral Once   . [START ON 12/07/2013] aspirin  325 mg Oral Daily   . atorvastatin  40 mg Oral QHS   . heparin (porcine)  5,000 Units Subcutaneous Q8H SCH   . metoprolol  12.5 mg Oral Q12H   . morphine  2 mg Intravenous Once   . PARoxetine  30 mg Oral QHS   . Vitamin D (Ergocalciferol)  50,000 Units Oral Q30 Days         Allergies    Review of patient's allergies indicates no known allergies.      Review of Systems   All other systems were reviewed and are negative except for that mentioned in the HPI    Physical Exam:   Temp:  [97.9 F (36.6 C)-98.2 F (36.8 C)] 97.9 F (36.6 C)  Heart Rate:  [62-80] 68  Resp Rate:  [16-18] 18  BP: (104-167)/(76-89) 145/89 mmHg     Vital Signs:  Reviewed    General: The patient was well developed and well nourished.  No acute distress. Cooperative with the exam  Neck:  no carotid bruits  CVS: RRR, no murmurs, rubs,or gallops  Resp: CTA bilaterally, no retractions  Abd:  Soft, nontender  Extremities: no pedal edema, extremities normal in color    Mental Status: The patient was awake, alert and oriented to person, place, and time.  Affect is normal  Fund of knowledge appropriate  Recent and remote memory are intact   Attention span and concentration appear normal.  Language function is normal. There is no evidence of aphasia in conversational speech.    Cranial nerves:   -CN II: Visual fields full to bedside confrontation   -CN III, IV, VI: Pupils equal, round, and reactive to light; extraocular movements intact; no ptosis              -CN V: Facial sensation intact in V1 through V3 distributions   -CN VII: Face symmetric   -CN VIII: Hearing intact to conversational speech   -CN IX, X: Palate elevates symmetrically; normal  phonation   -CN XI: Symmetric full strength of sternocleidomastoid and trapezius muscles   -CN XII: Tongue protrudes midline    Motor: Muscle tone normal without spasticity or flaccidity. No atrophy.  No fasiculations. No pronator drift.  Strength  R / L    R / L  Deltoid  5 / 5  Hip Flexion 5 / 5  Triceps  5 / 5   Hip extension 5 / 5  Biceps  5 / 5   Knee flexion 5 / 5  Wrist ext 5 / 5  Knee ext 5 / 5  Wrist flexion 5 / 5  Dorsiflexion 5 / 5  FF   5 / 5  Plantar flexion 5 / 5    Sensory:   Light touch intact.  Pinprick intact.  Temperature intact.    Reflexes: DTR's 2+, flexor plantars    Coordination: FTN intact, no truncal ataxia. RAMs intact. No tremors    Gait: Station normal, gait stable   Tandem walk intact.       Labs:     Results     Procedure Component Value Units Date/Time    Troponin I [[id number]] Collected:  12/06/13 0855    Specimen Information:  Blood Updated:  12/06/13 0924     Troponin I <0.01 ng/mL     Comprehensive metabolic panel [[id number]]  (Abnormal) Collected:  12/06/13 0855    Specimen Information:  Blood Updated:  12/06/13 0919     Glucose 84 mg/dL      BUN 78.2 mg/dL      Creatinine 1.1 (H) mg/dL      Sodium 956 mEq/L      Potassium 4.8  mEq/L      Chloride 108 mEq/L      CO2 24 mEq/L      CALCIUM 9.7 mg/dL      Protein, Total 7.4 g/dL      Albumin 4.4 g/dL      AST (SGOT) 25 U/L      ALT 17 U/L      Alkaline Phosphatase 92 U/L      Bilirubin, Total 0.8 mg/dL      Globulin 3.0 g/dL      Albumin/Globulin Ratio 1.5      Anion Gap 8.0     Hemolysis index [[id number]]  (Abnormal) Collected:  12/06/13 0855     Hemolysis Index 22 (H) Updated:  12/06/13 0919    GFR [[id number]] Collected:  12/06/13 0855     EGFR >60.0 Updated:  12/06/13 0919    CBC and differential [[id number]] Collected:  12/06/13 0855    Specimen Information:  Blood / Blood Updated:  12/06/13 0904     WBC 5.29 x10 3/uL      RBC 4.44 x10 6/uL      Hgb 13.8 g/dL      Hematocrit 27.2 %      MCV 94.8 fL      MCH 31.1 pg      MCHC 32.8 g/dL      RDW 12 %      Platelets 254 x10 3/uL      MPV 10.6 fL      Neutrophils 37 %      Lymphocytes Automated 52 %      Monocytes 7 %      Eosinophils Automated 3 %      Basophils Automated 0 %      Immature Granulocyte 0 %      Nucleated RBC 0 /  100 WBC      Neutrophils Absolute 1.98 x10 3/uL      Abs Lymph Automated 2.76 x10 3/uL      Abs Mono Automated 0.36 x10 3/uL      Abs Eos Automated 0.17 x10 3/uL      Absolute Baso Automated 0.02 x10 3/uL      Absolute Immature Granulocyte 0.01 x10 3/uL           Rads:     Results for orders placed or performed during the hospital encounter of 10/01/13   MRI Brain WO Contrast    Narrative    MRI BRAIN WO CONTRAST:10/04/2013 7:43 AM     CLINICAL HISTORY:    .arm /face numbness    TECHNIQUE: Noncontrast Brain MR    CONTRAST: None    COMPARISON: None.    FINDINGS:     Extra axial spaces: Normal in size and morphology for the patient's age.  Hemorrhage: None.  Ventricular system: Normal in size and morphology for the patient's age.  Basal cisterns: Normal.  Cerebral parenchyma: There are several punctate regions of high T2 and  FLAIR signal intensity seen in the subcortical white matter of the  frontal lobes bilaterally  without underlying diffusion restriction or  mass effect or edema.  Midline shift: None.  Cerebellum: Normal.  Brainstem: Normal.    OTHER:    Calvarium: Unremarkable. Incidental note is made of multiple regions of  susceptibility overlying the scalp which may be in the patient's hair or  due to multiple scalp calcifications  Vascular system: Normal.  Visualized Paranasal sinuses: Clear.  Visualized Orbits: Normal.  Visualized upper cervical spine: Normal.  Sella and skull base: Normal.      Impression    Minimal nonspecific bilateral frontal white matter changes without mass  effect or edema or acute infarct. Otherwise unremarkable    Laurena Slimmer, MD   10/04/2013 9:48 AM     MR Angiogram Head WO Contrast    Narrative    History: Right face and arm numbness     3D time-of-flight intracranial MRA was performed, which resulted in good  visualization of the upper cervical, petrous, cavernous, and  supraclinoid portions of both carotid arteries, as well as the basilar  artery.  Anterior, posterior and middle cerebral arteries are visualized  to their second order of branching, and no evidence of stenosis or  obvious occlusion is seen.      Impression     Negative intracranial MRA.        Laurena Slimmer, MD   10/04/2013 9:49 AM         Katheren Shams, FNP-C  Nurse Practitioner  [facility]  Daytime: [id number]  Consult requests: [phone]  After 5:00 pm: [phone]

## 2013-12-06 NOTE — Plan of Care (Signed)
Problem: Safety  Goal: Patient will be free from injury during hospitalization  Outcome: Progressing  Intervention: Provide alternative method of communication if needed (communication boards, writing).  Intentional hourly rounding and neuro assessment  Will continue pt  Verbalizes understanding to the plan.

## 2013-12-06 NOTE — H&P (Signed)
Full note dictated #[id number]    Assessment :  Principal Problem:    Chest pain  Active Problems:    S/P CABG x 2    Substernal chest pain    HTN (hypertension)    Hyperlipidemia    Coronary artery disease involving autologous vein bypass graft    Numbness and tingling of right arm    Numbness of lip        Plan:  Serial enzyme   Cardiology/cardiac surgery consult  Neurology consult  Resume home meds        Christophe Louis, DO

## 2013-12-06 NOTE — H&P (Signed)
Patient Type: I     ATTENDING PHYSICIAN: Vedia Coffer, DO     CHIEF COMPLAINT:  Chest pain.     HISTORY OF PRESENT ILLNESS:  A 53 year old African American female with past medical history of coronary  artery disease status post CABG, history of pituitary gland tumor,  hypertension, hyperlipidemia, underwent CABG in September and presented  with complaint of chest pain, right arm numbness and numbness of the lips.   The patient stated that she started on cardiac rehabilitation 4 days ago  and had Thursday and Friday cardiac rehabilitation.  She went to church on  Sunday, and this morning in cardiac rehabilitation, after 30 minutes of  treadmill, she was exercising on bike when she started having chest pain.   The patient stated that she had numbness of the right arm this morning and  numbness of the lips going on for 2 weeks.  She received aspirin 324 mg.   The patient stated that she had taken her 324 mg aspirin this morning.  She  was given sublingual nitroglycerin and her pain improved.  Cardiac surgery  was called and they wanted to monitor the patient in the hospital.  The  patient denies fever or chills.  Denies nausea, vomiting, or shortness of  breath.     PAST MEDICAL HISTORY:  History of pituitary tumor, hypertension, hyperlipidemia, coronary artery  disease status post CABG in September.     PAST SURGICAL HISTORY:  History of toe surgery, appendectomy, history of fibroid surgery, history  of coronary artery bypass graft in September 2015.     SOCIAL HISTORY:  She denies tobacco, occasional alcohol intake, no illicit drug use.     FAMILY HISTORY:  Mother had coronary artery disease and MI at age 60, hypertension.  No  history of stroke, diabetes, or cancer in the family.     PRIMARY CARE PHYSICIAN:  The patient follows with me.       CARDIOLOGIST:  Dr. Lovina Reach      CARDIOTHORACIC:    Dr. Tanda Rockers     HOME MEDICATIONS:  Include aspirin 325 mg daily, Lipitor 40 mg daily, Azelastine nasal spray  as needed,  Senokot as needed, cabergoline 0.25 mg twice a week, Veramyst  nasal spray as needed, Advair 250/50 as needed, metoprolol 0.5 mg twice  daily, Xenical 120 mg capsule 3 times daily as needed when she takes  high-fat foods, Paxil 30 mg at bedtime, vitamin D 50,000 once a month.     ALLERGIES:  The patient has no known drug allergies.     REVIEW OF SYSTEMS:  CONSTITUTIONAL:  Negative for fever or chills.  HEENT:  Negative for sore throat, headache, congestion.  Positive for lip  numbness.  RESPIRATORY:  Negative for cough, shortness of breath.  CARDIOVASCULAR:  Positive for chest pain, which is reproducible.  No  shortness of breath.  GASTROINTESTINAL:  Negative for nausea, vomiting, diaphoresis.  MUSCULOSKELETAL:  Negative for joint pain, but positive for pain at the  incision site prior CABG surgery.  NEUROLOGIC:  Negative for weakness.  Positive numbness of the lips and  right hand numbness.    Rest of the review of systems is negative.     PHYSICAL EXAMINATION:  VITAL SIGNS:  At the time of arrival, blood pressure was 167/87 with heart  rate 62.  At the time of my examination, blood pressure 145/89, heart rate  68, respirations 18, temperature 97.9, oxygen saturation 99%.  GENERAL:  The patient is well  developed, well nourished, not in acute  distress.  HEENT:  Normocephalic, atraumatic.  Pupils equal, reacting to light  bilaterally.  Nasopharynx clear.  Hearing intact.  Oropharynx clear.   Mucous membranes moist.  NECK:  Supple, no JVD, no lymphadenopathy.  LUNGS:  Clear to auscultation bilaterally, no wheeze noted.  CARDIAC:  Normal S1, S2.  ABDOMEN:  Soft, nondistended, bowel sounds positive.  EXTREMITIES:  No clubbing, cyanosis, or edema.    CHEST WALL:  The patient has reproducible pain on the mediastinal area at  the surgical site.  NEUROLOGIC:  The patient is alert, awake, oriented x3.  Able to move all 4  extremities.  The patient had numbness of the right as well the lip area,   but intact on fine touch.      LABORATORY AND DIAGNOSTIC DATA:   Revealed white count of 5.29, hemoglobin 13.8, hematocrit 42.1, platelets  of 254.  Chemistry with sodium 140, potassium 4.8, chloride 108,  bicarbonate 24, BUN 13, creatinine 1.1, glucose 84, calcium 9.7, AST 25,  ALT 17, alkaline phosphatase 92, total bilirubin 0.8, total protein 7.4,  albumin 4.4, globulin 3.  Troponin was less than 0.01.     Chest x-ray with no active disease seen in the chest.     ASSESSMENT AND PLAN:  A 53 year old who presented with chest pain while on the cardiac  rehabilitation.  1.  Chest pain, seems atypical, initial set of cardiac enzyme is negative,  will do serial cardiac enzymes.  Cardiology consulted as well as cardiac  surgery.  Further recommendation as per cardiology and cardiac surgeon.  2.  Coronary artery disease status post coronary artery bypass grafting.  3.  Hypertension, stable.  4.  Hyperlipidemia.  Continue statin.  5.  Numbness of the arm and legs.  Will consult neurology.  The patient has  seen Dr. Cathren Laine at the [facility] neurolog[facility] group last week.           D:  12/06/2013 11:47 AM by Dr. Signa Kell. [place], [place] [id number]  T:  12/06/2013 13:27 PM by       Everlean Cherry: [id number]) (Doc ID: [id number])

## 2013-12-06 NOTE — Plan of Care (Signed)
Problem: Health Promotion  Goal: Knowledge - health resources  Extent of understanding and conveyed about healthcare resources.   Patient admitted because of chest pain which she experienced while at cardiac rehab this am, the only other symptom she states is some numbness of her lips, which she states had been present and disappeared since her surgery. She also experienced left arm pain and that has resolved will continue to monitor.

## 2013-12-07 LAB — ECG 12-LEAD
Atrial Rate: 68 {beats}/min
P Axis: 60 degrees
P-R Interval: 188 ms
Q-T Interval: 448 ms
QRS Duration: 88 ms
QTC Calculation (Bezet): 476 ms
R Axis: -55 degrees
T Axis: 64 degrees
Ventricular Rate: 68 {beats}/min

## 2013-12-07 LAB — TROPONIN I: Troponin I: <0.01 ng/mL (ref 0.00–0.09)

## 2013-12-07 NOTE — Progress Notes (Addendum)
Pt alert and oriented upon discharge. Chest pain and follow up education given to patient. No chest discomfort or pain upon leaving. Normal vital signs and sinus rhythm on the monitor. IV and cardiac monitor removed.

## 2013-12-07 NOTE — Discharge Summary (Signed)
DISCHARGE SUMMARY    Date Time: 12/07/2013 8:41 AM  Patient Name: [name]  Attending Physician: Christophe Louis, DO    Date of Admission:   12/06/2013    Date of Discharge:   12/07/2013    Reason for Admission:   Substernal chest pain [R07.2]    Discharge Dx:   Marland Kitchen Chest pain- atypical   . HTN (hypertension)  . Hyperlipidemia  . Coronary artery disease s/p CABG  . Numbness and tingling of right arm- resolved  . Numbness of lip- resolved    Consultations:   Fortino Sic, MD  Laurie Panda, MD    Discharge Medications:        Discharge Medication List      Taking          aspirin 325 MG tablet   Dose:  325 mg   Take 1 tablet (325 mg total) by mouth daily.       atorvastatin 40 MG tablet   Dose:  40 mg   Commonly known as:  LIPITOR   Take 40 mg by mouth daily.       Azelastine-Fluticasone 137-50 MCG/ACT Susp   - by Nasal route daily as needed.     -        budesonide-formoterol 80-4.5 MCG/ACT inhaler   Dose:  2 puff   Commonly known as:  SYMBICORT   - Inhale 2 puffs into the lungs 2 (two) times daily as needed.     -        cabergoline 0.5 MG tablet   Dose:  0.25 mg   Commonly known as:  DOSTINEX   Take 0.25 mg by mouth twice a week.       fluticasone 27.5 MCG/SPRAY nasal spray   Dose:  2 spray   Commonly known as:  VERAMYST   - 2 sprays by Nasal route as needed.     -        fluticasone-salmeterol 250-50 MCG/DOSE Aepb   Dose:  1 puff   Commonly known as:  ADVAIR DISKUS   Inhale 1 puff into the lungs 2 (two) times daily as needed.       metoprolol 25 MG tablet   Dose:  12.5 mg   Commonly known as:  LOPRESSOR   Take 0.5 tablets (12.5 mg total) by mouth every 12 (twelve) hours.       orlistat 120 MG capsule   Dose:  120 mg   Commonly known as:  XENICAL   - Take 120 mg by mouth 3 (three) times daily as needed.     -        PARoxetine 30 MG tablet   Dose:  30 mg   Commonly known as:  PAXIL   - Take 30 mg by mouth nightly.     -        Vitamin D (Ergocalciferol) 50000 UNIT Caps   Dose:  50000 Units   Commonly  known as:  DRISDOL   - Take 50,000 Units by mouth every 30 (thirty) days.     [facility] Course:   Please see admission H and P and consult for details.  The patient is a 53 year old female with history of coronary artery disease status post coronary artery bypass graft, presented with chest pain.  Cardiology consulted and serial enzyme remained negative and clear from cardiology standpoint for discharge.  Patient  also has numbness of the lips and right arm which was resolved.  Neurology has seen the patient.  Patient needs follow-up with the neurology as an outpatient and further workup as an outpatient.  Other issues remained stable and discharged home in a stable condition.      Laboratory Data     CBC    Recent Labs  Lab 12/06/13  0855   WBC 5.29   HEMOGLOBIN 13.8   HEMATOCRIT 42.1   PLATELETS 254   MCV 94.8   NEUTROPHILS 37       CMP    Recent Labs  Lab 12/06/13  0855   SODIUM 140   POTASSIUM 4.8   CHLORIDE 108   CO2 24   BUN 13.0   CREATININE 1.1*   GLUCOSE 84   CALCIUM 9.7   PROTEIN, TOTAL 7.4   ALBUMIN 4.4   AST (SGOT) 25   ALT 17   ALKALINE PHOSPHATASE 92   BILIRUBIN, TOTAL 0.8     Cardiac enzymes    Recent Labs  Lab 12/07/13  0335 12/06/13  1710 12/06/13  0855   TROPONIN I <0.01 <0.01 <0.01     All radiology result for current encounter  Xr Chest Ap Portable    12/06/2013    No active disease is seen in the chest.  Laurena Slimmer, MD  12/06/2013 9:26 AM         Physical Exam:   BP 126/88 mmHg  Pulse 57  Temp(Src) 98.4 F (36.9 C) (Oral)  Resp 17  Ht 1.651 m (5\' 5" )  Wt 78.019 kg (172 lb)  BMI 28.62 kg/m2  SpO2 100%    General appearance - alert, and in no distress  HEENT: Normocephalic,atraumatic, pupil equal  Neck - supple  Chest - clear to auscultation  Heart - normal rate and regular rhythm  Abdomen - bowel sounds normal, soft, non distended  Extremities - no pedal edema  Neurological - Alert      Discharge condition:   Stable     Discharge  Diet :   Cardiac     Discharge  Instructions:   Follow up:     Christophe Louis, DO  [address]  [place] [address]  [phone]    Schedule an appointment as soon as possible for a visit in 2 weeks      Alver Sorrow, MD  [address]  [place] [address]  [phone]    Schedule an appointment as soon as possible for a visit in 1 week      Leona Carry, MD  [address]  [place] [address]  [phone]    Schedule an appointment as soon as possible for a visit in 1 week          Christophe Louis, DO  12/07/2013  8:41 AM  Time spent 35 minutes

## 2013-12-07 NOTE — Plan of Care (Signed)
The patient and care giver's learning abilities have been assessed. Today's individualized plan of care was discussed with Roberts Gaudy. Patient demonstrates understanding of disease process, treatment plan, medications and consequences of noncompliance. All questions and concerns were addressed. Continue monitoring vital signs. Continue pain management, neurological assessment and monitoring labs. Keep alarm on and educate patient about hourly rounding. Assist patient when OOB.

## 2013-12-07 NOTE — Progress Notes (Signed)
[facility] Progress Note    Date Time: 12/07/2013 10:05 AM  Patient Name: [name], [name]    CC:   Chief Complaint   Patient presents with   . Chest Pain         Assessment:   Chronic intermittent perioral numbness and RUE pain in a 53 y.o. female with a PMH significant for pituitary tumor, hyperlipidemia, hypertension, and CABG 2 approximately 2 months ago. 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 Etiology of her perioral numbness is unclear however cannot exclude a component of anxiety. Workup negative for AMI.    Patient Active Problem List   Diagnosis   . Chest pain of uncertain etiology   . Elevated BP   . Anxiety   . Elevated coronary artery calcium score   . Right arm pain   . S/P CABG x 2   . Substernal chest pain   . HTN (hypertension)   . Hyperlipidemia   . Coronary artery disease involving autologous vein bypass graft   . Numbness and tingling of right arm   . Numbness of lip   . Chest pain       Plan:   -OK to discharge from a neuro standpoint.   -Can follow-up with Dr Cathren Laine  In 2-3 weeks and consider MRI C-spine as an outpatient.      Attending note:    The patient was seen and examined by me. History was independently reviewed and confirmed by me. All pertinent parts of the neurological exam were performed and confirmed by me, with any additional findings on exam noted in bold. I agree with the assessment and plan as outlined by the mid-level provider as above, with any additional considerations or recommendations as noted:     No new events.  Cardiac w/u normal.  RUE pain did not return.  No further inpatient neurological workup necessary; can continue neurology workup as outpatient as directed.      Johnston Ebbs, MD  [facility]          Interval History/Subjective:   No acute events documented or reported overnight  Chest pain resolved. Negative AMI  Denies recurrence of RUE pain while sleeping last night  No current complaints of perioral numbness    Denies  headache, nausea, weakness, paresthesias, visual changes      Medications:     Current Facility-Administered Medications   Medication Dose Route Frequency   . aspirin  325 mg Oral Daily   . atorvastatin  40 mg Oral QHS   . heparin (porcine)  5,000 Units Subcutaneous Q8H SCH   . metoprolol  12.5 mg Oral Q12H   . morphine  2 mg Intravenous Once   . PARoxetine  30 mg Oral QHS   . Vitamin D (Ergocalciferol)  50,000 Units Oral Q30 Days       Review of Systems:   All other systems were reviewed and are otherwise negative except as referenced in the above interval history above.          Physical Exam:   Temp:  [96.8 F (36 C)-98.4 F (36.9 C)] 98.4 F (36.9 C)  Heart Rate:  [57-80] 57  Resp Rate:  [16-18] 17  BP: (114-145)/(70-89) 126/88 mmHg    Vital Signs:  Reviewed    General: The patient was well developed and well nourished.  No acute distress. Cooperative with the exam  Extremities: no pedal edema, extremities normal in color    Mental  Status: The patient was awake, alert and oriented to person, place, and time.  Affect is normal  Fund of knowledge appropriate  Recent and remote memory are intact   Attention span and concentration appear normal.  Language function is normal. There is no evidence of aphasia in conversational speech.    Cranial nerves:   -CN II: Visual fields full to bedside confrontation   -CN III, IV, VI: Pupils equal, round, and reactive to light; extraocular movements intact; no ptosis              -CN V: Facial sensation intact in V1 through V3 distributions   -CN VII: Face symmetric   -CN VIII: Hearing intact to conversational speech   -CN IX, X: Palate elevates symmetrically; normal phonation   -CN XI: Symmetric full strength of sternocleidomastoid and trapezius muscles   -CN XII: Tongue protrudes midline    Motor: Muscle tone normal without spasticity or flaccidity. No atrophy.  No fasiculations. No pronator drift.  Strength 5 / 5    Sensory: Light touch intact.    Reflexes: DTR's 2+,  flexor plantars    Coordination: FTN intact. No tremors    Gait: Station normal, gait stable         Labs:     Results     Procedure Component Value Units Date/Time    Troponin I [[id number]] Collected:  12/07/13 0335    Specimen Information:  Blood Updated:  12/07/13 0659     Troponin I <0.01 ng/mL     Narrative:      Start from the time of first draw    Troponin I [[id number]] Collected:  12/07/13 0335    Specimen Information:  Blood Updated:  12/07/13 0335    Narrative:      Start from the time of first draw    Troponin I [[id number]] Collected:  12/06/13 1710    Specimen Information:  Blood Updated:  12/06/13 1756     Troponin I <0.01 ng/mL     Narrative:      Start from the time of first draw          Rads:   Xr Chest Ap Portable    12/06/2013    No active disease is seen in the chest.  Laurena Slimmer, MD  12/06/2013 9:26 AM         Signed by:  Katheren Shams, FNP-C  Nurse Practitioner  [facility]  Daytime: [address]  Consult requests: [phone]  After 5:00 pm: [phone]

## 2013-12-07 NOTE — Discharge Instructions (Signed)
Chest Pain, Uncertain Cause  Chest pain can happen for a number of reasons. Sometimes the cause can not be determined. If yourcondition does not seem serious, and your pain does not appear to be coming from your heart, your doctor may recommend watching it closely. Sometimes the signs of a serious problem take more time to appear. Therefore, watch for the warning signs listed below.  Home care  After your visit, follow these recommendations:   Rest today and avoid strenuous activity.   Take any prescribed medicine as directed.  Follow-up care  Follow up with your doctor or this facility as instructed or if you do not start to feel better within 24 hours.  Call 911  Get immediate medical attention if any of the following occur:   A change in the type of pain: if it feels different, becomes more severe, lasts longer, or begins to spread into your shoulder, arm, neck, jaw or back   Shortness of breath or increased pain with breathing   Weakness, dizziness, or fainting   Rapid heart beat  Get prompt medical attention  Call your doctor right away if any of the following occur:   Cough with dark colored sputum (phlegm) or blood   Fever of 100.4F(38C) or higher, or as directed by your health care provider   Swelling, pain or redness in one leg   2000-2015 [employer] [address], [place] [address]. All rights reserved. This information is not intended as a substitute for professional medical care. Always follow your healthcare professional's instructions.

## 2013-12-08 ENCOUNTER — Ambulatory Visit: Payer: BLUE CROSS/BLUE SHIELD

## 2013-12-09 ENCOUNTER — Ambulatory Visit: Payer: BLUE CROSS/BLUE SHIELD

## 2013-12-10 ENCOUNTER — Ambulatory Visit: Payer: BLUE CROSS/BLUE SHIELD

## 2013-12-10 ENCOUNTER — Inpatient Hospital Stay
Admission: RE | Admit: 2013-12-10 | Discharge: 2013-12-10 | Disposition: A | Discharge status: Home / Self Care | Payer: BLUE CROSS/BLUE SHIELD | Source: Ambulatory Visit

## 2013-12-13 ENCOUNTER — Ambulatory Visit: Payer: BLUE CROSS/BLUE SHIELD

## 2013-12-15 ENCOUNTER — Ambulatory Visit: Payer: BLUE CROSS/BLUE SHIELD

## 2013-12-17 ENCOUNTER — Ambulatory Visit: Payer: BLUE CROSS/BLUE SHIELD

## 2013-12-20 ENCOUNTER — Inpatient Hospital Stay
Admission: RE | Admit: 2013-12-20 | Discharge: 2013-12-20 | Disposition: A | Discharge status: Home / Self Care | Payer: BLUE CROSS/BLUE SHIELD | Source: Ambulatory Visit

## 2013-12-20 ENCOUNTER — Ambulatory Visit: Payer: BLUE CROSS/BLUE SHIELD

## 2013-12-21 ENCOUNTER — Encounter (INDEPENDENT_AMBULATORY_CARE_PROVIDER_SITE_OTHER): Payer: Self-pay | Admitting: Cardiovascular Disease

## 2013-12-22 ENCOUNTER — Encounter
Admission: RE | Disposition: A | Discharge status: Home / Self Care | Payer: Self-pay | Source: Ambulatory Visit | Attending: Cardiovascular Disease

## 2013-12-22 ENCOUNTER — Ambulatory Visit
Admission: RE | Admit: 2013-12-22 | Discharge: 2013-12-22 | Disposition: A | Discharge status: Home / Self Care | Payer: BLUE CROSS/BLUE SHIELD | Source: Ambulatory Visit | Attending: Cardiovascular Disease | Admitting: Cardiovascular Disease

## 2013-12-22 ENCOUNTER — Telehealth: Payer: Self-pay

## 2013-12-22 ENCOUNTER — Inpatient Hospital Stay: Admission: RE | Admit: 2013-12-22 | Payer: BLUE CROSS/BLUE SHIELD | Source: Ambulatory Visit

## 2013-12-22 ENCOUNTER — Ambulatory Visit: Payer: BLUE CROSS/BLUE SHIELD | Admitting: Cardiovascular Disease

## 2013-12-22 DIAGNOSIS — I10 Essential (primary) hypertension: Secondary | ICD-10-CM | POA: Insufficient documentation

## 2013-12-22 DIAGNOSIS — I2510 Atherosclerotic heart disease of native coronary artery without angina pectoris: Secondary | ICD-10-CM | POA: Insufficient documentation

## 2013-12-22 DIAGNOSIS — I25810 Atherosclerosis of coronary artery bypass graft(s) without angina pectoris: Secondary | ICD-10-CM | POA: Insufficient documentation

## 2013-12-22 DIAGNOSIS — E785 Hyperlipidemia, unspecified: Secondary | ICD-10-CM | POA: Insufficient documentation

## 2013-12-22 DIAGNOSIS — Z951 Presence of aortocoronary bypass graft: Secondary | ICD-10-CM | POA: Insufficient documentation

## 2013-12-22 DIAGNOSIS — I25111 Atherosclerotic heart disease of native coronary artery with angina pectoris with documented spasm: Secondary | ICD-10-CM

## 2013-12-22 DIAGNOSIS — I208 Other forms of angina pectoris: Secondary | ICD-10-CM | POA: Insufficient documentation

## 2013-12-22 DIAGNOSIS — I25709 Atherosclerosis of coronary artery bypass graft(s), unspecified, with unspecified angina pectoris: Secondary | ICD-10-CM

## 2013-12-22 SURGERY — LEFT HEART CATH POSS PCI
Laterality: Left

## 2013-12-22 MED ORDER — SODIUM CHLORIDE 0.9 % IV SOLN
INTRAVENOUS | Status: DC
Start: 2013-12-22 — End: 2013-12-22

## 2013-12-22 MED ORDER — MIDAZOLAM HCL 2 MG/2ML IJ SOLN
INTRAMUSCULAR | Status: AC
Start: 2013-12-22 — End: 2013-12-22
  Administered 2013-12-22: 1 mg via INTRAVENOUS
  Filled 2013-12-22: qty 2

## 2013-12-22 MED ORDER — LIDOCAINE HCL (PF) 1 % IJ SOLN
INTRAMUSCULAR | Status: AC
Start: 2013-12-22 — End: 2013-12-22
  Administered 2013-12-22: 10 mL via INTRADERMAL
  Filled 2013-12-22: qty 30

## 2013-12-22 MED ORDER — FENTANYL CITRATE 0.05 MG/ML IJ SOLN
INTRAMUSCULAR | Status: AC
Start: 2013-12-22 — End: 2013-12-22
  Administered 2013-12-22: 50 ug via INTRAVENOUS
  Filled 2013-12-22: qty 2

## 2013-12-22 MED ORDER — NITROGLYCERIN IN D5W 200-5 MCG/ML-% IV SOLN
INTRAVENOUS | Status: AC
Start: 2013-12-22 — End: 2013-12-22
  Administered 2013-12-22: 200 ug via INTRA_ARTERIAL
  Filled 2013-12-22: qty 10

## 2013-12-22 MED ORDER — HEPARIN 5 UNIT/ML PRIMING SOLUTION 1000 ML (CRRT)
Status: AC
Start: 2013-12-22 — End: 2013-12-22
  Filled 2013-12-22: qty 1000

## 2013-12-22 MED ORDER — LIDOCAINE HCL (PF) 1 % IJ SOLN
INTRAMUSCULAR | Status: DC
Start: 2013-12-22 — End: 2013-12-22
  Filled 2013-12-22: qty 30

## 2013-12-22 MED ORDER — IODIXANOL 320 MG/ML IV SOLN
125.0000 mL | Freq: Once | INTRAVENOUS | Status: AC | PRN
Start: 2013-12-22 — End: 2013-12-22
  Administered 2013-12-22: 125 mL via INTRA_ARTERIAL

## 2013-12-22 NOTE — Procedures (Signed)
Left Heart Catheterization      Cardiac Catheterization Report    [name]  53 y.o.  female    PreOpDiagnosis:   1. CAD s/p CABG    PostOp Diagnosis:  1. S/p LIMA to LAD, SVG to OM    Procedure:  Cor angio, SVG, LV     Plan:  Medical rx for CAD and aggressive lipid control. F/u Dr Lovina Reach.            Fortino Sic, MD  11:20 AM 12/22/2013

## 2013-12-22 NOTE — Discharge Instructions (Signed)
[address]  [place], [place] [address]  (phone) [phone]   (fax) [phone]        CARDIAC CATHETERIZATION DISCHARGE INSTRUCTIONS    Physician name/number       Your physician performed a cardiac catheterization on you today.  Please follow these instructions and call the number above for any problems encountered after discharge.    ACTIVITY:  ? Limit your activity to staying in bed or recliner, etc.    ? Limit trips of walking up or down stairs.  ? You may walk as needed to the bathroom  ? Avoid strenuous activity (bending, bicycling, jogging, heavy lifting)  ? No driving and no signing of a legal document for 24 hours    DIET:  ? Resume your routine diet.   ? Drink plenty of fluids  ? Avoid alcohol for at least 24 hours.    MEDICATIONS:  ? Resume your routine medications  ? Do not take metformin for 48 hours after procedure.     NEW MEDICATIONS AND INSTRUCTIONS:  ?     ?       ?      POSSIBLE COMPLICATIONS:  ? External bleeding or a lump forming at the puncture site, numbness or extreme pain in leg or foot associated with procedure, chest pain or shortness of breath  ? Apply firm pressure to puncture site for bleeding or lump formation (hematoma)  ? CALL 911 FOR THE ABOVE AND FOR ACUTE CHEST PAIN OR SHORTNESS OF BREATH  ? Notify your physician for any other non-acute problems    DRESSING:  ? You may remove dressing with shower in the morning.  If site remains slightly open, cover with a band-aid  ? If a special closure device was used, follow instructions given    FOLLOW-UP INSTRUCTIONS:

## 2013-12-22 NOTE — H&P (Signed)
See scanned HP and I have reviewed it and no changes are noted.  Rodman Key, MD

## 2013-12-22 NOTE — Progress Notes (Signed)
ASA      Indication(s):   ( ) Failed stress test  ( ) Chest pain / Angina  ( ) Abnormal EKG  x Known CAD  ( ) Heart Failure / post Heart Transplant  ( ) Other:    Review of systems performed:   Yes  (x)         Allergies:     No Known Allergies      Most recent labs:    If available in Epic:     Lab Results   Component Value Date    WBC 5.29 12/06/2013    HGB 13.8 12/06/2013    HCT 42.1 12/06/2013    MCV 94.8 12/06/2013    PLT 254 12/06/2013        No results for input(s): NA, K, CL, CO2, BUN, CREAT, EGFR, GLU, CA, ALB, PHOS in the last 168 hours.    If unavailable in Epic, present in paper chart.    ASA Physical Status:   (  )  ASA 1  Healthy patient    x  ASA 2  Mild systemic illness    (  )  ASA 3  Systemic disease, though not incapacitating    (  )  ASA 4  Severe systemic disease that is a constant threat to life     (  )  ASA 5  Moribund condition, patient unexpected to live >24 hours, irrespective of    procedure    (  )  E       Emergent procedure    Planned sedation:   (  ) No sedation  (x) Moderate sedation  (  ) Deep sedation (with Anesthesiology present)      Conclusion:     This patient has been seen and examined immediately prior to the procedure, and I feel that they are an appropriate candidate for the planned procedure with the planned sedation.    The risks, benefits, and alternatives to the planned procedure and sedation have been explained to the patient or the patient's guardian.     The currently available history & physical has been reviewed, and there are no major changes.     Exceptions only in the case of emergency.         Fortino Sic, MD

## 2013-12-22 NOTE — Progress Notes (Signed)
Pt A&Ox3.  Pt rates pain 0/10 on pain scale.  Vital signs stable.  Site is dry and intact.  Pt ambulated to restroom.  No bleeding or hematoma noted.  Pt provided with lunch tray.  Pt and pt's husband provided with verbal and written discharge instructions.  All questions answered and both stated an understanding.  Iv d/c'd without complications.  Pt taken out front via wheelchair and discharged to husband.

## 2013-12-23 ENCOUNTER — Ambulatory Visit: Payer: BLUE CROSS/BLUE SHIELD

## 2013-12-24 ENCOUNTER — Ambulatory Visit: Payer: BLUE CROSS/BLUE SHIELD

## 2013-12-27 ENCOUNTER — Ambulatory Visit: Payer: BLUE CROSS/BLUE SHIELD

## 2013-12-29 ENCOUNTER — Ambulatory Visit: Payer: BLUE CROSS/BLUE SHIELD

## 2013-12-30 ENCOUNTER — Ambulatory Visit: Payer: BLUE CROSS/BLUE SHIELD

## 2013-12-31 ENCOUNTER — Ambulatory Visit: Payer: BLUE CROSS/BLUE SHIELD

## 2014-01-03 ENCOUNTER — Ambulatory Visit: Payer: BLUE CROSS/BLUE SHIELD

## 2014-01-03 ENCOUNTER — Other Ambulatory Visit: Payer: Self-pay | Admitting: Orthopaedic Surgery

## 2014-01-03 ENCOUNTER — Inpatient Hospital Stay
Admission: RE | Admit: 2014-01-03 | Discharge: 2014-01-03 | Disposition: A | Discharge status: Home / Self Care | Payer: BLUE CROSS/BLUE SHIELD | Source: Ambulatory Visit | Attending: Cardiovascular Disease | Admitting: Cardiovascular Disease

## 2014-01-03 VITALS — BP 110/66 | Wt 172.9 lb

## 2014-01-03 DIAGNOSIS — M5030 Other cervical disc degeneration, unspecified cervical region: Secondary | ICD-10-CM

## 2014-01-03 DIAGNOSIS — Z951 Presence of aortocoronary bypass graft: Secondary | ICD-10-CM

## 2014-01-03 DIAGNOSIS — M25511 Pain in right shoulder: Secondary | ICD-10-CM

## 2014-01-03 NOTE — Progress Notes (Signed)
Patient returns with clearance after leaving cardiac rehab on 11/16 with chest pain.  Experienced chest pain while on bike but it had resolved by the time patient was transported to the ER.  Had a cardiac catheterization which revealed patent vessels per Dr. Lovina Reach.  Started taking Isosorbide mononitrate.  Exercised w/o any s/s or decompensation.

## 2014-01-03 NOTE — Addendum Note (Signed)
Encounter addended by: Ronni Rumble I on: 01/03/2014  8:37 AM<BR>     Documentation filed: Questionnaires

## 2014-01-05 ENCOUNTER — Ambulatory Visit: Payer: BLUE CROSS/BLUE SHIELD

## 2014-01-05 ENCOUNTER — Inpatient Hospital Stay
Admission: RE | Admit: 2014-01-05 | Discharge: 2014-01-05 | Disposition: A | Discharge status: Home / Self Care | Payer: BLUE CROSS/BLUE SHIELD | Source: Ambulatory Visit

## 2014-01-05 VITALS — BP 104/68 | Wt 172.5 lb

## 2014-01-05 DIAGNOSIS — Z951 Presence of aortocoronary bypass graft: Secondary | ICD-10-CM

## 2014-01-06 ENCOUNTER — Inpatient Hospital Stay
Admission: RE | Admit: 2014-01-06 | Discharge: 2014-01-06 | Disposition: A | Discharge status: Home / Self Care | Payer: BLUE CROSS/BLUE SHIELD | Source: Ambulatory Visit

## 2014-01-06 ENCOUNTER — Ambulatory Visit: Payer: BLUE CROSS/BLUE SHIELD

## 2014-01-06 VITALS — BP 90/56 | Wt 171.2 lb

## 2014-01-06 DIAGNOSIS — Z951 Presence of aortocoronary bypass graft: Secondary | ICD-10-CM

## 2014-01-06 NOTE — Progress Notes (Signed)
Patient had prolonged cool-down today.  Was exercising slightly outside of THR based on stress test (94-103), asymptomatic.  RPM on bike was in the 90's, discussed decreasing the RPM's to see if this assists with speeding up her recovery time.  If her heart rate continues to trend upward, will contact cardiologist about going to age based heart rate range.

## 2014-01-07 ENCOUNTER — Ambulatory Visit: Payer: BLUE CROSS/BLUE SHIELD

## 2014-01-07 ENCOUNTER — Ambulatory Visit: Payer: BLUE CROSS/BLUE SHIELD | Attending: Orthopaedic Surgery

## 2014-01-07 DIAGNOSIS — M25511 Pain in right shoulder: Secondary | ICD-10-CM

## 2014-01-07 DIAGNOSIS — M4050 Lordosis, unspecified, site unspecified: Secondary | ICD-10-CM | POA: Insufficient documentation

## 2014-01-07 DIAGNOSIS — M5022 Other cervical disc displacement, mid-cervical region: Secondary | ICD-10-CM | POA: Insufficient documentation

## 2014-01-07 DIAGNOSIS — R531 Weakness: Secondary | ICD-10-CM | POA: Insufficient documentation

## 2014-01-07 DIAGNOSIS — M5030 Other cervical disc degeneration, unspecified cervical region: Secondary | ICD-10-CM | POA: Insufficient documentation

## 2014-01-07 DIAGNOSIS — R200 Anesthesia of skin: Secondary | ICD-10-CM | POA: Insufficient documentation

## 2014-01-07 DIAGNOSIS — M542 Cervicalgia: Secondary | ICD-10-CM | POA: Insufficient documentation

## 2014-01-10 ENCOUNTER — Ambulatory Visit: Payer: BLUE CROSS/BLUE SHIELD

## 2014-01-10 ENCOUNTER — Inpatient Hospital Stay
Admission: RE | Admit: 2014-01-10 | Discharge: 2014-01-10 | Disposition: A | Discharge status: Home / Self Care | Payer: BLUE CROSS/BLUE SHIELD | Source: Ambulatory Visit

## 2014-01-10 VITALS — BP 114/76 | Wt 175.2 lb

## 2014-01-10 DIAGNOSIS — Z951 Presence of aortocoronary bypass graft: Secondary | ICD-10-CM

## 2014-01-10 NOTE — Progress Notes (Signed)
[name] arrived in Cardiac Rehab to exercise today.  She had a 4lb weight gains since her last session, states she was out at a social function this weekend and did not monitor her NA intake.  Patient states she has no SOB, chest discomfort, edema or fatigue but is experiencing some upper respiratory congestion and may be getting a cold.  Her ankles and hands show no evidence of edema.  Asked patient to cut back exercise today and monitor her weight daily.  She said she will call MD if she starts to experience further weight gain or symptoms such as fatigue, SOB or chest discomfort.  Exercised for 30 min.  Without decompensation or angina.  Reviewed DHS, [employer] Pyramid and NA restriction.  Patient stated she will email her MD about weight gain.

## 2014-01-12 ENCOUNTER — Inpatient Hospital Stay
Admission: RE | Admit: 2014-01-12 | Discharge: 2014-01-12 | Disposition: A | Discharge status: Home / Self Care | Payer: BLUE CROSS/BLUE SHIELD | Source: Ambulatory Visit

## 2014-01-12 ENCOUNTER — Ambulatory Visit: Payer: BLUE CROSS/BLUE SHIELD

## 2014-01-12 VITALS — BP 126/78 | Ht 65.0 in | Wt 172.6 lb

## 2014-01-12 DIAGNOSIS — Z951 Presence of aortocoronary bypass graft: Secondary | ICD-10-CM

## 2014-01-12 NOTE — Progress Notes (Signed)
30 day review completed.  Patient has not been experiences any symptoms of chest pain since most recent cath.  Encouraged to exercise outside of rehab as long as it is within her settings here.  States she has treadmill and stationary bike.  Will introduce weights next time.  Discussed THR based on her ST as well as cholesterol guidelines.  Verbalizes understanding.  Is going out of town for the holidays, states she will walk while she is away.  Has returned to work, states she is low stress and sleeping better.  All patients questions were answered.

## 2014-01-13 ENCOUNTER — Ambulatory Visit: Payer: BLUE CROSS/BLUE SHIELD

## 2014-01-17 ENCOUNTER — Inpatient Hospital Stay
Admission: RE | Admit: 2014-01-17 | Discharge: 2014-01-17 | Disposition: A | Discharge status: Home / Self Care | Payer: BLUE CROSS/BLUE SHIELD | Source: Ambulatory Visit

## 2014-01-17 ENCOUNTER — Ambulatory Visit: Payer: BLUE CROSS/BLUE SHIELD

## 2014-01-17 VITALS — BP 116/72 | Wt 169.6 lb

## 2014-01-17 DIAGNOSIS — Z951 Presence of aortocoronary bypass graft: Secondary | ICD-10-CM

## 2014-01-19 ENCOUNTER — Inpatient Hospital Stay
Admission: RE | Admit: 2014-01-19 | Discharge: 2014-01-19 | Disposition: A | Discharge status: Home / Self Care | Payer: BLUE CROSS/BLUE SHIELD | Source: Ambulatory Visit

## 2014-01-19 ENCOUNTER — Encounter (INDEPENDENT_AMBULATORY_CARE_PROVIDER_SITE_OTHER): Payer: BLUE CROSS/BLUE SHIELD | Admitting: Cardiovascular Disease

## 2014-01-19 ENCOUNTER — Ambulatory Visit: Payer: BLUE CROSS/BLUE SHIELD

## 2014-01-19 VITALS — BP 98/70 | Wt 171.4 lb

## 2014-01-19 DIAGNOSIS — Z951 Presence of aortocoronary bypass graft: Secondary | ICD-10-CM

## 2014-01-20 ENCOUNTER — Ambulatory Visit: Payer: BLUE CROSS/BLUE SHIELD

## 2014-01-24 ENCOUNTER — Ambulatory Visit: Payer: BLUE CROSS/BLUE SHIELD

## 2014-01-24 DIAGNOSIS — I208 Other forms of angina pectoris: Secondary | ICD-10-CM | POA: Insufficient documentation

## 2014-01-24 DIAGNOSIS — Z951 Presence of aortocoronary bypass graft: Secondary | ICD-10-CM | POA: Insufficient documentation

## 2014-01-26 ENCOUNTER — Inpatient Hospital Stay
Admission: RE | Admit: 2014-01-26 | Discharge: 2014-01-26 | Disposition: A | Discharge status: Home / Self Care | Payer: BLUE CROSS/BLUE SHIELD | Source: Ambulatory Visit | Attending: Cardiovascular Disease | Admitting: Cardiovascular Disease

## 2014-01-26 ENCOUNTER — Ambulatory Visit: Payer: BLUE CROSS/BLUE SHIELD

## 2014-01-26 VITALS — BP 96/68 | Wt 171.8 lb

## 2014-01-26 DIAGNOSIS — Z951 Presence of aortocoronary bypass graft: Secondary | ICD-10-CM

## 2014-01-27 ENCOUNTER — Inpatient Hospital Stay
Admission: RE | Admit: 2014-01-27 | Discharge: 2014-01-27 | Disposition: A | Discharge status: Home / Self Care | Payer: BLUE CROSS/BLUE SHIELD | Source: Ambulatory Visit

## 2014-01-27 ENCOUNTER — Ambulatory Visit: Payer: BLUE CROSS/BLUE SHIELD

## 2014-01-27 VITALS — BP 94/68 | Wt 171.8 lb

## 2014-01-27 DIAGNOSIS — Z951 Presence of aortocoronary bypass graft: Secondary | ICD-10-CM

## 2014-01-28 ENCOUNTER — Ambulatory Visit: Payer: BLUE CROSS/BLUE SHIELD

## 2014-01-31 ENCOUNTER — Ambulatory Visit: Payer: BLUE CROSS/BLUE SHIELD

## 2014-01-31 ENCOUNTER — Inpatient Hospital Stay
Admission: RE | Admit: 2014-01-31 | Discharge: 2014-01-31 | Disposition: A | Discharge status: Home / Self Care | Payer: BLUE CROSS/BLUE SHIELD | Source: Ambulatory Visit

## 2014-01-31 VITALS — BP 114/74 | Wt 171.3 lb

## 2014-01-31 DIAGNOSIS — Z951 Presence of aortocoronary bypass graft: Secondary | ICD-10-CM

## 2014-02-02 ENCOUNTER — Ambulatory Visit: Payer: BLUE CROSS/BLUE SHIELD

## 2014-02-02 ENCOUNTER — Inpatient Hospital Stay
Admission: RE | Admit: 2014-02-02 | Discharge: 2014-02-02 | Disposition: A | Discharge status: Home / Self Care | Payer: BLUE CROSS/BLUE SHIELD | Source: Ambulatory Visit

## 2014-02-02 VITALS — BP 96/60 | Wt 169.2 lb

## 2014-02-02 DIAGNOSIS — Z951 Presence of aortocoronary bypass graft: Secondary | ICD-10-CM

## 2014-02-02 NOTE — Addendum Note (Signed)
Encounter addended by: Ronni Rumble I on: 02/02/2014  1:19 PM<BR>     Documentation filed: Questionnaires

## 2014-02-03 ENCOUNTER — Ambulatory Visit: Payer: BLUE CROSS/BLUE SHIELD

## 2014-02-03 ENCOUNTER — Inpatient Hospital Stay
Admission: RE | Admit: 2014-02-03 | Discharge: 2014-02-03 | Disposition: A | Discharge status: Home / Self Care | Payer: BLUE CROSS/BLUE SHIELD | Source: Ambulatory Visit

## 2014-02-03 VITALS — BP 92/68 | Wt 171.0 lb

## 2014-02-03 DIAGNOSIS — Z951 Presence of aortocoronary bypass graft: Secondary | ICD-10-CM

## 2014-02-04 ENCOUNTER — Ambulatory Visit: Payer: BLUE CROSS/BLUE SHIELD

## 2014-02-07 ENCOUNTER — Ambulatory Visit: Payer: BLUE CROSS/BLUE SHIELD

## 2014-02-09 ENCOUNTER — Inpatient Hospital Stay
Admission: RE | Admit: 2014-02-09 | Discharge: 2014-02-09 | Disposition: A | Discharge status: Home / Self Care | Payer: BLUE CROSS/BLUE SHIELD | Source: Ambulatory Visit

## 2014-02-09 ENCOUNTER — Ambulatory Visit: Payer: BLUE CROSS/BLUE SHIELD

## 2014-02-09 VITALS — BP 110/62 | Wt 173.6 lb

## 2014-02-09 DIAGNOSIS — Z951 Presence of aortocoronary bypass graft: Secondary | ICD-10-CM

## 2014-02-10 ENCOUNTER — Ambulatory Visit: Payer: BLUE CROSS/BLUE SHIELD

## 2014-02-11 ENCOUNTER — Ambulatory Visit: Payer: BLUE CROSS/BLUE SHIELD

## 2014-02-11 ENCOUNTER — Encounter (INDEPENDENT_AMBULATORY_CARE_PROVIDER_SITE_OTHER): Payer: Self-pay

## 2014-02-14 ENCOUNTER — Ambulatory Visit: Payer: BLUE CROSS/BLUE SHIELD

## 2014-02-16 ENCOUNTER — Ambulatory Visit: Payer: BLUE CROSS/BLUE SHIELD

## 2014-02-17 ENCOUNTER — Ambulatory Visit: Payer: BLUE CROSS/BLUE SHIELD

## 2014-02-18 ENCOUNTER — Ambulatory Visit: Payer: BLUE CROSS/BLUE SHIELD

## 2014-02-21 ENCOUNTER — Ambulatory Visit: Payer: BLUE CROSS/BLUE SHIELD

## 2014-02-21 ENCOUNTER — Inpatient Hospital Stay
Admission: RE | Admit: 2014-02-21 | Discharge: 2014-02-21 | Disposition: A | Discharge status: Home / Self Care | Payer: BLUE CROSS/BLUE SHIELD | Source: Ambulatory Visit | Attending: Cardiovascular Disease | Admitting: Cardiovascular Disease

## 2014-02-21 VITALS — BP 108/66 | Wt 175.0 lb

## 2014-02-21 DIAGNOSIS — I208 Other forms of angina pectoris: Secondary | ICD-10-CM | POA: Insufficient documentation

## 2014-02-21 DIAGNOSIS — Z951 Presence of aortocoronary bypass graft: Secondary | ICD-10-CM

## 2014-02-23 ENCOUNTER — Ambulatory Visit
Admission: RE | Admit: 2014-02-23 | Discharge: 2014-02-23 | Disposition: A | Discharge status: Home / Self Care | Payer: BLUE CROSS/BLUE SHIELD | Source: Ambulatory Visit | Attending: Specialist | Admitting: Specialist

## 2014-02-23 ENCOUNTER — Inpatient Hospital Stay
Admission: RE | Admit: 2014-02-23 | Discharge: 2014-02-23 | Disposition: A | Discharge status: Home / Self Care | Payer: BLUE CROSS/BLUE SHIELD | Source: Ambulatory Visit

## 2014-02-23 ENCOUNTER — Ambulatory Visit: Payer: BLUE CROSS/BLUE SHIELD

## 2014-02-23 VITALS — BP 90/66 | Ht 65.0 in | Wt 168.7 lb

## 2014-02-23 DIAGNOSIS — R0789 Other chest pain: Secondary | ICD-10-CM

## 2014-02-23 DIAGNOSIS — Z951 Presence of aortocoronary bypass graft: Secondary | ICD-10-CM

## 2014-02-23 LAB — BUN: BUN: 15 mg/dL (ref 7.0–19.0)

## 2014-02-23 LAB — CREATININE, SERUM: Creatinine: 1.2 mg/dL (ref 0.4–1.5)

## 2014-02-23 LAB — GFR: EGFR: 56.8

## 2014-02-23 LAB — HEMOLYSIS INDEX: Hemolysis Index: 15 (ref 0–18)

## 2014-02-23 NOTE — Addendum Note (Signed)
Encounter addended by: Ronni Rumble I on: 02/23/2014 10:19 AM<BR>     Documentation filed: Follow-up Section

## 2014-02-23 NOTE — Progress Notes (Signed)
60 day ITP today.  Patient states she has been feeling very well and continues to exercise on days she is not coming to rehab.  States she had a phone call with her Cardiologist yesterday at which point her Cardiologist stated she could resume jogging.  With jogging at a low speed (3.8 mph) patient did exceed THR based on GXT done in October.  Patient was asymptomatic.  Fax of strips was sent to Cardiologist with a request for an age based THR.  Patient did stay within her target MET level that was calculated using achieved METs from her stress test.  Is having blood work done today and notes low levels of stress.

## 2014-02-24 ENCOUNTER — Inpatient Hospital Stay
Admission: RE | Admit: 2014-02-24 | Discharge: 2014-02-24 | Disposition: A | Discharge status: Home / Self Care | Payer: BLUE CROSS/BLUE SHIELD | Source: Ambulatory Visit

## 2014-02-24 VITALS — BP 104/74 | Wt 170.0 lb

## 2014-02-24 DIAGNOSIS — Z951 Presence of aortocoronary bypass graft: Secondary | ICD-10-CM

## 2014-02-24 NOTE — Progress Notes (Signed)
Patient arrived in Cardiac Rehab to exercise.  [name] has started jogging at 3. with a 1% grade.  Asked to jog at intervals, did not experience any chest pain or shortness of breath.  Will increase intervals as tolerated and appropiate to THR.  Advised patient to hydrate more since she has prolonged cooldowns.  Denies angina or exercise related symptoms.

## 2014-02-25 ENCOUNTER — Ambulatory Visit: Payer: BLUE CROSS/BLUE SHIELD

## 2014-02-28 ENCOUNTER — Ambulatory Visit: Payer: BLUE CROSS/BLUE SHIELD

## 2014-02-28 ENCOUNTER — Telehealth: Payer: Self-pay

## 2014-03-02 ENCOUNTER — Ambulatory Visit: Payer: BLUE CROSS/BLUE SHIELD

## 2014-03-03 ENCOUNTER — Inpatient Hospital Stay: Admission: RE | Admit: 2014-03-03 | Payer: BLUE CROSS/BLUE SHIELD | Source: Ambulatory Visit

## 2014-03-03 ENCOUNTER — Telehealth: Payer: Self-pay

## 2014-03-03 NOTE — Telephone Encounter (Signed)
Received message from patient this morning state she was not feeling well, last session at [facility]. Pt plans to continue treatment closer to home at [facility]. [facility] staff for care.

## 2014-03-04 ENCOUNTER — Ambulatory Visit: Payer: BLUE CROSS/BLUE SHIELD

## 2014-03-07 ENCOUNTER — Ambulatory Visit: Payer: BLUE CROSS/BLUE SHIELD

## 2014-03-09 ENCOUNTER — Ambulatory Visit: Payer: BLUE CROSS/BLUE SHIELD

## 2014-03-10 ENCOUNTER — Ambulatory Visit: Payer: BLUE CROSS/BLUE SHIELD

## 2014-03-14 ENCOUNTER — Ambulatory Visit: Payer: BLUE CROSS/BLUE SHIELD

## 2014-03-16 ENCOUNTER — Ambulatory Visit: Payer: BLUE CROSS/BLUE SHIELD

## 2014-03-17 ENCOUNTER — Ambulatory Visit: Payer: BLUE CROSS/BLUE SHIELD

## 2014-03-18 ENCOUNTER — Ambulatory Visit (INDEPENDENT_AMBULATORY_CARE_PROVIDER_SITE_OTHER): Payer: BLUE CROSS/BLUE SHIELD | Admitting: Cardiovascular Disease

## 2014-03-21 ENCOUNTER — Ambulatory Visit: Payer: BLUE CROSS/BLUE SHIELD

## 2014-03-23 ENCOUNTER — Ambulatory Visit: Payer: BLUE CROSS/BLUE SHIELD

## 2014-03-24 ENCOUNTER — Ambulatory Visit: Payer: BLUE CROSS/BLUE SHIELD

## 2014-04-20 ENCOUNTER — Encounter (INDEPENDENT_AMBULATORY_CARE_PROVIDER_SITE_OTHER): Payer: Self-pay | Admitting: Neurology

## 2014-04-20 ENCOUNTER — Ambulatory Visit (INDEPENDENT_AMBULATORY_CARE_PROVIDER_SITE_OTHER): Payer: BLUE CROSS/BLUE SHIELD | Admitting: Neurology

## 2014-04-20 VITALS — BP 117/84 | HR 67 | Ht 65.0 in | Wt 170.0 lb

## 2014-04-20 DIAGNOSIS — I6529 Occlusion and stenosis of unspecified carotid artery: Secondary | ICD-10-CM

## 2014-04-20 DIAGNOSIS — R200 Anesthesia of skin: Secondary | ICD-10-CM

## 2014-04-20 MED ORDER — GABAPENTIN 300 MG PO CAPS
300.0000 mg | ORAL_CAPSULE | Freq: Every evening | ORAL | Status: DC
Start: 2014-04-20 — End: 2014-12-19

## 2014-04-20 NOTE — Progress Notes (Signed)
Subjective:   Chief Complaint: Follow up, last seen 12/01/2013    HPI:  [name] is a 54 y.o. female with history of pituitary tumor (prolactinoma), hyperlipidemia, hypertension, and coronary artery bypass graft 2, who follows up for lip numbness x 1 week.     Since her last appointment, she was hospitalized 11/16-11/17/2015 for atypical chest pain. ACS workup was negative, but later found that the mesh on her bypass was causing her discomfort. She just completed cardiac rehab.     Since then, she has a MRI C-spine and MRI shoulder as below. Found to have a right tear of the supraspinatus tendon and supraspinatus tendinopathy. She gets cortisone shots to her shoulder which help.     In regards to her facial numbness, she had one week of numbness in Nov 2015, but that had resolved. She found that the numbness in both lips restarted this past week. It is not present in the mornings when she wakes up, but she noticed that starts when she gets to work in the mornings.  Last throughout the day.  She feels that when she tries to yawn, she will also feel that her eyes will be delayed to open back up like they are stuck. Often feels like she's not completely awake as though she is in a haze.  Of note, prior to the onset of her lip numbness, she had some soreness in her right face.  She also feels that her right eye is constantly tearing.  The changes in her vision.  No associated weakness in her face.  No abnormal movement or twitching noted.  She denies any associated headache.  No changes in her sense of taste or hearing.    She notes that she started crestor in the past two months. Also notes using more mederma on her chest for scarring.     To review her presentation in Nov 2015:  The patient states that she had a cardiac bypass in Sept '15. Prior to that, she had right arm tingling and pain starting in June. She went to an emergency room in June with unremarkable work up. PCP also did a MRI brain which was  negative. Started gabapentin and ibuprofen, which helped. Her right arm symptoms came back in August, then went away after her bypass. MRI brain at that time was also negative. Seen by Dr. Moise Boring in the hospital. Her right arm symptoms came back a few weeks ago, and she states it mainly comes when she is on her back or if she sleeps on her side. No symptoms when she is on her feet and active.   One week ago, the patient states that she developed some numbness in her left upper lip, which has not gone away. She states that her lip feels swollen, but does not look swollen. No associated weakness in her face. No associated symptoms in her left arm. Of note, she states that she had some tenderness in her gums prior to this numbness. She denies any associated blurry or double vision, no dysarthria, no language difficulty, no headache, no nausea, no dizziness, no chest pain, no shortness of breath.   She also describes headaches as penetrating pain in the back of the head which will sometimes radiate to the top of her head. Can feel them coming on and will take ibuprofen or vicodin which help. No headache for the past week or 2. She denies having any symptoms in her legs.   Sister with bell's palsy,  mother with heart disease. Cardiologist is Dr. Sherrie Mustache in [place], [place]. Last saw her recently.       Review of Systems   Constitutional: Negative.    Eyes: Negative for blurred vision and double vision.   Respiratory: Positive for cough.    Cardiovascular: Positive for chest pain.   Gastrointestinal: Negative.    Genitourinary: Negative.    Musculoskeletal: Positive for neck pain.   Skin: Negative.    Neurological: Positive for tingling and sensory change.   Endo/Heme/Allergies: Negative.    Psychiatric/Behavioral: Negative.        The following portions of the patient's history were reviewed and updated as appropriate: allergies, current medications, past family history, past medical history, past social history, past  surgical history and problem list.      No Known Allergies    Current Outpatient Prescriptions   Medication Sig Dispense Refill   . aspirin 325 MG tablet Take 1 tablet (325 mg total) by mouth daily.  0   . Azelastine-Fluticasone 137-50 MCG/ACT Suspension by Nasal route daily as needed.        . budesonide-formoterol (SYMBICORT) 80-4.5 MCG/ACT inhaler Inhale 2 puffs into the lungs 2 (two) times daily as needed.        . fluticasone (VERAMYST) 27.5 MCG/SPRAY nasal spray 2 sprays by Nasal route as needed.        . fluticasone-salmeterol (ADVAIR DISKUS) 250-50 MCG/DOSE Aerosol Powder, Breath Activtivatede Inhale 1 puff into the lungs 2 (two) times daily as needed.     . isosorbide mononitrate (IMDUR) 30 MG 24 hr tablet Take 30 mg by mouth daily.     . metoprolol (LOPRESSOR) 25 MG tablet Take 0.5 tablets (12.5 mg total) by mouth every 12 (twelve) hours. 60 tablet 2   . orlistat (XENICAL) 120 MG capsule Take 120 mg by mouth 3 (three) times daily as needed.        Marland Kitchen PARoxetine (PAXIL) 30 MG tablet Take 30 mg by mouth nightly.        . rosuvastatin (CRESTOR) 40 MG tablet Take 40 mg by mouth daily.     . Vitamin D, Ergocalciferol, (DRISDOL) 50000 UNIT Cap Take 50,000 Units by mouth every 30 (thirty) days.        . cabergoline (DOSTINEX) 0.5 MG tablet Take 0.25 mg by mouth twice a week.     . gabapentin (NEURONTIN) 300 MG capsule Take 1 capsule (300 mg total) by mouth nightly. 30 capsule 2     No current facility-administered medications for this visit.       Patient Active Problem List   Diagnosis   . Chest pain of uncertain etiology   . Elevated BP   . Anxiety   . Elevated coronary artery calcium score   . Right arm pain   . S/P CABG x 2   . Substernal chest pain   . HTN (hypertension)   . Hyperlipidemia   . Coronary artery disease involving autologous vein bypass graft   . Numbness and tingling of right arm   . Numbness of lip   . Chest pain   . CAD (coronary artery disease) of artery bypass graft       History   Substance  Use Topics   . Smoking status: Never Smoker    . Smokeless tobacco: Not on file   . Alcohol Use: No       Family History   Problem Relation Age of Onset   . Heart disease Mother    .  Heart attack Mother    . Stent Mother    . Clotting disorder Neg Hx    . Diabetes Neg Hx        Past Surgical History   Procedure Laterality Date   . Toe surgery Right    . Appendectomy     . Uterine fibroid surgery     . Coronary artery bypass N/A 10/05/2013     Procedure: CORONARY ARTERY BYPASS;  Surgeon: Annie Main, MD;  Location: [facility];  Service: Cardiothoracic;  Laterality: N/A;  LIMA TO LAD   SVG TO OM2       . Endoscopic,vein harvest N/A 10/05/2013     Procedure: ENDOSCOPIC,VEIN HARVEST;  Surgeon: Annie Main, MD;  Location: [facility];  Service: Cardiothoracic;  Laterality: N/A;   . Tee  10/05/2013     Procedure: TEE;  Surgeon: Annie Main, MD;  Location: [facility];  Service: Cardiothoracic;;  probe# [place] probe   . Cardiac catheterization     . Coronary artery bypass graft         Objective:   GENERAL EXAM:  Filed Vitals:    04/20/14 0827   BP: 117/84   Pulse: 67      Physical Exam   Constitutional: She is well-developed, well-nourished, and in no distress.   HENT:   Head: Normocephalic and atraumatic.   Eyes: Conjunctivae and EOM are normal. Pupils are equal, round, and reactive to light.   Vitals reviewed.      NEUROLOGIC EXAM:     Mental Status: The patient is awake, alert. Affect is normal  Fund of knowledge appropriate  Attention span and concentration appear normal.  No dysarthria. Language fluent in conversation.    Cranial nerves:   -CN II: Visual fields full to bedside confrontation   -CN III, IV, VI: Pupils equal, round, and reactive to light; extraocular movements intact; no ptosis   -CN V: Facial sensation decreased periorally, intact elsewhere  -CN VII: Face symmetric  -CN VIII: Hearing intact to conversational speech   -CN IX, X: Palate elevates symmetrically; normal phonation   -CN XI:  Symmetric full strength of sternocleidomastoid and trapezius muscles   -CN XII: Tongue protrudes midline    Motor: Muscle tone normal. Full strength throughout. A few fasciculations seen around right eyelid.     Sensory:   Light touch intact  Decreased PP periorally  Neglect absent    Gait and Romberg are normal. Tandem normal.     ------    LABS:  No recent labs    ------    IMAGES:    MRI c-spine:  FINDINGS:    There is mild straightening of the cervical lordosis which may be due to  patient's positioning or muscle spasm.  The vertebral bodies are normal is height and configuration.     C2-C3: There is no focal disc herniation or bulge. There is no central  spinal canal stenosis or neural foraminal narrowing bilaterally.     C3-C4: There is no focal disc herniation or bulge. There is no central  spinal canal stenosis or neural foraminal narrowing bilaterally.     C4-C5: There is no focal disc herniation or bulge. There is no central  spinal canal stenosis or neural foraminal narrowing bilaterally.     C5-C6: There is shallow central disc protrusion but no significant  compromise of the central canal or neuroforamina.     C6-C7: There is shallow central disc protrusion but no  significant  compromise of the central canal or neuroforamina.     C7-T1: There is no focal disc herniation or bulge. There is no central  spinal canal stenosis or neural foraminal narrowing bilaterally.     The cervical spinal cord demonstrates normal morphology and signal  intensity. The cerebellar tonsils are normal in position. There are no  paraspinal masses or collections.     IMPRESSION:     Straightening of the cervical lordosis. Minimal central  shallow disc protrusion at C5-C6 and C6-C7 levels.     Georgana Curio, MD    01/07/2014 10:07 AM    MRI shoulder (01/07/14):  IMPRESSION:        1. Small full-thickness tear of the supraspinatus tendon and  supraspinatus tendinopathy without evidence of tendon retraction or  muscle belly  atrophy.   2. Partial-thickness undersurface tear of the  infraspinatus tendon and infraspinatus tendinopathy.  3. Bicipital tendinopathy.  4. Moderate degenerative changes of the acromioclavicular joint with  spurring extending inferiorly into the subacromial space and mild  lateral downsloping of the acromial process.     Fonnie Mu, MD    01/07/2014 3:49 PM      Assessment:      1. Facial numbness x 1 week. No numbness since November 2015, but it returned this past week without any clear triggers. CN V (V2-3) distribution. Associated right eye tearing could suggest involvement of the lacrimal gland which is innervated by CN VII via CN V (V1). However, the perioral involvement of her lips could suggest a deeper nuclear localization in the brainstem. She also describes an odd sensation of slow responses in her face, but no visible weakness. No other associated neurological deficits like double vision, vision loss, ptosis, dysarthria, dysphagia, or headache. She has a history of prolactinoma. Last MRI in Sept 2015 was non contrasted. Will repeat imaging to rule out a compressive neuropathy, looking at the cavernous sinuses as well.       2. Intermittent right arm tingling.  Found to have right supraspinatus tear and tendinopathy on MRI shoulder. MRI c-spine showed mild disc protrusions at C5-C5 and C6-C7.      Plan:     1. Left facial numbness  Lyme Ab Total Rflx to Western Blot (IgM)    ESR,CRP    MRI Brain W WO Contrast    ANA PANEL    MRI Face Only W WO Contrast    gabapentin (NEURONTIN) 300 MG capsule QHS    Consider checking pituitary labs if MRI concerning for worsening tumor     - I have advised the patient to seek immediate attention if she develops any different neurologic symptoms which are worrisome to her.   - Continue with current medications, including daily aspirin and statin for vascular risk factors.         Follow up: 3 weeks and as needed.    Annamary Carolin, MD  [facility]  Neurology  [facility]  T [phone] F [phone]

## 2014-04-25 ENCOUNTER — Ambulatory Visit (HOSPITAL_BASED_OUTPATIENT_CLINIC_OR_DEPARTMENT_OTHER)
Admission: RE | Admit: 2014-04-25 | Discharge: 2014-04-25 | Disposition: A | Discharge status: Home / Self Care | Payer: BLUE CROSS/BLUE SHIELD | Source: Ambulatory Visit | Attending: Neurology | Admitting: Neurology

## 2014-04-25 ENCOUNTER — Ambulatory Visit
Admission: RE | Admit: 2014-04-25 | Discharge: 2014-04-25 | Disposition: A | Discharge status: Home / Self Care | Payer: BLUE CROSS/BLUE SHIELD | Source: Ambulatory Visit | Attending: Neurology | Admitting: Neurology

## 2014-04-25 DIAGNOSIS — R200 Anesthesia of skin: Secondary | ICD-10-CM | POA: Insufficient documentation

## 2014-04-25 LAB — POCT CREATININE STAT SENSOR (AH)
Creatinine POCT: 0.84 mg/dL (ref ?–1.2)
GFR POCT: >60 mL/min/{1.73_m2} (ref 60–?)
Reference Range: NORMAL
Reference Range: NORMAL

## 2014-04-25 LAB — C-REACTIVE PROTEIN: C-Reactive Protein: <0.1 mg/dL (ref 0.0–0.8)

## 2014-04-25 LAB — SEDIMENTATION RATE: Sed Rate: 5 mm/Hr (ref 0–20)

## 2014-04-25 MED ORDER — GADOBUTROL 1 MMOL/ML IV SOLN
7.0000 mL | Freq: Once | INTRAVENOUS | Status: AC | PRN
Start: 2014-04-25 — End: 2014-04-25
  Administered 2014-04-25: 7 mmol via INTRAVENOUS

## 2014-04-26 LAB — LYME AB, TOTAL,REFLEX TO WESTERN BLOT (IGG & IGM): Lyme Disease AB Screen: <=0.9 (ref <=0.90)

## 2014-04-26 LAB — ANA PANEL
ANA Qualitative: NEGATIVE
ANA RNP: 68 (ref 0–99)
Anti-DNA (DS) Antibody Quantitative: 57 (ref 0–99)
Centromere Interpretation: NEGATIVE
Centromere: 19 (ref 0–99)
Histone Interpretation: NEGATIVE
Histone: 26 (ref 0–99)
Jo-1 Interpretation: NEGATIVE
Jo-1: 37 (ref 0–99)
Ribonucleoprotein (RNP) Antibody Interpretation: NEGATIVE
SM Interpretation: NEGATIVE
SM: 39 (ref 0–99)
SSA Interpretation: NEGATIVE
Scleroderma (Scl-70) Antibody Interpretation: NEGATIVE
Scleroderma SCL-70: 54 (ref 0–99)
Sjogrens SSA (Ro) Antibody: 14 (ref 0–99)
Sjogrens SSB (La) Antibody Interpretation: NEGATIVE
Sjogrens SSB (La) Antibody: 30 (ref 0–99)
dsDNA Interpretation: NEGATIVE

## 2014-04-27 ENCOUNTER — Ambulatory Visit (INDEPENDENT_AMBULATORY_CARE_PROVIDER_SITE_OTHER): Payer: BLUE CROSS/BLUE SHIELD | Admitting: Cardiovascular Disease

## 2014-04-27 ENCOUNTER — Encounter (INDEPENDENT_AMBULATORY_CARE_PROVIDER_SITE_OTHER): Payer: Self-pay | Admitting: Cardiovascular Disease

## 2014-04-27 ENCOUNTER — Encounter (INDEPENDENT_AMBULATORY_CARE_PROVIDER_SITE_OTHER): Payer: Self-pay

## 2014-04-27 ENCOUNTER — Ambulatory Visit
Admission: RE | Admit: 2014-04-27 | Discharge: 2014-04-27 | Disposition: A | Discharge status: Home / Self Care | Payer: BLUE CROSS/BLUE SHIELD | Source: Ambulatory Visit | Attending: Cardiovascular Disease | Admitting: Cardiovascular Disease

## 2014-04-27 VITALS — BP 117/74 | HR 63 | Wt 173.0 lb

## 2014-04-27 DIAGNOSIS — R202 Paresthesia of skin: Secondary | ICD-10-CM

## 2014-04-27 DIAGNOSIS — Z951 Presence of aortocoronary bypass graft: Secondary | ICD-10-CM

## 2014-04-27 DIAGNOSIS — E7800 Pure hypercholesterolemia, unspecified: Secondary | ICD-10-CM

## 2014-04-27 DIAGNOSIS — R079 Chest pain, unspecified: Secondary | ICD-10-CM

## 2014-04-27 DIAGNOSIS — R200 Anesthesia of skin: Secondary | ICD-10-CM

## 2014-04-27 DIAGNOSIS — I25709 Atherosclerosis of coronary artery bypass graft(s), unspecified, with unspecified angina pectoris: Secondary | ICD-10-CM

## 2014-04-27 DIAGNOSIS — I10 Essential (primary) hypertension: Secondary | ICD-10-CM

## 2014-04-27 DIAGNOSIS — R5383 Other fatigue: Secondary | ICD-10-CM

## 2014-04-27 LAB — BASIC METABOLIC PANEL
BUN: 11 mg/dL (ref 7.0–19.0)
CO2: 28 mEq/L (ref 21–30)
Calcium: 9.4 mg/dL (ref 8.5–10.5)
Chloride: 106 mEq/L (ref 100–111)
Creatinine: 0.9 mg/dL (ref 0.4–1.5)
Glucose: 98 mg/dL (ref 70–100)
Potassium: 4.7 mEq/L (ref 3.5–5.3)
Sodium: 140 mEq/L (ref 135–146)

## 2014-04-27 LAB — MAN DIFF ONLY
Atypical Lymphocytes %: 11 %
Atypical Lymphocytes Absolute: 0.47 10*3/uL — ABNORMAL HIGH
Band Neutrophils Absolute: 0 10*3/uL (ref 0.00–1.00)
Band Neutrophils: 0 %
Eosinophils Absolute Manual: 0.21 10*3/uL (ref 0.00–0.70)
Eosinophils Manual: 5 %
Lymphocytes Absolute Manual: 1.62 10*3/uL (ref 0.50–4.40)
Lymphocytes Manual: 37 %
Monocytes Absolute: 0.43 10*3/uL (ref 0.00–1.20)
Monocytes Manual: 10 %
Neutrophils Absolute Manual: 1.71 10*3/uL — ABNORMAL LOW (ref 1.80–8.10)
Nucleated RBC: 0 /100 WBC (ref 0–1)
Segmented Neutrophils: 39 %

## 2014-04-27 LAB — CBC AND DIFFERENTIAL
Hematocrit: 42 % (ref 37.0–47.0)
Hgb: 13.5 g/dL (ref 12.0–16.0)
MCH: 30.6 pg (ref 28.0–32.0)
MCHC: 32.1 g/dL (ref 32.0–36.0)
MCV: 95.2 fL (ref 80.0–100.0)
MPV: 11.2 fL (ref 9.4–12.3)
Platelets: 233 10*3/uL (ref 140–400)
RBC: 4.41 10*6/uL (ref 4.20–5.40)
RDW: 13 % (ref 12–15)
WBC: 4.43 10*3/uL (ref 3.50–10.80)

## 2014-04-27 LAB — GFR: EGFR: >60

## 2014-04-27 LAB — PT/INR
PT INR: 1 (ref 0.9–1.1)
PT: 13.7 s (ref 12.6–15.0)

## 2014-04-27 LAB — HEMOLYSIS INDEX: Hemolysis Index: 20 — ABNORMAL HIGH (ref 0–18)

## 2014-04-27 LAB — CELL MORPHOLOGY
Cell Morphology: NORMAL
Platelet Estimate: NORMAL

## 2014-04-27 MED ORDER — METOPROLOL TARTRATE 25 MG PO TABS
25.0000 mg | ORAL_TABLET | Freq: Two times a day (BID) | ORAL | Status: AC
Start: 2014-04-27 — End: ?

## 2014-04-27 NOTE — Progress Notes (Signed)
[facility] Cardiology office visit      I had the pleasure of seeing [name] today for cardiovascular follow up. She is a pleasant 54 y.o.  With CAD s/p cABG x2 ( S/p LIMA to LAD, SVG to OM) 09/2013 , b/l carotid artery stenosis, HTN , HPL     She finished cardiac rehab about  a month  Ago . However for the past month she has experienced dyspnea on minimal exertion with significant fatique . She also has constant periroral tingling and numbness  For 1 m with recent MRI 04/25/2014 unremarkable along with intermittent right arm numbness unrelated to exertion( mostly at rest) for 1 m . These symptoms are persistant for 1 m, but not progressively worse        Symptoms are  similar to what  she had prior to CABG , saw Dr Margretta Ditty at [facility] and was scheduled for an OP cath  but because of insurance reasons she decided to come back to [place]       She also c/o constant left precordial chest pressure , since her CABG , waxing and waning, non pleuritic or positional and somewhat better with nitro. Had recent CT scan by Dr Margretta Ditty and was told that her CP was related to sternal mesh issues  From her bypass ( results awaited )       She  denies any   palpitations, lower extremity edema, orthopnea, paroxysmal nocturnal dyspnea, or syncope .      MEDICATIONS: She has a current medication list which includes the following prescription(s): aspirin - Take 1 tablet (325 mg total) by mouth daily, azelastine-fluticasone - by Nasal route daily as needed.   , budesonide-formoterol - Inhale 2 puffs into the lungs 2 (two) times daily as needed.   , cabergoline - Take 0.25 mg by mouth as needed.   , fluticasone - 2 sprays by Nasal route as needed.   , fluticasone-salmeterol - Inhale 1 puff into the lungs 2 (two) times daily as needed, gabapentin - Take 1 capsule (300 mg total) by mouth nightly (Patient taking differently: Take 300 mg by mouth as needed.   ), isosorbide mononitrate - Take 30 mg by mouth daily,  metoprolol - Take 1 tablet (25 mg total) by mouth 2 (two) times daily, orlistat - Take 120 mg by mouth 3 (three) times daily as needed.   , paroxetine - Take 30 mg by mouth nightly.   , rosuvastatin - Take 40 mg by mouth daily, and vitamin d (ergocalciferol) - Take 50,000 Units by mouth every 30 (thirty) days.   Marland Kitchen    REVIEW OF SYSTEMS: All other systems reviewed and negative except as above.    PHYSICAL EXAMINATION  General Appearance: A well-appearing woman in no acute distress.    Vital Signs: BP 117/74 mmHg  Pulse 63  Wt 78.472 kg (173 lb)      HEENT: Sclera anicteric, conjunctiva without pallor, moist mucous membranes,  Neck: Supple without jugular venous distention. . No carotid bruits.  Chest: Good air movement and respiratory effort.  Clear to auscultation bilaterally. No wheezes, rales, or rhonchi  Cardiovascular: Normal S1 and S2 without murmurs, gallops or rub. PMI nondisplaced.   Abdomen: Soft, nontender.  No bruits.    Extremities: Warm .  No  edema.  DP  2+ b/l   Skin: No rash,  Neuro: Alert. Grossly intact.  Psych: Normal mood and affect.         ECG:  Sinus bradycardia@54  /m. LAFB , anterior T wave inversions.    Cath 12/2013:  Distal left main stenosis, 60%, with a small ulcerated plaque, patency of a  left internal mammary artery, which has competitive flow and is underfilled  attaches to the left anterior descending, vein graft patent to the first  obtuse marginal branch with a proximal area of narrowing of 50% to 60%,  good flow noted in the more distal vessel, nondiseased right coronary  artery, normal left ventricular systolic function, ejection fraction 60%.      ECHOCARDIOGRAM: 09/2013  LVEF 55-65%, Mild TR        ASSESSMENT AND PLAN:     Angina:  Fatigue, Dyspnea on exertion, right arm numbness and perioral tingling and numbness for 1 month.  similar to her angina  Prior to bypass.  Cardiac cath post bypass with  competitive flow to the LAD and 50-60% proximal stenosis in the SVG  graft     Discussed benefits and risks of cardiac cath .pt agreeble. Will schedule ASAP. Pt asked to get prompt medical attention  If symptoms worse     CAD s/p cABG x2 ( S/p LIMA to LAD, SVG to OM) 09/2013 s/p cardiac cath 12/2013: patent grafts  But 50-60% stenosis of SVG to OM and competitive flow to the LAD. Mx as above     HPL: tolerating  Statins      All questions regarding cardiovascular diseases were answered during this encounter.  More than 40 min time spent with greater than 50% time on face to face counselling    Orders Placed This Encounter   Procedures   . Prothrombin time/INR   . CBC and differential   . Basic Metabolic Panel     Return in about 2 weeks (around 05/11/2014).        Kirsten Edward, MD

## 2014-04-27 NOTE — Patient Instructions (Signed)
Exam-specific instructions:     Medication and NPO instructions: Pt instructed to be npo after midnight, nothing to eat or drink.  Pt should take meds as directed by Dr. Vidal Schwalbe office.    Clothing instructions: Instructed patient to wear loose, comfortable clothing.      Arrival instructions: Instructed patient to arrive at 0830 via the Patient Entrance.  Instructed patient where the [facility] is located and about parking options.  Her husband will be her designated driver and she is aware that there is a possibility that she could be here overnight.    Other reminders: `Instructed to bring something to keep occupied in case of any unforeseen delays.

## 2014-04-28 ENCOUNTER — Encounter
Admission: RE | Disposition: A | Discharge status: Home / Self Care | Payer: Self-pay | Source: Ambulatory Visit | Attending: Cardiovascular Disease

## 2014-04-28 ENCOUNTER — Ambulatory Visit
Admission: RE | Admit: 2014-04-28 | Discharge: 2014-04-30 | Disposition: A | Discharge status: Home / Self Care | Payer: BLUE CROSS/BLUE SHIELD | Source: Ambulatory Visit | Attending: Cardiovascular Disease | Admitting: Cardiovascular Disease

## 2014-04-28 ENCOUNTER — Ambulatory Visit: Payer: BLUE CROSS/BLUE SHIELD | Admitting: Cardiovascular Disease

## 2014-04-28 DIAGNOSIS — I25119 Atherosclerotic heart disease of native coronary artery with unspecified angina pectoris: Secondary | ICD-10-CM | POA: Insufficient documentation

## 2014-04-28 DIAGNOSIS — Z7982 Long term (current) use of aspirin: Secondary | ICD-10-CM | POA: Insufficient documentation

## 2014-04-28 DIAGNOSIS — I25110 Atherosclerotic heart disease of native coronary artery with unstable angina pectoris: Secondary | ICD-10-CM | POA: Insufficient documentation

## 2014-04-28 DIAGNOSIS — Y832 Surgical operation with anastomosis, bypass or graft as the cause of abnormal reaction of the patient, or of later complication, without mention of misadventure at the time of the procedure: Secondary | ICD-10-CM | POA: Insufficient documentation

## 2014-04-28 DIAGNOSIS — R079 Chest pain, unspecified: Secondary | ICD-10-CM | POA: Insufficient documentation

## 2014-04-28 DIAGNOSIS — Z951 Presence of aortocoronary bypass graft: Secondary | ICD-10-CM | POA: Insufficient documentation

## 2014-04-28 DIAGNOSIS — T82858A Stenosis of vascular prosthetic devices, implants and grafts, initial encounter: Secondary | ICD-10-CM | POA: Insufficient documentation

## 2014-04-28 DIAGNOSIS — I2510 Atherosclerotic heart disease of native coronary artery without angina pectoris: Secondary | ICD-10-CM | POA: Diagnosis present

## 2014-04-28 SURGERY — LEFT HEART CATH POSS PCI
Laterality: Left

## 2014-04-28 MED ORDER — ROSUVASTATIN CALCIUM 20 MG PO TABS
40.0000 mg | ORAL_TABLET | Freq: Every evening | ORAL | Status: DC
Start: 2014-04-28 — End: 2014-04-28

## 2014-04-28 MED ORDER — ACETAMINOPHEN 325 MG PO TABS
650.0000 mg | ORAL_TABLET | ORAL | Status: DC | PRN
Start: 2014-04-28 — End: 2014-04-28

## 2014-04-28 MED ORDER — TICAGRELOR 90 MG PO TABS
180.0000 mg | ORAL_TABLET | Freq: Two times a day (BID) | ORAL | Status: DC
Start: 2014-04-28 — End: 2014-04-28

## 2014-04-28 MED ORDER — METOPROLOL TARTRATE 25 MG PO TABS
25.0000 mg | ORAL_TABLET | Freq: Two times a day (BID) | ORAL | Status: DC
Start: 2014-04-28 — End: 2014-04-28

## 2014-04-28 MED ORDER — TICAGRELOR 90 MG PO TABS
90.0000 mg | ORAL_TABLET | Freq: Two times a day (BID) | ORAL | Status: DC
Start: 2014-04-29 — End: 2014-04-28

## 2014-04-28 MED ORDER — ROSUVASTATIN CALCIUM 10 MG PO TABS
40.0000 mg | ORAL_TABLET | Freq: Every evening | ORAL | Status: DC
Start: 2014-04-28 — End: 2014-04-30
  Administered 2014-04-28 – 2014-04-29 (×2): 40 mg via ORAL
  Filled 2014-04-28 (×2): qty 2

## 2014-04-28 MED ORDER — SODIUM CHLORIDE 0.9 % IV SOLN
INTRAVENOUS | Status: DC
Start: 2014-04-28 — End: 2014-04-28

## 2014-04-28 MED ORDER — ASPIRIN 325 MG PO TABS
325.0000 mg | ORAL_TABLET | Freq: Every day | ORAL | Status: DC
Start: 2014-04-28 — End: 2014-04-28

## 2014-04-28 MED ORDER — METOPROLOL TARTRATE 25 MG PO TABS
25.0000 mg | ORAL_TABLET | Freq: Two times a day (BID) | ORAL | Status: DC
Start: 2014-04-28 — End: 2014-04-30
  Administered 2014-04-29 – 2014-04-30 (×3): 25 mg via ORAL
  Filled 2014-04-28 (×3): qty 1

## 2014-04-28 MED ORDER — FENTANYL CITRATE 0.05 MG/ML IJ SOLN
INTRAMUSCULAR | Status: AC
Start: 2014-04-28 — End: 2014-04-28
  Administered 2014-04-28: 50 ug via INTRAVENOUS
  Filled 2014-04-28: qty 2

## 2014-04-28 MED ORDER — ISOSORBIDE MONONITRATE ER 30 MG PO TB24
30.0000 mg | ORAL_TABLET | Freq: Every day | ORAL | Status: DC
Start: 2014-04-28 — End: 2014-04-30
  Administered 2014-04-28 – 2014-04-30 (×3): 30 mg via ORAL
  Filled 2014-04-28 (×3): qty 1

## 2014-04-28 MED ORDER — FLUTICASONE PROPIONATE 50 MCG/ACT NA SUSP
1.0000 | Freq: Every day | NASAL | Status: DC
Start: 2014-04-28 — End: 2014-04-28

## 2014-04-28 MED ORDER — ACETAMINOPHEN 325 MG PO TABS
650.0000 mg | ORAL_TABLET | Freq: Four times a day (QID) | ORAL | Status: DC | PRN
Start: 2014-04-28 — End: 2014-04-28

## 2014-04-28 MED ORDER — ASPIRIN 325 MG PO TABS
325.0000 mg | ORAL_TABLET | Freq: Every day | ORAL | Status: DC
Start: 2014-04-28 — End: 2014-04-29
  Administered 2014-04-28: 325 mg via ORAL
  Filled 2014-04-28 (×2): qty 1

## 2014-04-28 MED ORDER — TICAGRELOR 90 MG PO TABS
90.0000 mg | ORAL_TABLET | Freq: Once | ORAL | Status: AC
Start: 2014-04-29 — End: 2014-04-29
  Administered 2014-04-29: 90 mg via ORAL
  Filled 2014-04-28: qty 1

## 2014-04-28 MED ORDER — FENTANYL CITRATE 0.05 MG/ML IJ SOLN
INTRAMUSCULAR | Status: AC
Start: 2014-04-28 — End: 2014-04-28
  Administered 2014-04-28: 100 ug via INTRAVENOUS
  Filled 2014-04-28: qty 2

## 2014-04-28 MED ORDER — FLUTICASONE PROPIONATE 50 MCG/ACT NA SUSP
1.0000 | Freq: Every day | NASAL | Status: DC
Start: 2014-04-29 — End: 2014-04-30
  Filled 2014-04-28: qty 16

## 2014-04-28 MED ORDER — FENTANYL CITRATE 0.05 MG/ML IJ SOLN
50.0000 ug | Freq: Once | INTRAMUSCULAR | Status: AC
Start: 2014-04-28 — End: 2014-04-28

## 2014-04-28 MED ORDER — ISOSORBIDE MONONITRATE ER 30 MG PO TB24
30.0000 mg | ORAL_TABLET | Freq: Every day | ORAL | Status: DC
Start: 2014-04-28 — End: 2014-04-28

## 2014-04-28 MED ORDER — TICAGRELOR 90 MG PO TABS
180.0000 mg | ORAL_TABLET | Freq: Once | ORAL | Status: AC
Start: 2014-04-29 — End: 2014-04-29
  Administered 2014-04-29: 180 mg via ORAL
  Filled 2014-04-28: qty 2

## 2014-04-28 MED ORDER — CLONAZEPAM 0.5 MG PO TABS
0.5000 mg | ORAL_TABLET | Freq: Two times a day (BID) | ORAL | Status: DC | PRN
Start: 2014-04-28 — End: 2014-04-28

## 2014-04-28 MED ORDER — HEPARIN 5 UNIT/ML PRIMING SOLUTION 1000 ML (CRRT)
Status: AC
Start: 2014-04-28 — End: 2014-04-28
  Administered 2014-04-28: 2 [IU] via INTRA_ARTERIAL
  Filled 2014-04-28: qty 2000

## 2014-04-28 MED ORDER — ACETAMINOPHEN 325 MG PO TABS
650.0000 mg | ORAL_TABLET | ORAL | Status: DC | PRN
Start: 2014-04-28 — End: 2014-04-30
  Administered 2014-04-28 – 2014-04-29 (×2): 650 mg via ORAL
  Filled 2014-04-28 (×2): qty 2

## 2014-04-28 MED ORDER — FLUTICASONE-SALMETEROL 115-21 MCG/ACT IN AERO
2.0000 | INHALATION_SPRAY | Freq: Two times a day (BID) | RESPIRATORY_TRACT | Status: DC
Start: 2014-04-28 — End: 2014-04-30
  Filled 2014-04-28: qty 8

## 2014-04-28 MED ORDER — SODIUM CHLORIDE 0.9 % IV SOLN
INTRAVENOUS | Status: AC
Start: 2014-04-28 — End: 2014-04-29
  Administered 2014-04-28: 75 mL via INTRAVENOUS

## 2014-04-28 MED ORDER — LIDOCAINE HCL (PF) 1 % IJ SOLN
INTRAMUSCULAR | Status: AC
Start: 2014-04-28 — End: 2014-04-28
  Administered 2014-04-28: 3 mL via SUBCUTANEOUS
  Filled 2014-04-28: qty 30

## 2014-04-28 MED ORDER — IODIXANOL 320 MG/ML IV SOLN
100.0000 mL | Freq: Once | INTRAVENOUS | Status: DC | PRN
Start: 2014-04-28 — End: 2014-04-28

## 2014-04-28 MED ORDER — MIDAZOLAM HCL 2 MG/2ML IJ SOLN
INTRAMUSCULAR | Status: AC
Start: 2014-04-28 — End: 2014-04-28
  Administered 2014-04-28: 1 mg via INTRAVENOUS
  Filled 2014-04-28: qty 2

## 2014-04-28 MED ORDER — ACETAMINOPHEN 325 MG PO TABS
ORAL_TABLET | ORAL | Status: AC
Start: 2014-04-28 — End: 2014-04-28
  Administered 2014-04-28: 650 mg via ORAL
  Filled 2014-04-28: qty 2

## 2014-04-28 MED ORDER — PAROXETINE HCL 20 MG PO TABS
30.0000 mg | ORAL_TABLET | Freq: Every day | ORAL | Status: DC
Start: 2014-04-29 — End: 2014-04-30
  Administered 2014-04-29 – 2014-04-30 (×2): 30 mg via ORAL
  Filled 2014-04-28 (×4): qty 1

## 2014-04-28 MED ORDER — PAROXETINE HCL 20 MG PO TABS
30.0000 mg | ORAL_TABLET | Freq: Every day | ORAL | Status: DC
Start: 2014-04-28 — End: 2014-04-28

## 2014-04-28 MED ORDER — NITROGLYCERIN IN D5W 200-5 MCG/ML-% IV SOLN
INTRAVENOUS | Status: AC
Start: 2014-04-28 — End: 2014-04-28
  Filled 2014-04-28: qty 250

## 2014-04-28 MED ORDER — FLUTICASONE-SALMETEROL 115-21 MCG/ACT IN AERO
2.0000 | INHALATION_SPRAY | Freq: Two times a day (BID) | RESPIRATORY_TRACT | Status: DC
Start: 2014-04-28 — End: 2014-04-28

## 2014-04-28 NOTE — Procedures (Signed)
Left Heart Catheterization      Cardiac Catheterization Report    [name]  54 y.o.  female    PreOpDiagnosis:   1. CAD    PostOp Diagnosis:  1. L main distal 80%  2. Ostial left circ 70%  Occluded LIMA  Patent SVG to OM 3 ostial 40%  LV gram - Nl EF 55%    Procedure:  LHC, cor angio, LV gram    Plan:  Will get CV surgery  Consult , but will plan for Stent to [place] ostial LAD Friday.            Fortino Sic, MD  11:30 AM 04/28/2014

## 2014-04-28 NOTE — Progress Notes (Signed)
Discharge instructions reviewed with patient and written copy provided. Ambulated to bathroom, groin remains intact. Discharged ambulatory to care of husband.

## 2014-04-28 NOTE — Progress Notes (Signed)
As per Dr. Vidal Schwalbe request pt. returned to [facility]. Dr. Glean Hess stated at 1700 we should call patient to return, that he had not wanted her discharged.  Pt. was contacted via phone at that time.    Right groin remains intact with 3/10 pain treated with tylenol. IV restarted and fluids hung. Awaiting bed assignment.

## 2014-04-28 NOTE — Procedures (Signed)
ASA      Indication(s):   ( ) Failed stress test  x Chest pain / Angina  ( ) Abnormal EKG  x Known CAD  ( ) Heart Failure / post Heart Transplant  ( ) Other:    Review of systems performed:   Yes  (x)         Allergies:     No Known Allergies      Most recent labs:    If available in Epic:     Lab Results   Component Value Date    WBC 4.43 04/27/2014    HGB 13.5 04/27/2014    HCT 42.0 04/27/2014    MCV 95.2 04/27/2014    PLT 233 04/27/2014          Recent Labs  Lab 04/27/14  1010   SODIUM 140   POTASSIUM 4.7   CHLORIDE 106   CO2 28   BUN 11.0   CREATININE 0.9   EGFR >60.0   GLUCOSE 98   CALCIUM 9.4       If unavailable in Epic, present in paper chart.    ASA Physical Status:   (  )  ASA 1  Healthy patient    x  ASA 2  Mild systemic illness    (  )  ASA 3  Systemic disease, though not incapacitating    (  )  ASA 4  Severe systemic disease that is a constant threat to life     (  )  ASA 5  Moribund condition, patient unexpected to live >24 hours, irrespective of    procedure    (  )  E       Emergent procedure    Planned sedation:   (  ) No sedation  (x) Moderate sedation  (  ) Deep sedation (with Anesthesiology present)      Conclusion:     This patient has been seen and examined immediately prior to the procedure, and I feel that they are an appropriate candidate for the planned procedure with the planned sedation.    The risks, benefits, and alternatives to the planned procedure and sedation have been explained to the patient or the patient's guardian.     The currently available history & physical has been reviewed, and there are no major changes.     Exceptions only in the case of emergency.         Fortino Sic, MD

## 2014-04-29 ENCOUNTER — Other Ambulatory Visit: Payer: Self-pay

## 2014-04-29 ENCOUNTER — Ambulatory Visit: Admission: RE | Admit: 2014-04-29 | Payer: BLUE CROSS/BLUE SHIELD | Source: Ambulatory Visit | Admitting: Cardiology

## 2014-04-29 ENCOUNTER — Encounter
Admission: RE | Disposition: A | Discharge status: Home / Self Care | Payer: Self-pay | Source: Ambulatory Visit | Attending: Cardiovascular Disease

## 2014-04-29 DIAGNOSIS — I25110 Atherosclerotic heart disease of native coronary artery with unstable angina pectoris: Secondary | ICD-10-CM | POA: Insufficient documentation

## 2014-04-29 SURGERY — LEFT HEART CATH POSS PCI
Laterality: Left

## 2014-04-29 MED ORDER — IODIXANOL 320 MG/ML IV SOLN
80.0000 mL | Freq: Once | INTRAVENOUS | Status: AC | PRN
Start: 2014-04-29 — End: 2014-04-29
  Administered 2014-04-29: 80 mL via INTRA_ARTERIAL

## 2014-04-29 MED ORDER — HEPARIN 5 UNIT/ML PRIMING SOLUTION 1000 ML (CRRT)
Status: AC
Start: 2014-04-29 — End: 2014-04-29
  Filled 2014-04-29: qty 2000

## 2014-04-29 MED ORDER — MIDAZOLAM HCL 2 MG/2ML IJ SOLN
INTRAMUSCULAR | Status: AC
Start: 2014-04-29 — End: 2014-04-29
  Administered 2014-04-29: 2 mg
  Filled 2014-04-29: qty 2

## 2014-04-29 MED ORDER — NITROGLYCERIN IN D5W 200-5 MCG/ML-% IV SOLN
INTRAVENOUS | Status: AC
Start: 2014-04-29 — End: 2014-04-29
  Filled 2014-04-29: qty 10

## 2014-04-29 MED ORDER — MORPHINE SULFATE 2 MG/ML IJ/IV SOLN (WRAP)
2.0000 mg | Status: DC | PRN
Start: 2014-04-29 — End: 2014-04-30
  Administered 2014-04-29: 2 mg via INTRAVENOUS
  Filled 2014-04-29: qty 1

## 2014-04-29 MED ORDER — ASPIRIN 81 MG PO TBEC
81.0000 mg | DELAYED_RELEASE_TABLET | Freq: Every day | ORAL | Status: DC
Start: 2014-04-29 — End: 2014-04-30
  Administered 2014-04-29 – 2014-04-30 (×2): 81 mg via ORAL
  Filled 2014-04-29 (×2): qty 1

## 2014-04-29 MED ORDER — FENTANYL CITRATE 0.05 MG/ML IJ SOLN
INTRAMUSCULAR | Status: AC
Start: 2014-04-29 — End: 2014-04-29
  Administered 2014-04-29: 100 ug
  Filled 2014-04-29: qty 4

## 2014-04-29 MED ORDER — HEPARIN SODIUM (PORCINE) 1000 UNIT/ML IJ SOLN
INTRAMUSCULAR | Status: AC
Start: 2014-04-29 — End: 2014-04-29
  Administered 2014-04-29: 5000 [IU]
  Filled 2014-04-29: qty 10

## 2014-04-29 MED ORDER — SODIUM CHLORIDE 0.9 % IV SOLN
INTRAVENOUS | Status: DC
Start: 2014-04-29 — End: 2014-04-29

## 2014-04-29 MED ORDER — SODIUM CHLORIDE 0.9 % IV SOLN
INTRAVENOUS | Status: AC
Start: 2014-04-29 — End: 2014-04-29

## 2014-04-29 MED ORDER — FENTANYL CITRATE 0.05 MG/ML IJ SOLN
INTRAMUSCULAR | Status: AC
Start: 2014-04-29 — End: 2014-04-29
  Administered 2014-04-29: 100 ug
  Filled 2014-04-29: qty 2

## 2014-04-29 MED ORDER — LIDOCAINE HCL (PF) 1 % IJ SOLN
INTRAMUSCULAR | Status: AC
Start: 2014-04-29 — End: 2014-04-29
  Filled 2014-04-29: qty 30

## 2014-04-29 NOTE — Plan of Care (Signed)
Problem: Pain  Goal: Patient's pain/discomfort is manageable  Outcome: Progressing  Pt was admitted from [facility] at 2100, A+Ox4, with husband at bedside, in no distress, denies CP or SOB, only with moderate pain in her right groin, Dr. Glean Hess called with new orders for Brilinta, incision clean, dry, and intact, pulses palpable.  Kept NPO for her second cardiac cath today. Patient and care giver demonstrates understanding of disease process, treatment plan, medications and consequences of noncompliance. All questions and concerns were addressed.

## 2014-04-29 NOTE — Treatment Plan (Signed)
VTE/PE Risk Screening  Complete Upon Admission and Transfer to Different Level of Care  Completed by nurse: Loleta Rose 04/29/2014 6:35 AM   -----------------------------------------------------------------------------------------------------------  SECTION 1 - Risk Screening     []   Patient currently receiving anticoagulation therapy (Heparin, Lovenox, Coumadin, Pradaxa, Xarelto, Eliquis or Arixtra Only) and received 1 dose within 24 hours of admission STOP HERE   []   VTE/PE prophylaxis currently prescribed elsewhere - STOP HERE   []   Comfort Care - STOP HERE   []   Clinical Trials - STOP HERE    Contraindications: Patients with a history of the following conditions cannot haveSequential compression devices (SCD     []  Any of these conditions present , Call MD for pharmacological prophylaxis or ask MD to document reason for not having both mechanical and pharmacologic VTE prophylaxis   []  Post-op vein ligation   []  Suspected VTE   []  Cellulitis/Dermatitis of the leg   []  Severe ischemic Vascular disease   []  Edema related to Congestive Heart Faliure   []  Gangrene   []  Recent skin graft  -----------------------------------------------------------------------------------------------------------  SECTION 2 - Risk Factors (Check all that apply)    Moderate Risk Factors   []   Heart Failure (current or history of)   []   Respiratory Failure   []   Acute Myocardial Infarction (AMI)   []   Acute Infection   []   Rheumatologic Disorder   []   Elderly age (54 years old)   []   Ongoing hormonal treatment / estrogen use (including Tamoxifen, Raloxifene)   []   Obesity (BMI >/= 30kg/m2)    High Risk Factors   []   Recent (</= 1 month) trauma/surgery    Highest Risk Factors   []   Active Cancer   []   Previous VTE   []   Reduced mobility (>24 hrs; current or anticipated)   []   Known thrombophilic condition (hematological disorders that promote thrombosis)      []   No boxes checked in this section indicate patient is at low risk for  VTE. No VTE Prophylaxis indicated.       [x]   One or more risk factors present, enter an EPIC order for Sequential compression devices (SCD). Use per protocol, MD signature required.

## 2014-04-29 NOTE — Progress Notes (Signed)
ASA      Indication(s):   ( ) Failed stress test  ( ) Chest pain / Angina  ( ) Abnormal EKG  x Known CAD  ( ) Heart Failure / post Heart Transplant  ( ) Other:    Review of systems performed:   Yes  (x)         Allergies:     No Known Allergies      Most recent labs:    If available in Epic:     Lab Results   Component Value Date    WBC 4.43 04/27/2014    HGB 13.5 04/27/2014    HCT 42.0 04/27/2014    MCV 95.2 04/27/2014    PLT 233 04/27/2014          Recent Labs  Lab 04/27/14  1010   SODIUM 140   POTASSIUM 4.7   CHLORIDE 106   CO2 28   BUN 11.0   CREATININE 0.9   EGFR >60.0   GLUCOSE 98   CALCIUM 9.4       If unavailable in Epic, present in paper chart.    ASA Physical Status:   (  )  ASA 1  Healthy patient    x ASA 2  Mild systemic illness    (  )  ASA 3  Systemic disease, though not incapacitating    (  )  ASA 4  Severe systemic disease that is a constant threat to life     (  )  ASA 5  Moribund condition, patient unexpected to live >24 hours, irrespective of    procedure    (  )  E       Emergent procedure    Planned sedation:   (  ) No sedation  (x) Moderate sedation  (  ) Deep sedation (with Anesthesiology present)      Conclusion:     This patient has been seen and examined immediately prior to the procedure, and I feel that they are an appropriate candidate for the planned procedure with the planned sedation.    The risks, benefits, and alternatives to the planned procedure and sedation have been explained to the patient or the patient's guardian.     The currently available history & physical has been reviewed, and there are no major changes.     Exceptions only in the case of emergency.         Fortino Sic, MD

## 2014-04-29 NOTE — Progress Notes (Signed)
Extensive discussion with the patient and Dr Tamsen Meek regarding options. In view of atretic LIMA and ischemia by hx from the LAD distribution , recent CABG , will plan for stent to [place] and prox LAD. Risks benefits discussed with the patient and she understands.

## 2014-04-29 NOTE — Plan of Care (Addendum)
Problem: Hemodynamic Status: Cardiac  Goal: Stable vital signs and fluid balance  Intervention: Assess incision for bleeding/hematoma.  The patient and care giver's learning abilities have been assessed. Today's individualized plan of care to monitor incision for signs of bleeding and pain was discussed with the patient and care giver and agree to it. Patient and care giver demonstrates understanding of disease process, treatment plan, medications and consequences of noncompliance. All questions and concerns were addressed.       Patient reported minimal bleeding about 1-2 hours s/p procedure and pain on the site. Applied additional pressure and administered PRN pain medication.  No noted bleeding on the site the rest of the shift. Stated relief from pain.     Pt had 10 beats of v-tach, pt asymptomatic. Attempted to notify MD. Reported to next shift.

## 2014-04-29 NOTE — Procedures (Addendum)
[facility] PRELIM CATH/PCI NOTE    Date Time: 04/29/2014 12:18 PM    Patient Name:   [name]    Date of Cardiac Cath:   04/29/2014    Providers Performing:   Rodman Key, MD - surgeon    Operative Procedure:   CORS    Preoperative Diagnosis:   coronary artery disease    Postoperative Diagnosis:     coronary artery stent      Anesthesia:     Fentanyl 100 mcg      Stents   * No implants in log *      Findings:       LMCA distal70% after 4.0x15 Resolute stent reduced to 30% with eccentric plaque at circ site.    LAD   Proximal 60% after stent reduced to 30%                Mid                   Distal                         D1                             D2                           CIRC  Proximal60% unchanged                Mid                   Distal                         OM1                         OM2      Complications:    Closure- perclose, mild bleeding seen  Complication -mild groin bleeding seen    Plan:   Continue asa, brilinta, medical Rx and f/u in the office.    Signed by: Fortino Sic, MD [facility]                                                                             [facility]      [facility]  [phone]

## 2014-04-30 DIAGNOSIS — I25119 Atherosclerotic heart disease of native coronary artery with unspecified angina pectoris: Secondary | ICD-10-CM

## 2014-04-30 DIAGNOSIS — R079 Chest pain, unspecified: Secondary | ICD-10-CM

## 2014-04-30 LAB — ECG 12-LEAD
Atrial Rate: 54 {beats}/min
P Axis: 77 degrees
P-R Interval: 186 ms
Q-T Interval: 470 ms
QRS Duration: 88 ms
QTC Calculation (Bezet): 445 ms
R Axis: -48 degrees
T Axis: 72 degrees
Ventricular Rate: 54 {beats}/min

## 2014-04-30 LAB — HEMOLYSIS INDEX: Hemolysis Index: 2 (ref 0–18)

## 2014-04-30 LAB — MAGNESIUM: Magnesium: 2.1 mg/dL (ref 1.6–2.6)

## 2014-04-30 MED ORDER — TICAGRELOR 90 MG PO TABS
90.0000 mg | ORAL_TABLET | Freq: Once | ORAL | Status: AC
Start: 2014-04-30 — End: 2014-04-30

## 2014-04-30 MED ORDER — ASPIRIN EC 81 MG PO TBEC
81.0000 mg | DELAYED_RELEASE_TABLET | Freq: Every day | ORAL | Status: DC
Start: 2014-04-30 — End: 2014-12-19

## 2014-04-30 NOTE — Progress Notes (Signed)
Reviewed AVS with Charna Elizabeth, all questions addressed.  Pt stated that all belongings were in her possession.  IV access and telemetry discontinued.  Pt declined wheelchair ride to patient entrance and left via private vehicle.    VSSBP 127/73 mmHg  Pulse 60  Temp(Src) 97.9 F (36.6 C) (Oral)  Resp 16  Ht 1.651 m (5\' 5" )  Wt 71.804 kg (158 lb 4.8 oz)  BMI 26.34 kg/m2  SpO2 99%

## 2014-04-30 NOTE — Plan of Care (Signed)
Problem: Safety  Goal: Patient will be free from injury during hospitalization  Outcome: Progressing  Pt free from injury, fall precautions reinforced.  Pt demonstrates understanding and calls for assistance as needed.  Pain, toileting, possessions, and comfort needs addressed during hourly rounding.    Problem: Pain  Goal: Patient's pain/discomfort is manageable  Outcome: Progressing  No c/o chest pain     Comments:   [name] learning abilities have been assessed. Today's individualized plan of care to continue hourly rounding, pain management, monitor cath site, and prepare for possible discharge was discussed with [name] and she agreed to it. [name] demonstrates understanding of disease process, treatment plan, medications and consequences of noncompliance. All questions and concerns were addressed.

## 2014-04-30 NOTE — Discharge Summary (Signed)
Discharge Summary    Date:04/30/2014   Patient Name: [name]  Attending Physician: Fortino Sic, MD    Date of Admission:   04/28/2014    Date of Discharge:   04/30/14    Admitting Diagnosis:   angina    Discharge Dx:     Principal Diagnosis (Diagnosis after study, that is chiefly responsible for admission to inpatient status): angina       Treatment Team:   Treatment Team:   Attending Provider: Fortino Sic, MD  Consulting Physician: Annie Main, MD     Procedures performed:   Radiology: all results from this admission  Mri Brain W Wo Contrast    04/25/2014     1. MRI brain: Stable exam. No significant abnormality. No abnormal cranial nerve enhancement.  2. MRI facial tissues and orbits:No significant abnormality. No mass lesion.  [name], MD  04/25/2014 3:23 PM     Mri Face Only W Wo Contrast    04/25/2014     1. MRI brain: Stable exam. No significant abnormality. No abnormal cranial nerve enhancement.  2. MRI facial tissues and orbits:No significant abnormality. No mass lesion.  [name], MD  04/25/2014 3:23 PM     Cardiac cath     Reason for Admission:   angina   Hospital Course:     54 year old female with CAD s/p CABG admitted for elective cardiac cath for recurrent angina  Cath (04/28/14):    CORONARY ANGIOGRAPHY:  Left coronary angiography demonstrates a left main. The left main is  moderately diseased to about 70%. There is a more eccentric plaque in the  circumflex marginal branch. The native circumflex has no high-grade  lesions. The vein graft to the marginal branch is patent with some  proximal stenosis of 40% to 50%. The LIMA to the LAD is occluded. The  LIMA is occluded in the mid portion.    On 04/29/14 DES to left main    Condition at Discharge:   stable   Today:     BP 127/73 mmHg  Pulse 60  Temp(Src) 97.9 F (36.6 C) (Oral)  Resp 16  Ht 1.651 m (5\' 5" )  Wt 71.804 kg (158 lb 4.8 oz)  BMI 26.34 kg/m2  SpO2 99%  Ranges for the last 24 hours:  Temp:  [96.4 F (35.8 C)-98.4  F (36.9 C)] 97.9 F (36.6 C)  Heart Rate:  [53-65] 60  Resp Rate:  [16-18] 16  BP: (113-151)/(71-89) 127/73 mmHg    Last set of labs     Recent Labs  Lab 04/27/14  1010   SODIUM 140   POTASSIUM 4.7   CHLORIDE 106   CO2 28   BUN 11.0   CREATININE 0.9   EGFR >60.0   GLUCOSE 98   CALCIUM 9.4       Micro / Labs / Path pending:     Unresulted Labs     None          Discharge Instructions For Providers     1. Follow up with cardiology / Glean Hess / Betti Cruz in 1 week          Discharge Instructions:     Follow-up Information     Follow up with [facility]. Call in 1 week.    Why:  after follow up visit with Cardiologist    Contact information:    [phone]        Follow  up with Christophe Louis, DO .    Specialty:  Internal Medicine    Contact information:    [address]  [place] [address]  [phone]            Discharge Diet: Cardiac Diet          Disposition:  Home or Self Care     Discharge Medication List      Taking          aspirin 81 MG EC tablet   Dose:  81 mg   Take 1 tablet (81 mg total) by mouth daily.       Azelastine-Fluticasone 137-50 MCG/ACT Susp   - by Nasal route daily as needed.     -        budesonide-formoterol 80-4.5 MCG/ACT inhaler   Dose:  2 puff   Commonly known as:  SYMBICORT   - Inhale 2 puffs into the lungs 2 (two) times daily as needed.     -        cabergoline 0.5 MG tablet   Dose:  0.25 mg   Commonly known as:  DOSTINEX   - Take 0.25 mg by mouth as needed.     -        fluticasone 27.5 MCG/SPRAY nasal spray   Dose:  2 spray   Commonly known as:  VERAMYST   - 2 sprays by Nasal route as needed.     -        fluticasone-salmeterol 250-50 MCG/DOSE Aepb   Dose:  1 puff   Commonly known as:  ADVAIR DISKUS   Inhale 1 puff into the lungs 2 (two) times daily as needed.       gabapentin 300 MG capsule   Dose:  300 mg   What changed:    - when to take this  - reasons to take this   Commonly known as:  NEURONTIN   Take 1 capsule (300 mg total)  by mouth nightly.       isosorbide mononitrate 30 MG 24 hr tablet   Dose:  30 mg   Commonly known as:  IMDUR   Take 30 mg by mouth daily.       metoprolol 25 MG tablet   Dose:  25 mg   Commonly known as:  LOPRESSOR   Take 1 tablet (25 mg total) by mouth 2 (two) times daily.       orlistat 120 MG capsule   Dose:  120 mg   Commonly known as:  XENICAL   - Take 120 mg by mouth 3 (three) times daily as needed.     -        PARoxetine 30 MG tablet   Dose:  30 mg   Commonly known as:  PAXIL   - Take 30 mg by mouth nightly.     -        rosuvastatin 40 MG tablet   Dose:  40 mg   Commonly known as:  CRESTOR   Take 40 mg by mouth daily.       ticagrelor 90 MG Tabs   Dose:  90 mg   Commonly known as:  BRILINTA   Take 1 tablet (90 mg total) by mouth once.       Vitamin D (Ergocalciferol) 50000 UNIT Caps   Dose:  50000 Units   Commonly known as:  DRISDOL   - Take 50,000 Units by mouth every 30 (thirty)  days.     -          STOP taking these medications          aspirin 325 MG tablet   Replaced by:  aspirin 81 MG EC tablet           Minutes spent coordinating discharge and reviewing discharge plan: 40 min minutes      Signed by: Darnelle Maffucci, MD

## 2014-04-30 NOTE — Plan of Care (Addendum)
Problem: Hemodynamic Status: Cardiac  Goal: Stable vital signs and fluid balance  Outcome: Progressing  VSS, incision site had minimal drainage overnight, no complaints of pain, no hematoma present, strong pulses present, pt had uneventful night, but had 10 beats of V-tach yesterday during the day.  Dr. Glean Hess paged, Dr. Ezzard Standing covering and notified about V-tach, ordered labs.  Magnesium done and normal.  Patient and care giver demonstrates understanding of disease process, treatment plan, medications and consequences of noncompliance. All questions and concerns were addressed.

## 2014-04-30 NOTE — Discharge Instructions (Signed)
Date of Admission:   04/28/2014    Reason for Admission:   Acute chest pain [R07.9]    Follow up:        Neurology (If Stroke): ______________________  Phone:______________________    Other:___________________________________   Phone: ______________________       Medications:  . Your medications have been listed for you on the Medication Reconciliation Discharge Home List. Please bring a copy of all discharge instructions, including your Medication Reconciliation Discharge Home List when you visit your physician.    . Continue taking all medications even if you feel well, unless otherwise instructed by physician.  . Do not take any over-the-counter medications or herbal supplements without checking with your pharmacist or doctor.       If you are taking Warfarin, please follow up with (health professional/clinic) ____________ on _____________to have your PT/INR blood test checked. ****    Activity:  . Rise slowly from a sitting or lying position. Increase activity slowly, unless otherwise instructed by physician.  Marland Kitchen Perform exercises as desginated by Therapist and Physician.  . In the event of severe shortness of breath or chest discomfort, call 911. Do NOT drive to the hospital.  . Speak with your physician regarding specific driving and/or work restrictions.    Diet:  . If you have special diet orders, you have been given printed diet instructions.   ________________________________________________________________________    Tobacco Cessation Counseling:  If you are currently a tobacco user or have used tobacco within the last 12 months, we have provided you with written Tobacco Cessation Counseling.  ________________________________________________________________________    Heart Failure Education:  If you have a diagnosis of a Heart Failure, we have provided you with written Heart Failure Education.   . Weigh yourself once a day at the same time. Record and bring the weight record to your next physician  appointment.   . Call your doctor if you gain more than 3 pounds in one day or 5 pounds in one week, or if you experience shortness of breath, leg swelling and/or chest discomfort.   . Enroll in the [employer]-Assurance Program, a heart failure patient support program. Call [phone] for additional details.   ________________________________________________________________________    Diamond Nickel Education:  . Call 911 for:  Marland Kitchen Sudden numbness or weakness of the face  . Sudden numbness of the arm or leg especially one side of the body  . Sudden confusion, trouble speaking or understanding  . Sudden trouble seeing in one or both eyes  . Sudden trouble walking or dizziness, loss of balance or coordination      . For promotion of your health, we have provided you with personalized written education on risk factors specific to your diagnosis, including but not limited to:  Marland Kitchen High Blood Pressure  . High Cholesterol  . Atrial Fibrillation  . Overweight  . Diabetes  . Smoking         Vaccinations  Pneumonia Vaccine Received on:  Flu Vaccine Received on:             Treatments/Special Instructions:     Ticagrelor Oral tablet  What is this medicine?  TICAGRELOR (TYE ka GREL or) helps to prevent blood clots. This medicine is used to prevent heart attack, stroke, or other vascular events in people who have had a recent heart attack or who have severe chest pain.  This medicine may be used for other purposes; ask your health care provider or pharmacist if you have questions.  What  should I tell my health care provider before I take this medicine?  They need to know if you have any of these conditions:   bleeding disorder   bleeding in the brain   liver disease   planned surgery   stomach or intestinal ulcers   stroke or transient ischemic attack   an unusual or allergic reaction to ticagrelor, other medicines, foods, dyes, or preservatives   pregnant or trying to get pregnant   breast-feeding  How should I use this  medicine?  Take this medicine by mouth with a glass of water. Follow the directions on the prescription label. You can take it with or without food. If it upsets your stomach, take it with food. Take your medicine at regular intervals. Do not take it more often than directed. Do not stop taking except on your doctor's advice.  Talk to you pediatrician regarding the use of this medicine in children. Special care may be needed.  Overdosage: If you think you've taken too much of this medicine contact a poison control center or emergency room at once.  NOTE: This medicine is only for you. Do not share this medicine with others.  What if I miss a dose?  If you miss a dose, take it as soon as you can. If it is almost time for your next dose, take only that dose. Do not take double or extra doses.  What may interact with this medicine?   certain antibiotics like clarithromycin and telithromycin   certain medicines for fungal infections like itraconazole, ketoconazole, and voriconazole   certain medicines for HIV infection like atazanavir, indinavir, nelfinavir, ritonavir, and saquinavir   certain medicines for seizures like carbamazepine, phenobarbital, and phenytoin   certain medicines that treat or prevent blood clots like warfarin   dexamethasone   digoxin   lovastatin   nefazodone   rifampin   simvastatin  This list may not describe all possible interactions. Give your health care provider a list of all the medicines, herbs, non-prescription drugs, or dietary supplements you use. Also tell them if you smoke, drink alcohol, or use illegal drugs. Some items may interact with your medicine.  What should I watch for while using this medicine?  Visit your doctor or health care professional for regular check ups. Do not stop taking you medicine unless your doctor tells you to.  Notify your doctor or health care professional and seek emergency treatment if you develop breathing problems; changes in vision; chest  pain; severe, sudden headache; pain, swelling, warmth in the leg; trouble speaking; sudden numbness or weakness of the face, arm, or leg. These can be signs that your condition has gotten worse.  If you are going to have surgery or dental work, tell your doctor or health care professional that you are taking this medicine.  You should take aspirin every day with this medicine. Do not take more than 100 mg each day. Talk to your doctor if you have questions.  What side effects may I notice from receiving this medicine?  Side effects that you should report to your doctor or health care professional as soon as possible:   allergic reactions like skin rash, itching or hives, swelling of the face, lips, or tongue   breathing problems   fast or irregular heartbeat   feeling faint or light-headed, falls   signs and symptoms of bleeding such as bloody or black, tarry stools; red or dark-brown urine; spitting up blood or brown material that looks like  coffee grounds; red spots on the skin; unusual bruising or bleeding from the eye, gums, or nose  Side effects that usually do not require medical attention (Report these to your doctor or health care professional if they continue or are bothersome.):   breast enlargement in both males and females   diarrhea   dizziness   headache   tiredness   upset stomach  This list may not describe all possible side effects. Call your doctor for medical advice about side effects. You may report side effects to FDA at 1-800-FDA-1088.  Where should I keep my medicine?  Keep out of the reach of children.  Store at room temperature of 59 to 86 degrees F (15 to 30 degrees C). Throw away any unused medicine after the expiration date.  NOTE: This sheet is a summary. It may not cover all possible information. If you have questions about this medicine, talk to your doctor, pharmacist, or health care provider.  NOTE:This sheet is a summary. It may not cover all possible information. If you  have questions about this medicine, talk to your doctor, pharmacist, or health care provider. Copyright [address]          [address]  [place], [place] [address]  (phone) [phone]   (fax) [phone]        CARDIAC CATHETERIZATION DISCHARGE INSTRUCTIONS    Physician name/number       Your physician performed a cardiac catheterization on you today.  Please follow these instructions and call the number above for any problems encountered after discharge.    ACTIVITY:  ? Limit your activity to staying in bed or recliner, etc.    ? Limit trips of walking up or down stairs.  ? You may walk as needed to the bathroom  ? Avoid strenuous activity (bending, bicycling, jogging, heavy lifting)  ? No driving and no signing of a legal document for 24 hours    DIET:  ? Resume your routine diet.   ? Drink plenty of fluids  ? Avoid alcohol for at least 24 hours.    MEDICATIONS:  ? Resume your routine medications  ? Do not take metformin for 48 hours after procedure.     NEW MEDICATIONS AND INSTRUCTIONS:  ?     ?       ?      POSSIBLE COMPLICATIONS:  ? External bleeding or a lump forming at the puncture site, numbness or extreme pain in leg or foot associated with procedure, chest pain or shortness of breath  ? Apply firm pressure to puncture site for bleeding or lump formation (hematoma)  ? CALL 911 FOR THE ABOVE AND FOR ACUTE CHEST PAIN OR SHORTNESS OF BREATH  ? Notify your physician for any other non-acute problems    DRESSING:  ? You may remove dressing with shower in the morning.  If site remains slightly open, cover with a band-aid  ? If a special closure device was used, follow instructions given    FOLLOW-UP INSTRUCTIONS:  Date Time: 04/30/2014 10:08 AM  Attending Physician: Fortino Sic, MD     Signed by: Zoe Lan, RN    I HAVE RECEIVED AND UNDERSTAND THESE DISCHARGE INSTRUCTIONS.

## 2014-05-02 ENCOUNTER — Encounter (INDEPENDENT_AMBULATORY_CARE_PROVIDER_SITE_OTHER): Payer: Self-pay | Admitting: Internal Medicine

## 2014-05-02 ENCOUNTER — Encounter (INDEPENDENT_AMBULATORY_CARE_PROVIDER_SITE_OTHER): Payer: Self-pay | Admitting: Cardiovascular Disease

## 2014-05-03 ENCOUNTER — Encounter (INDEPENDENT_AMBULATORY_CARE_PROVIDER_SITE_OTHER): Payer: Self-pay | Admitting: Neurology

## 2014-05-04 ENCOUNTER — Ambulatory Visit (INDEPENDENT_AMBULATORY_CARE_PROVIDER_SITE_OTHER): Payer: BLUE CROSS/BLUE SHIELD | Admitting: Neurology

## 2014-05-04 ENCOUNTER — Encounter (INDEPENDENT_AMBULATORY_CARE_PROVIDER_SITE_OTHER): Payer: Self-pay | Admitting: Neurology

## 2014-05-04 DIAGNOSIS — R200 Anesthesia of skin: Secondary | ICD-10-CM

## 2014-05-04 NOTE — Progress Notes (Signed)
Subjective:   Chief Complaint: Follow up, last seen 3/30    HPI:  [name] is a 54 y.o. female with history of pituitary tumor (prolactinoma), hyperlipidemia, hypertension, and coronary artery bypass graft 2, who follows up for lip numbness.     Since her last visit, she was hospitalized for recurrent angina. She had a cath on 4/7 which showed that her left main was moderately diseased to about 70%. She had a DES to the left main on 4/8. Aspirin decreased to 81 mg daily. Started on brilinta.     Blurry sensation has dissipated after the stent. Continues to have chest discomfort.    She had a MRI of her brain and face which did not show any acute abnormalities. No abnormal cranial nerve enhancement. Orbit and face tissue normal. No masses seen. No new pituitary tumor seen.     She also completed blood work including lyme, ANA, ESR, and CRP, which were all normal or negative.     Today she states that her lip tingling is less noticeable. No further tearing.    Never tried gabapentin due to everything going on with her heart.     No new neurological symptoms.     --    To review her presentation in Nov 2015:  The patient states that she had a cardiac bypass in Sept '15. Prior to that, she had right arm tingling and pain starting in June. She went to an emergency room in June with unremarkable work up. PCP also did a MRI brain which was negative. Started gabapentin and ibuprofen, which helped. Her right arm symptoms came back in August, then went away after her bypass. MRI brain at that time was also negative. Seen by Dr. Moise Boring in the hospital. Her right arm symptoms came back a few weeks ago, and she states it mainly comes when she is on her back or if she sleeps on her side. No symptoms when she is on her feet and active.    One week ago, the patient states that she developed some numbness in her left upper lip, which has not gone away. She states that her lip feels swollen, but does not look swollen. No  associated weakness in her face. No associated symptoms in her left arm. Of note, she states that she had some tenderness in her gums prior to this numbness. She denies any associated blurry or double vision, no dysarthria, no language difficulty, no headache, no nausea, no dizziness, no chest pain, no shortness of breath.    She also describes headaches as penetrating pain in the back of the head which will sometimes radiate to the top of her head. Can feel them coming on and will take ibuprofen or vicodin which help. No headache for the past week or two. She denies having any symptoms in her legs.    Sister with bell's palsy, mother with heart disease. Cardiologist is Dr. Sherrie Mustache in [place], [place]. Last saw her recently.     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 She gets cortisone shots to her shoulder which help.   In regards to her facial numbness, she had one week of numbness in Nov 2015, but that had resolved. She found that the numbness in both lips restarted this past week. It is not present in the mornings when she wakes up, but she noticed that starts when she  gets to work in the mornings.  Last throughout the day.  She feels that when she tries to yawn, she will also feel that her eyes will be delayed to open back up like they are stuck. Often feels like she's not completely awake as though she is in a haze.  Of note, prior to the onset of her lip numbness, she had some soreness in her right face.  She also feels that her right eye is constantly tearing.  The changes in her vision.  No associated weakness in her face.  No abnormal movement or twitching noted.  She denies any associated headache.  No changes in her sense of taste or hearing.        Review of Systems   Constitutional: Negative.    HENT: Negative.    Eyes: Negative.    Cardiovascular: Positive for chest pain.   Gastrointestinal:  Negative.    Genitourinary: Negative.    Musculoskeletal: Positive for joint pain.   Skin: Negative.    Neurological: Positive for tingling.   Endo/Heme/Allergies: Negative.    Psychiatric/Behavioral: Negative.        The following portions of the patient's history were reviewed and updated as appropriate: allergies, current medications, past family history, past medical history, past social history, past surgical history and problem list.      No Known Allergies    Current Outpatient Prescriptions   Medication Sig Dispense Refill   . aspirin EC 81 MG EC tablet Take 1 tablet (81 mg total) by mouth daily. 30 tablet 11   . Azelastine-Fluticasone 137-50 MCG/ACT Suspension by Nasal route daily as needed.        . budesonide-formoterol (SYMBICORT) 80-4.5 MCG/ACT inhaler Inhale 2 puffs into the lungs 2 (two) times daily as needed.        . cabergoline (DOSTINEX) 0.5 MG tablet Take 0.25 mg by mouth as needed.        . fluticasone (VERAMYST) 27.5 MCG/SPRAY nasal spray 2 sprays by Nasal route as needed.        . fluticasone-salmeterol (ADVAIR DISKUS) 250-50 MCG/DOSE Aerosol Powder, Breath Activtivatede Inhale 1 puff into the lungs 2 (two) times daily as needed.     . gabapentin (NEURONTIN) 300 MG capsule Take 1 capsule (300 mg total) by mouth nightly. (Patient taking differently: Take 300 mg by mouth as needed.   ) 30 capsule 2   . isosorbide mononitrate (IMDUR) 30 MG 24 hr tablet Take 30 mg by mouth daily.     . metoprolol (LOPRESSOR) 25 MG tablet Take 1 tablet (25 mg total) by mouth 2 (two) times daily. 60 tablet 2   . orlistat (XENICAL) 120 MG capsule Take 120 mg by mouth 3 (three) times daily as needed.        Marland Kitchen PARoxetine (PAXIL) 30 MG tablet Take 30 mg by mouth nightly.        . rosuvastatin (CRESTOR) 40 MG tablet Take 40 mg by mouth daily.     . Vitamin D, Ergocalciferol, (DRISDOL) 50000 UNIT Cap Take 50,000 Units by mouth every 30 (thirty) days.          No current facility-administered medications for this visit.          Objective:   GENERAL EXAM:  Filed Vitals:    05/04/14 0949   BP: 112/73   Pulse: 55      Physical Exam   Constitutional: She is well-developed, well-nourished, and in no distress.   HENT:   Head:  Normocephalic and atraumatic.   Eyes: Conjunctivae and EOM are normal. Pupils are equal, round, and reactive to light.   Vitals reviewed.      NEUROLOGIC EXAM:     Mental Status: The patient is awake, alert. Affect is normal  Fund of knowledge appropriate  Attention span and concentration appear normal.  No dysarthria. Language fluent in conversation.    Cranial nerves:   -CN II: Visual fields full to bedside confrontation   -CN III, IV, VI: Pupils equal, round, and reactive to light; extraocular movements intact; no ptosis   -CN V: Facial sensation decreased periorally, intact elsewhere  -CN VII: Face symmetric  -CN VIII: Hearing intact to conversational speech     Motor: Muscle tone normal. Full strength throughout.    Sensory:   Light touch intact  Mildly decreased PP periorally  Neglect absent    Gait and Romberg are normal. Tandem normal.     ------    LABS:  Lyme neg, ANA neg, ESR 5, CRP <0.1    ------    IMAGES:    MRI brain/face (04/25/14):    INTERPRETATION:  MRI BRAIN: There is some artifact again noted related to the scalp or  hair and this is not a grade visualization of the intracranial  structures. Again noted are few tiny subcortical white matter  hyperintense foci bifrontally. These remain stable. No new or  significant parenchymal signal abnormality. The ventricles are normal in  size, contour and position. No hemorrhage or evidence of acute  infarction is seen. The brainstem, cerebellum and the craniocervical  junction are unremarkable. No evidence of facial nerve or trigeminal  nerve lesion. The contrast enhancement pattern is normal.     MRI facial soft tissues: The detailed study of the facial tissues and  orbits demonstrates normal size and contour of the optic nerves. The  extraocular  muscles are symmetrical in size and enhance normally. There  is no evidence of an orbital mass. The cavernous sinuses and optic  chiasm are normal in appearance.    IMPRESSION:        1. MRI brain: Stable exam. No significant abnormality. No abnormal  cranial nerve enhancement.     2. MRI facial tissues and orbits: No significant abnormality. No mass  lesion.       Assessment:      1. Facial numbness. No clear triggers. CN V (V2-3) distribution. At her last visit, she described associated right eye tearing which suggested possible involvement of the lacrimal gland which is innervated by CN VII via CN V (V1). However, the perioral involvement of her lips suggested a possible deeper nuclear localization in the brainstem. She also describes an odd sensation of slow responses in her face, but no visible weakness. No other associated neurological deficits like double vision, vision loss, ptosis, dysarthria, dysphagia, or headache. She has a history of prolactinoma. Last MRI in Sept 2015 was non contrasted. Therefore, I repeated imaging of her brain, face, and orbit, to rule out a compressive neuropathy, looking at the cavernous sinuses as well. No abnormalities found. Labs also normal. Possible peripheral nerve neuropathy, but unclear cause.     2. Intermittent right arm tingling.  Found to have right supraspinatus tear and tendinopathy on MRI shoulder. MRI c-spine showed mild disc protrusions at C5-C5 and C6-C7.     Plan:     1.  Left facial numbness  Start gabapentin (NEURONTIN) 300 MG capsule QHS          -  Continue with current medications, including daily aspirin and statin for vascular risk factors.       Follow up: 4 months and as needed.    Nikki Dom, MD  [facility]  [facility]  T [phone] F [phone]

## 2014-05-10 ENCOUNTER — Ambulatory Visit (INDEPENDENT_AMBULATORY_CARE_PROVIDER_SITE_OTHER): Payer: BLUE CROSS/BLUE SHIELD | Admitting: Cardiovascular Disease

## 2014-05-17 ENCOUNTER — Ambulatory Visit (INDEPENDENT_AMBULATORY_CARE_PROVIDER_SITE_OTHER): Payer: BLUE CROSS/BLUE SHIELD | Admitting: Cardiovascular Disease

## 2014-05-17 ENCOUNTER — Ambulatory Visit
Admission: RE | Admit: 2014-05-17 | Discharge: 2014-05-17 | Disposition: A | Discharge status: Home / Self Care | Payer: BLUE CROSS/BLUE SHIELD | Source: Ambulatory Visit | Attending: Cardiovascular Disease | Admitting: Cardiovascular Disease

## 2014-05-17 VITALS — BP 111/76 | HR 83 | Wt 169.0 lb

## 2014-05-17 DIAGNOSIS — I25119 Atherosclerotic heart disease of native coronary artery with unspecified angina pectoris: Secondary | ICD-10-CM | POA: Insufficient documentation

## 2014-05-17 LAB — LIPID PANEL
Cholesterol / HDL Ratio: 2.9
Cholesterol: 150 mg/dL (ref 0–199)
HDL: 52 mg/dL (ref >40)
LDL Calculated: 79 mg/dL (ref 0–99)
Triglycerides: 95 mg/dL (ref 34–149)
VLDL Calculated: 19 mg/dL (ref 10–40)

## 2014-05-17 LAB — HEMOLYSIS INDEX: Hemolysis Index: 5 (ref 0–18)

## 2014-05-17 MED ORDER — TICAGRELOR 90 MG PO TABS
90.0000 mg | ORAL_TABLET | Freq: Once | ORAL | Status: AC
Start: 2014-05-17 — End: 2014-05-17

## 2014-05-17 MED ORDER — FUROSEMIDE 20 MG PO TABS
20.0000 mg | ORAL_TABLET | Freq: Every day | ORAL | Status: DC
Start: 2014-05-17 — End: 2014-07-20

## 2014-05-17 NOTE — Progress Notes (Signed)
[facility] OFFICE VISIT    I had the pleasure of seeing Ms. Fesler today for cardiovascular follow up. She is a pleasant 54 y.o. female with a history of  who presents for f/u after cath stent. She is s/p cardiac cath s/p stent to the left main and patent SVG to the OM. She has had atypical CP since then. No associated N/V with exertion. She walks 10000/day without any problems.    MEDICATIONS: She has a current medication list which includes the following prescription(s): aspirin - Take 1 tablet (81 mg total) by mouth daily, azelastine-fluticasone - by Nasal route daily as needed.   , budesonide-formoterol - Inhale 2 puffs into the lungs 2 (two) times daily as needed.   , cabergoline - Take 0.25 mg by mouth as needed.   , fluticasone - 2 sprays by Nasal route as needed.   , fluticasone-salmeterol - Inhale 1 puff into the lungs 2 (two) times daily as needed, gabapentin - Take 1 capsule (300 mg total) by mouth nightly (Patient taking differently: Take 300 mg by mouth as needed.   ), isosorbide mononitrate - Take 30 mg by mouth daily, metoprolol - Take 1 tablet (25 mg total) by mouth 2 (two) times daily, orlistat - Take 120 mg by mouth 3 (three) times daily as needed.   , paroxetine - Take 30 mg by mouth as needed.   , rosuvastatin - Take 40 mg by mouth daily, and vitamin d (ergocalciferol) - Take 50,000 Units by mouth every 30 (thirty) days.   Marland Kitchen    REVIEW OF SYSTEMS: All other systems reviewed and negative except as stated above.    PHYSICAL EXAMINATION  General Appearance: A well-appearing female in no acute distress.   Vital Signs: BP 111/76 mmHg  Pulse 83  Wt 76.658 kg (169 lb)   HEENT: Sclera anicteric, conjunctiva without pallor, moist mucous membranes, normal dentition.   Neck: Supple without jugular venous distention. Thyroid nonpalpable. Normal carotid upstrokes without bruits.  Chest: Clear to auscultation bilaterally with good air movement and respiratory effort and no wheezes, rales, or  rhonchi  Cardiovascular: Normal S1 and physiologically split S2 without murmurs, gallops or rub. PMI of normal size and nondisplaced.   Abdomen: Soft, nontender. No organomegaly.  No pulsatile masses or bruits.    Extremities: Warm without edema. All peripheral pulses are full and equal.  Skin: No rash, xanthoma or xanthelasma.   Neuro: Alert and oriented x3. Grossly intact. Strength is symmetrical. Normal mood and affect.     ECG: nsr ant t wave changes  LABS: will review.    IMPRESSION/RECOMMENDATIONS: Ms. Havrilla is a 54 y.o. female who presents for follow up. I have asked her to start cardiac rehab, check Lipid panel and f/u 2 months.        Fortino Sic, MD [facility]

## 2014-05-17 NOTE — Addendum Note (Signed)
Addended by: Fortino Sic on: 05/17/2014 09:58 AM     Modules accepted: Orders

## 2014-05-19 ENCOUNTER — Telehealth: Payer: Self-pay | Admitting: Surgery

## 2014-05-19 ENCOUNTER — Other Ambulatory Visit (INDEPENDENT_AMBULATORY_CARE_PROVIDER_SITE_OTHER): Payer: Self-pay | Admitting: Cardiovascular Disease

## 2014-05-19 DIAGNOSIS — I25708 Atherosclerosis of coronary artery bypass graft(s), unspecified, with other forms of angina pectoris: Secondary | ICD-10-CM

## 2014-05-19 NOTE — Progress Notes (Signed)
I spoke to patient by phone and she said she had constant chest pain for days, no N/V or diaphoresis. She occ had SOB with this pain. I reassured that this was unlikely to be cardiac but if it got worse she should go to the ER. I will ask the office to arrange for ex nuclear off medical Rx early next week and f/u with me.

## 2014-05-25 ENCOUNTER — Ambulatory Visit (INDEPENDENT_AMBULATORY_CARE_PROVIDER_SITE_OTHER): Payer: BLUE CROSS/BLUE SHIELD | Admitting: Cardiovascular Disease

## 2014-05-27 ENCOUNTER — Telehealth (INDEPENDENT_AMBULATORY_CARE_PROVIDER_SITE_OTHER): Payer: Self-pay

## 2014-05-27 NOTE — Telephone Encounter (Signed)
Called pt to inquire about status of scheduling a Nuclear stress test. Pt says that she wants to hold off on the stress test and also her appt with Dr. Glean Hess until after May 26th as she has an appt that day at [facility] for a test. Pt also wants to hold off on Cardiac Rehab until she has this test done. She is not exactly sure what the test is called but says she believes it is a CT scan. Pt says she will come see Dr. Glean Hess after the test.         Marc Morgans, MA

## 2014-05-30 ENCOUNTER — Encounter (INDEPENDENT_AMBULATORY_CARE_PROVIDER_SITE_OTHER): Payer: Self-pay | Admitting: Cardiovascular Disease

## 2014-05-31 ENCOUNTER — Encounter (INDEPENDENT_AMBULATORY_CARE_PROVIDER_SITE_OTHER): Payer: Self-pay

## 2014-06-03 ENCOUNTER — Ambulatory Visit (INDEPENDENT_AMBULATORY_CARE_PROVIDER_SITE_OTHER): Payer: BLUE CROSS/BLUE SHIELD | Admitting: Specialist

## 2014-06-03 ENCOUNTER — Encounter (INDEPENDENT_AMBULATORY_CARE_PROVIDER_SITE_OTHER): Payer: Self-pay | Admitting: Specialist

## 2014-06-03 VITALS — BP 112/74 | HR 68 | Ht 64.5 in | Wt 170.8 lb

## 2014-06-03 DIAGNOSIS — Z780 Asymptomatic menopausal state: Secondary | ICD-10-CM

## 2014-06-03 DIAGNOSIS — D352 Benign neoplasm of pituitary gland: Secondary | ICD-10-CM

## 2014-06-03 DIAGNOSIS — R635 Abnormal weight gain: Secondary | ICD-10-CM

## 2014-06-03 NOTE — Progress Notes (Signed)
Jun 03, 2014.     PRESENTING COMPLAINT:  1.  History of hyperprolactinemia.  2.  History of pituitary adenoma.     HISTORY OF PRESENT ILLNESS:  [name] is a 54 year old who presented to clinic for initial visit.         [name] does state that she was [facility] with [facility]  She presented with symptoms of  weight gain and fatigue and as part of the evaluation was noted to have  galacturia and elevated prolactin.  She was started on treatment with  cabergoline and her symptoms resolved.  It is unclear if she had irregular  menses or amenorrhea.       Contributing factors to hyperprolactinemia with a presence of a pituitary  adenoma.  I do not have the baseline records.  The patient tells me it was  0.9 cm.  She tells me a followup MRI was done once a year and showed  response to treatment.  Recent MRI done in the [facility] in March  of 2016, does not comment on the pituitary gland.     The patient discontinued cabergoline approximately 7 months ago.  She has  not had any recurrence of symptoms of galacturia.  She, however, does  notice fatigue and weight gain.     Coincidentally patient has also not had menses in 1 year.  She is 54 years  old.  She tells me she has fibroids.     In terms of complications, no fragility fractures, bone mineral density  scan is unknown to me.     In terms of other contributing factors to weight gain, may be menopause.   Thyroid function tests are not available to me.  She does not have any  other symptoms of thyroid deficiency such as cold intolerance,  constipation, hair loss, dry hair, dry skin or low mood.     In terms of other contributing factors to weight gain, there are no signs  or symptoms of hypercortisolism.  She does not have discoloration of skin.   No muscle weakness.  No known diagnosis of hypertension or hypoglycemia.     A prior hemoglobin A1c in September 2015 is noted to be in the normal  range.      The patient has extensive coronary artery disease.  She follows with  cardiology for this.  She had a cardiac catheterization, status post stent  to the left main coronary artery and is status post CABG x2 in September of  2015.  She complains of left-sided chest pain that has been evaluated by  cardiology for this.     REVIEW OF SYSTEMS:  As mentioned above.  In addition, denies nausea, vomiting, abdominal pain,  hematuria, dysuria, chest pain.  Does complain of unexplained chest pain  and shortness of breath.  Complains of weight gain and fatigue.  Denies  lower extremity pain or swelling.  Denies galacturia.  She has not had  menses in over 1 year.  Denies skin changes.  Denies back pain, fever,  unexplained weight loss.  Mood is stable.  Other systems reviewed and were  negative.     PAST MEDICAL HISTORY:  Significant for history of benign prolactinoma diagnosed in 2005, history  of fibroids, CABG x2, hyperlipidemia, right toe surgery, appendectomy,  uterine fibroid surgery, cardiac catheterization.     FAMILY HISTORY:  Mother with a history of coronary disease, status post cardiac  stent.     PERSONAL AND SOCIAL HISTORY:    She drinks alcoholic beverages occasionally.  Denies any tobacco use or  illicit drug use.     CURRENT MEDICATIONS:  Aspirin 81 mg daily, Symbicort inhaler as needed, cabergoline not taking,  Imdur 30 mg daily, metoprolol 25 mg twice a day, Orlistat 120 mg daily,  Rosuvastatin 40 mg daily, vitamin D 50,000 international units weekly,  Paxil 30 mg daily.     ALLERGIES:  No known drug allergies.     PHYSICAL EXAMINATION:  VITAL SIGNS:  Blood pressure 112/74, pulse 68 per minute regular, weight  170 pounds, height 5 feet 4 inches, BMI 28.8.  GENERAL:  Pleasant, conversant, in no acute distress.  NECK:  Supple, no lymphadenopathy, thyroid palpable, not enlarged, no  discrete nodules felt on exam today, nontender, mobile.  LUNGS:  Clear to auscultation bilaterally.  Surgical scar  noted.  CARDIOVASCULAR:  Regular rate and rhythm, no murmurs, gallops.  CHEST:  No expressible galactorrhea seen.  ABDOMEN:  Soft, nontender, bowel sounds were heard.  EXTREMITIES:  No edema seen.  NEUROLOGIC:  Alert and oriented x3.  No tremors seen.  Deep tendon reflexes  1+ at the ankle and at the knees.  SKIN:  Warm and dry, no rash seen, no acanthosis nigricans seen.  No purple  striae noted.  No buffalo hump seen.  PSYCHIATRIC:  Mood and affect normal.     LABORATORY DATA:  None.     ASSESSMENT AND PLAN:  [name] is a 54 year old who presented to clinic for initial visit.  1.  History of prolactinoma.  The patient may be in menopause, would like  to check gonadotropins to confirmed this.     I reviewed with the patient that if she is in menopause, there is no  indication for treatment with cabergoline anymore.     However, she should discuss hormone replacement therapy with her  gynecologist for bone protection.     I would also check her fasting prolactin to confirm this.  If her fasting  prolactin is normal, would like to get a pituitary MRI for further  assessment.     If she has no evidence of pituitary adenoma, no further followup is needed.   If she has pituitary adenoma, I requested her to send [place] her prior records  for comparison.  I would like to check for other pituitary hormones.     2.  Weight gain.  This may be multifactorial, would like to check thyroid  function test to rule out uncontrolled hypothyroidism.  I would also like  to screen for diabetes getting hemoglobin A1c.     I will update her when the results of the biochemical analysis are  available and will update her when the results of the pituitary MRI are  available.     It was a pleasure taking care of her in our clinic today.

## 2014-06-16 ENCOUNTER — Ambulatory Visit (INDEPENDENT_AMBULATORY_CARE_PROVIDER_SITE_OTHER): Payer: BLUE CROSS/BLUE SHIELD | Admitting: Cardiovascular Disease

## 2014-06-29 ENCOUNTER — Ambulatory Visit (INDEPENDENT_AMBULATORY_CARE_PROVIDER_SITE_OTHER): Payer: BLUE CROSS/BLUE SHIELD | Admitting: Cardiovascular Disease

## 2014-07-06 ENCOUNTER — Encounter (INDEPENDENT_AMBULATORY_CARE_PROVIDER_SITE_OTHER): Payer: Self-pay

## 2014-07-07 ENCOUNTER — Telehealth (INDEPENDENT_AMBULATORY_CARE_PROVIDER_SITE_OTHER): Payer: Self-pay

## 2014-07-07 NOTE — Telephone Encounter (Signed)
-----   Message from Marcellina Millin, MD sent at 07/06/2014  1:07 PM EDT -----  Please inform her that labs done at [facility] recently show normal thyroid and prolactin level.    She has elevated FSH AND LH that indicate she is in menopause and should establish care with Gynecologist for this

## 2014-07-07 NOTE — Telephone Encounter (Signed)
Patient informed labs done at [facility] recently show normal thyroid and prolactin level.   She has elevated FSH AND LH that indicate she is in menopause and should establish care with Gynecologist for this  Patient verbalized understanding.

## 2014-07-20 ENCOUNTER — Encounter (INDEPENDENT_AMBULATORY_CARE_PROVIDER_SITE_OTHER): Payer: Self-pay | Admitting: Cardiovascular Disease

## 2014-07-20 ENCOUNTER — Ambulatory Visit (INDEPENDENT_AMBULATORY_CARE_PROVIDER_SITE_OTHER): Payer: BLUE CROSS/BLUE SHIELD | Admitting: Cardiovascular Disease

## 2014-07-20 ENCOUNTER — Encounter (INDEPENDENT_AMBULATORY_CARE_PROVIDER_SITE_OTHER): Payer: Self-pay | Admitting: Internal Medicine

## 2014-07-20 VITALS — BP 106/73 | HR 73 | Wt 174.0 lb

## 2014-07-20 DIAGNOSIS — I25709 Atherosclerosis of coronary artery bypass graft(s), unspecified, with unspecified angina pectoris: Secondary | ICD-10-CM

## 2014-07-20 MED ORDER — ASPIRIN 81 MG PO CHEW
81.0000 mg | CHEWABLE_TABLET | Freq: Every day | ORAL | Status: AC
Start: 2014-07-20 — End: ?

## 2014-07-20 MED ORDER — TICAGRELOR 90 MG PO TABS
90.0000 mg | ORAL_TABLET | Freq: Once | ORAL | Status: AC
Start: 2014-07-20 — End: 2014-07-20

## 2014-07-20 NOTE — Progress Notes (Signed)
[facility] VISIT    I had the pleasure of seeing Ms. Thorp today for cardiovascular follow up. She is a pleasant 54 y.o. female with a history of cad,  who presents for Cv follow up. She has had a recent PET scan that does not show any sig ischemia. She has had mild left parasternal pain and this gets better after nitro. She is walking 10, 000 steps without CP.    MEDICATIONS: She has a current medication list which includes the following prescription(s): aspirin - Take 1 tablet (81 mg total) by mouth daily, azelastine-fluticasone - by Nasal route daily as needed.   , budesonide-formoterol - Inhale 2 puffs into the lungs 2 (two) times daily as needed.   , cabergoline - Take 0.25 mg by mouth as needed.   , fluticasone - 2 sprays by Nasal route as needed.   , fluticasone-salmeterol - Inhale 1 puff into the lungs 2 (two) times daily as needed, furosemide - Take 1 tablet (20 mg total) by mouth daily, gabapentin - Take 1 capsule (300 mg total) by mouth nightly (Patient taking differently: Take 300 mg by mouth as needed.   ), isosorbide mononitrate - Take 30 mg by mouth daily, metoprolol - Take 1 tablet (25 mg total) by mouth 2 (two) times daily, orlistat - Take 120 mg by mouth 3 (three) times daily as needed.   , paroxetine - Take 30 mg by mouth as needed.   , rosuvastatin - Take 40 mg by mouth daily, and vitamin d (ergocalciferol) - Take 50,000 Units by mouth every 30 (thirty) days.   Marland Kitchen    REVIEW OF SYSTEMS: All other systems reviewed and negative except as stated above.    PHYSICAL EXAMINATION  General Appearance: A well-appearing female in no acute distress.   Vital Signs: BP 106/73 mmHg  Pulse 73  Wt 78.926 kg (174 lb)   HEENT: Sclera anicteric, conjunctiva without pallor, moist mucous membranes, normal dentition.   Neck: Supple without jugular venous distention. Thyroid nonpalpable. Normal carotid upstrokes without bruits.  Chest: Clear to auscultation bilaterally with good air movement and  respiratory effort and no wheezes, rales, or rhonchi, + sternal wires.  Cardiovascular: Normal S1 and physiologically split S2 without murmurs, gallops or rub. PMI of normal size and nondisplaced.   Abdomen: Soft, nontender. No organomegaly.  No pulsatile masses or bruits.    Extremities: Warm without edema. All peripheral pulses are full and equal.  Skin: No rash, xanthoma or xanthelasma.   Neuro: Alert and oriented x3. Grossly intact. Strength is symmetrical. Normal mood and affect.     ECG:nsr ant t wave inversions present no changes    IMPRESSION/RECOMMENDATIONS: Ms. Hedgecock is a 54 y.o. female who presents for follow up. Will review most recent labs done at [facility]. I will have her get a baseline echo prior to use of phentaramine.         Fortino Sic, MD [facility]

## 2014-08-05 ENCOUNTER — Encounter (INDEPENDENT_AMBULATORY_CARE_PROVIDER_SITE_OTHER): Payer: Self-pay | Admitting: Cardiovascular Disease

## 2014-08-16 ENCOUNTER — Encounter (INDEPENDENT_AMBULATORY_CARE_PROVIDER_SITE_OTHER): Payer: Self-pay | Admitting: Internal Medicine

## 2014-10-27 ENCOUNTER — Ambulatory Visit (INDEPENDENT_AMBULATORY_CARE_PROVIDER_SITE_OTHER): Payer: BLUE CROSS/BLUE SHIELD | Admitting: Surgery

## 2014-10-27 ENCOUNTER — Other Ambulatory Visit (INDEPENDENT_AMBULATORY_CARE_PROVIDER_SITE_OTHER): Payer: BLUE CROSS/BLUE SHIELD

## 2014-12-19 ENCOUNTER — Encounter: Payer: Self-pay | Admitting: Vascular & Interventional Radiology

## 2014-12-19 ENCOUNTER — Ambulatory Visit: Payer: BLUE CROSS/BLUE SHIELD | Attending: Obstetrics & Gynecology | Admitting: Vascular & Interventional Radiology

## 2014-12-19 VITALS — BP 107/55 | Ht 65.0 in | Wt 172.0 lb

## 2014-12-19 DIAGNOSIS — Z7982 Long term (current) use of aspirin: Secondary | ICD-10-CM | POA: Insufficient documentation

## 2014-12-19 DIAGNOSIS — I2510 Atherosclerotic heart disease of native coronary artery without angina pectoris: Secondary | ICD-10-CM | POA: Insufficient documentation

## 2014-12-19 DIAGNOSIS — Z79899 Other long term (current) drug therapy: Secondary | ICD-10-CM | POA: Insufficient documentation

## 2014-12-19 DIAGNOSIS — E785 Hyperlipidemia, unspecified: Secondary | ICD-10-CM | POA: Insufficient documentation

## 2014-12-19 DIAGNOSIS — D219 Benign neoplasm of connective and other soft tissue, unspecified: Secondary | ICD-10-CM | POA: Insufficient documentation

## 2014-12-19 DIAGNOSIS — N939 Abnormal uterine and vaginal bleeding, unspecified: Secondary | ICD-10-CM | POA: Insufficient documentation

## 2014-12-19 DIAGNOSIS — D259 Leiomyoma of uterus, unspecified: Secondary | ICD-10-CM | POA: Insufficient documentation

## 2014-12-19 DIAGNOSIS — N950 Postmenopausal bleeding: Secondary | ICD-10-CM | POA: Insufficient documentation

## 2014-12-19 DIAGNOSIS — Z951 Presence of aortocoronary bypass graft: Secondary | ICD-10-CM | POA: Insufficient documentation

## 2014-12-19 NOTE — Progress Notes (Addendum)
[facility]  [facility]     Consult Note     Referring Clinician:  Dr. Marchia Meiers    HPI/Reason for Consultation:   [name] presented today for evaluation of her candidacy for uterine fibroid embolization (UFE) as a treatment option for her uterine fibroids. She is a 54 y.o. G0 postmenopausal female with the primary complaint of post menopausal bleeding.  She was initially diagnosed with uterine fibroids approximately 20 years ago and has undergone myomectomy twice.  The last myomectomy was 12 years ago.  Patient then considered UFE approximately 2-3 years ago as she was experiencing recurrent menorrhagia at that time, however, she was peri menopausal and eventually menopausal.  Patient reports that she has been amenorrheic for close to two years, however, in September 2016 she experienced 10 days of vaginal bleeding.  She reports that bleeding as heaving and required a pad change every 1/2 hour.  She notes that the bleeding stopped after she had an endometrial biopsy on 10/25/2014.  She has had no further vaginal bleeding.  Additional complaints include nocturia, bloating and constipation.  She reports that these symptoms have been chronic.  She denies experiencing pelvic pain, pelvic pressure, backaches, leg pain or painful intercourse. There is no history of endometriosis, endometrial polyp and ovarian cyst.      Past Medical History:     Past Medical History   Diagnosis Date   . Fibroid    . 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 Hyperlipidemia    . CAD (coronary artery disease)    . Prolactinoma      Diagnosed in 2005       Past Surgical History:     Past Surgical History   Procedure Laterality Date   . Toe surgery Right 06/2012   . Appendectomy  05/1993   . Myomectomy       1996, 2004   . Coronary artery bypass N/A 10/05/2013     Procedure: CORONARY ARTERY BYPASS;  Surgeon: Annie Main, MD;  Location: [facility];  Service: Cardiothoracic;  Laterality: N/A;  LIMA TO LAD      .  Endoscopic,vein harvest N/A 10/05/2013     Procedure: ENDOSCOPIC,VEIN HARVEST;  Surgeon: Annie Main, MD;  Location: [facility];  Service: Cardiothoracic;  Laterality: N/A;   . Tee  10/05/2013     Procedure: TEE;  Surgeon: Annie Main, MD;  Location: [facility];  Service: Cardiothoracic;;  probe# [place] probe   . Coronary stent placement  04/2014   . Endometrial biopsy  10/2014     negative       Allergies:   No Known Allergies    Medications:     Current Outpatient Prescriptions   Medication Sig Dispense Refill   . cabergoline (DOSTINEX) 0.5 MG tablet Take 0.25 mg by mouth as needed.        . isosorbide mononitrate (IMDUR) 30 MG 24 hr tablet Take 30 mg by mouth daily.     . metoprolol (LOPRESSOR) 25 MG tablet Take 1 tablet (25 mg total) by mouth 2 (two) times daily. 60 tablet 2   . orlistat (XENICAL) 120 MG capsule Take 120 mg by mouth 3 (three) times daily as needed.        . ranolazine (RANEXA) 1000 MG SR tablet Take 1,000 mg by mouth 2 (two) times daily.     . rosuvastatin (CRESTOR) 40 MG tablet Take 40 mg by mouth daily.     Marland Kitchen  ticagrelor (BRILINTA) 90 MG Tab Take 90 mg by mouth every 12 (twelve) hours.     Marland Kitchen aspirin (ASPIRIN CHILDRENS) 81 MG chewable tablet Chew 1 tablet (81 mg total) by mouth daily. 90 tablet 1   . Vitamin D, Ergocalciferol, (DRISDOL) 50000 UNIT Cap Take 50,000 Units by mouth every 30 (thirty) days.          No current facility-administered medications for this visit.       Medication list, dosage, frequency and administration were reviewed.    Family History:     Family History   Problem Relation Age of Onset   . Heart disease Mother    . Heart attack Mother 44   . Stent Mother    . Coronary artery disease Mother    . Clotting disorder Neg Hx    . Diabetes Neg Hx        Social History:     Social History     Social History   . Marital Status: Married     Spouse Name: N/A   . Number of Children: N/A   . Years of Education: N/A     Occupational History   . Not on file.     Social  History Main Topics   . Smoking status: Never Smoker    . Smokeless tobacco: Not on file   . Alcohol Use: 0.0 oz/week     0 Standard drinks or equivalent per week   . Drug Use: No   . Sexual Activity: Not on file     Other Topics Concern   . Not on file     Social History Narrative       Review of Systems:   A comprehensive review of systems is negative, except for:    [] General: weight: []  Fatigue    [] Weight Loss     [] Fever     []  Chills    []  Rigors    [] Respiratory: [] Shortness of breath     []  Cough     [] Wheeze   []  Hemoptysis    [] Cardiac: []  Chest pain     []  Palpitations       []  PND          []  Cold extremities    [x] GI: []  Reflux      []  Dyspepsia      []  abdominal pain   []  nausea    [] vomiting                                             []  diarrhea      [x]  constipation       []  blood in stool    [x] Genito-urinary: []  Burning with urination     [x]  Frequency (nocturia)    []  Urgency    []  Hematuria                                  [] Musculoskeletal: []  joint pain       []  Arthritis     []  back pain    [] CNS: []  focal weakness     []  headache     []  Seizures      []  double vision     []  fainting    [] Endocrine:   []  polyuria        []   polydipsia          [] General weakness    [] Skin: []  Rash         []  Pruritis         []   Ecchymosis        []  Varicose Veins     []  Edema    Physical Exam:   Constitutional:  She appears well, in no distress  Head: Normocephalic.   Eyes: EOM are normal. Sclera anicteric.  Abdomen:  Soft, non tender, non distended, + bowel sounds.  Musculoskeletal: Normal range of motion.   Lower Extremities:  No edema.   Neurological: Non focal.   Skin: Skin is warm and dry.     Labs:   None drawn for this visit.    Rads/Results:   Pelvic Sonogram 11/12/2014:  Uterus is retroverted and measures 9.1 x 7.4 x 9.2 cm, uterine volume of 321 cc.  There are two uterine fibroids which are unchanged in size and a single new fibroid when compared to a previous ultrasounds from 09/25/2010 and  09/08/2008.      Endometrial biopsy 10/25/2014:  No cytologic atypia identified.    Genital culture 10/25/2014:  Positive for Ecoli.  (patient was subsequently treated with a course of antibiotics)    Assessment:   1)  Uterine Fibroids    2)  Post menopausal bleeding    It is unclear regarding patient's candidacy for UFE given the somewhat unusual clinical presentation with post menopausal vaginal bleeding.  Although last month's endometrial biopsy was negative for atypical cells, a possible malignant etiology for her bleeding should be considered.  Recommend pelvic MRI to better evaluate the uterine fibroids as well as to evaluate for other possible underlying pathology.    The procedure of UFE for treatment of symptomatic uterine fibroids was discussed in detail with the patient including the procedural details, possible risks, anticipated benefits, peri/post procedure course and alternative treatment options.    Plan:    Patient will schedule pelvic MRI for the near future.   Patient will return to further discuss UFE candidacy after the MRI is obtained.  Will also follow up Dr. Myer Haff regarding patient's UFE candidacy after the MRI has been reviewed.   Dr. Myer Haff has referred patient for gyn oncology evaluation, however, patient wishes to have the MRI first.   Will obtain results of recently drawn hormonal evaluation.    All of the patient's questions were answered.      More than half of this 30 minute visit was spent in counseling and coordination of care.    Signed by: Koren Bound, NP  CVIR Department   [facility]  [phone]      I have evaluated the patient and agree with the above documented evaluation and plan.    Signed by: Dara Lords, MD  CVIR Department  [phone]

## 2014-12-24 ENCOUNTER — Ambulatory Visit
Admission: RE | Admit: 2014-12-24 | Discharge: 2014-12-24 | Disposition: A | Discharge status: Home / Self Care | Payer: BLUE CROSS/BLUE SHIELD | Source: Ambulatory Visit | Attending: Registered Nurse | Admitting: Registered Nurse

## 2014-12-24 DIAGNOSIS — D259 Leiomyoma of uterus, unspecified: Secondary | ICD-10-CM

## 2014-12-24 DIAGNOSIS — N950 Postmenopausal bleeding: Secondary | ICD-10-CM | POA: Insufficient documentation

## 2014-12-24 DIAGNOSIS — D252 Subserosal leiomyoma of uterus: Secondary | ICD-10-CM | POA: Insufficient documentation

## 2014-12-24 DIAGNOSIS — N852 Hypertrophy of uterus: Secondary | ICD-10-CM | POA: Insufficient documentation

## 2014-12-24 LAB — WHOLE BLOOD CREATININE WITH GFR POCT
GFR POCT: 55 mL/min/{1.73_m2} — ABNORMAL LOW (ref >60)
Whole Blood Creatinine POCT: 1.3 mg/dL — ABNORMAL HIGH (ref 0.5–1.1)

## 2014-12-24 MED ORDER — GADOBUTROL 1 MMOL/ML IV SOLN
7.0000 mL | Freq: Once | INTRAVENOUS | Status: AC | PRN
Start: 2014-12-24 — End: 2014-12-24
  Administered 2014-12-24: 7 mmol via INTRAVENOUS

## 2015-01-05 ENCOUNTER — Institutional Professional Consult (permissible substitution): Payer: BLUE CROSS/BLUE SHIELD

## 2015-02-07 ENCOUNTER — Ambulatory Visit (INDEPENDENT_AMBULATORY_CARE_PROVIDER_SITE_OTHER): Payer: BLUE CROSS/BLUE SHIELD | Admitting: Cardiovascular Disease

## 2015-02-07 ENCOUNTER — Encounter (INDEPENDENT_AMBULATORY_CARE_PROVIDER_SITE_OTHER): Payer: Self-pay | Admitting: Cardiovascular Disease

## 2015-02-07 DIAGNOSIS — I10 Essential (primary) hypertension: Secondary | ICD-10-CM

## 2015-02-07 DIAGNOSIS — I2510 Atherosclerotic heart disease of native coronary artery without angina pectoris: Secondary | ICD-10-CM

## 2015-02-07 DIAGNOSIS — Z951 Presence of aortocoronary bypass graft: Secondary | ICD-10-CM

## 2015-02-07 DIAGNOSIS — E7800 Pure hypercholesterolemia, unspecified: Secondary | ICD-10-CM

## 2015-02-07 MED ORDER — EZETIMIBE 10 MG PO TABS
10.0000 mg | ORAL_TABLET | Freq: Every day | ORAL | Status: AC
Start: 2015-02-07 — End: ?

## 2015-02-07 NOTE — Progress Notes (Signed)
[place] [facility] OFFICE CONSULTATION    I had the pleasure of seeing [name] today for cardiovascular evaluation. She is a pleasant 55 y.o. female with a history of coronary artery disease with a complicated past medical history.  She apparently presented with chest discomfort symptoms to her PCP and was referred to a cardiologist in the fall of 2015.  A stress test was apparently within normal limits.  On her own, she completed a virtual physical examination which revealed that she had a very high calcium score.  She subsequently underwent a nuclear stress test that showed lateral wall ischemia.  Thereafter, she had a cardiac catheterization that showed a high grade left main stenosis of 60-70 percent, ostial large circumflex of 90 percent, proximal left anterior descending lesion of 80 percent, a small diminutive right coronary artery and normal left ventricular ejection fraction at 60 percent.  She underwent bypass surgery with LIMA to left anterior descending and saphenous vein graft to the obtuse marginal branch.  She did not feel that she had significant improvement in her symptoms, however.  A repeat nuclear stress test showed moderate anterolateral wall ischemia.  It was recommended that she complete another cardiac catheterization.    In December 2015, a repeat catheterization showed the distal left main stenosis of 60 percent with patent LIMA to left anterior descending, which had competitive flow and was underfilled.  There was narrowing of the obtuse marginal branch had 50-60 percent with preserved ejection fraction.  It appeared that her bypass vessels or not optimal and therefore there was a suboptimal result.  No intervention was completed.  Medical therapy was recommended.    By April 2016, she had increasing in shortness of breath and underwent another catheterization.  The left main vessel now appeared moderately diseased at about 70 percent eccentric plaque.  The vein graft to the  marginal branch continued to show 40-50 percent stenosis.  The LIMA to the left anterior descending was now occluded in its midportion.  She underwent a successful distal left main stent implant, but there was residual stenosis of 30 percent.    Again, her symptoms persisted and she was seen one day after work in [facility].  She had another catheterization and a repeat angioplasty of the left main.  There was still a 30 percent residual.  She was seen by cardiologist and [place]  and ultimately started on Ranexa.  Since starting on Ranexa, she has had some relief.  Just recently, her dose of Ranexa was increased.    She tells me that she gets in her 10,000 steps per day and occasionally does the stationary bike.  She has gone through cardiac rehabilitation.  Currently, she is doing well with respect to chest discomfort symptoms.  She has noticed a different chest discomfort.  Upon rolling on her left side at night, but it appears reproducible and is not her typical angina.  Her typical angina is typically left and mid chest discomfort and right arm pain.  That seems to be improved on the Ranexa.    She is here today for another opinion of sorts.  She is curious if there are any other options available to her for treatment.    PAST MEDICAL HISTORY: She has a past medical history of Fibroid; S/P CABG x 2 (10/05/2013); Hyperlipidemia; CAD (coronary artery disease); and Prolactinoma. She has past surgical history that includes Toe Surgery (Right, 06/2012); Appendectomy (05/1993); Myomectomy; CORONARY ARTERY BYPASS (N/A, 10/05/2013); ENDOSCOPIC,VEIN HARVEST (N/A, 10/05/2013);  TEE (10/05/2013); Coronary stent placement (04/2014); and Endometrial biopsy (10/2014).    MEDICATIONS:   Current Outpatient Prescriptions   Medication Sig   . aspirin (ASPIRIN CHILDRENS) 81 MG chewable tablet Chew 1 tablet (81 mg total) by mouth daily.   . cabergoline (DOSTINEX) 0.5 MG tablet Take 0.25 mg by mouth as needed.      .  isosorbide mononitrate (IMDUR) 30 MG 24 hr tablet Take 30 mg by mouth 2 (two) times daily.      . metoprolol (LOPRESSOR) 25 MG tablet Take 1 tablet (25 mg total) by mouth 2 (two) times daily.   Marland Kitchen orlistat (XENICAL) 120 MG capsule Take 120 mg by mouth 3 (three) times daily as needed.      . ranolazine (RANEXA) 1000 MG SR tablet Take 1,000 mg by mouth 2 (two) times daily.   . rosuvastatin (CRESTOR) 40 MG tablet Take 40 mg by mouth daily.   . ticagrelor (BRILINTA) 90 MG Tab Take 90 mg by mouth every 12 (twelve) hours.   . Vitamin D, Ergocalciferol, (DRISDOL) 50000 UNIT Cap Take 50,000 Units by mouth every 30 (thirty) days.         ALLERGIES: No Known Allergies    FAMILY HISTORY: Her family history includes Coronary artery disease in her mother; Heart attack (age of onset: 60) in her mother; Heart disease in her mother; Stent in her mother. There is no history of Clotting disorder or Diabetes.    SOCIAL HISTORY: She reports that she has never smoked. She does not have any smokeless tobacco history on file. She reports that she drinks alcohol. She reports that she does not use illicit drugs.    REVIEW OF SYSTEMS: All other systems reviewed and negative except as stated above.     PHYSICAL EXAMINATION  General Appearance:  A well-appearing female in no acute distress.    Vital Signs: BP 98/64 mmHg  Pulse 56  Resp 16  Ht 1.676 m (5\' 6" )  Wt 79.379 kg (175 lb)  BMI 28.26 kg/m2  SpO2 98%   HEENT: Sclera anicteric, conjunctiva without pallor, moist mucous membranes, normal dentition. No arcus.   Neck:  Supple without jugular venous distention. Thyroid nonpalpable. Normal carotid upstrokes without bruits.   Chest: Clear to auscultation bilaterally with good air movement and respiratory effort and no wheezes, rales, or rhonchi   Cardiovascular: RRR, normal S1 and physiologically split S2 without murmurs, gallops or rub. PMI of normal size and nondisplaced.   Abdomen: Soft, nontender, nondistended, with normoactive bowel  sounds. No organomegaly.  No pulsatile masses, or bruits.   Extremities: Warm without edema, clubbing, or cyanosis. All peripheral pulses are full and equal.   Skin: No rash, xanthoma or xanthelasma.   Neuro: Alert and oriented x3. Grossly intact. Strength is symmetrical. Normal mood and affect.     CARDIAC CATH:   04/28/14:  Left coronary angiography demonstrates a left main with a distal left main  stenosis of 70%, the plaque mostly directed at the circumflex marginal  branch. There is a small ulcerated plaque up at the superior portion of  the left main and the ostium of the LAD. After stent implantation, the  lesion is reduced to 30% and there is more improved flow into that  distribution. There is competitive filling of the distal circumflex  marginal branch, but I think this was from the bypass graft filling distally.    08/31/2014:  Native 2 vessel CAD  Left main disease  SVG to OM patent  LIMA atretic  Successful PCI of distal LM/ostial LAD with POBA   Due to the heavily calcified distal LM/proximal LAD stented area multiple balloons at high pressure inflations were utilized on the distal  LM/proximal LAD. Post PCI IVUS showed cross sectional area of 5.2 with lumen diameter about 3, The vessel was noted to still have 40% stenosis. Repeat angiography of SVG to Circ showed patent system and noted retrograde flow from Circ did not fill the LAD system due to marked improvement in flow from the left main     PET/CT Myocardial Perfusion 06/22/2014  1- Stress only N-13 amonia PET/CT myocardial perfusion imaging demonstrates no evidence of myocardial ischemia during pharmacologically-stimulated flow increases  2- No evidence of myocardial infarction  3- Gated tomographic images demonstrate normal left ventricular wall motion. LVEF equals 61% during peak stress.    IMPRESSION/RECOMMENDATIONS: [name] is a 55 y.o. female with known severe coronary artery disease who has had multiple catheterizations.  Unfortunately,  her original bypass was limited in that her LIMA and saphenous vein grafts were already diseased.  Her left main stent has been somewhat suboptimal with residual stenosis.  Recently, she has had some relief of her stable angina with Ranexa.  At this time, there does not appear to be a reason to intervene again.    Her drug regimen appears to be complete with aspirin, a beta blocker, a nitrate, statin, and Brilinta.  I have asked her to start Zetia at this time.  In the event that her lipids are suboptimal, we can consider adding something like Repatha, however, that would be driving her blood pressure extremely low.  She is going to have repeat blood work in one month in follow-up.  At that time, at which time we will see if Repatha would be appropriate.    In the interim, she should continue to stay active.  She is very concerned about weight loss and not being able to decrease in weight despite activity and following a good diet.    PLAN:  1.  Start Zetia 10 mg a day  2.  Fasting laboratory studies in one month  3.  Follow up after testing  4.  To consider starting Repatha  5.  Diet, exercise, and weight loss

## 2015-02-07 NOTE — Patient Instructions (Signed)
Start Zetia    Complete fasting blood work in 4 weeks    Follow up after blood work is complete

## 2015-03-16 ENCOUNTER — Encounter (INDEPENDENT_AMBULATORY_CARE_PROVIDER_SITE_OTHER): Payer: BLUE CROSS/BLUE SHIELD | Admitting: Cardiovascular Disease

## 2015-03-17 ENCOUNTER — Encounter (INDEPENDENT_AMBULATORY_CARE_PROVIDER_SITE_OTHER): Payer: Self-pay

## 2015-06-08 ENCOUNTER — Encounter (INDEPENDENT_AMBULATORY_CARE_PROVIDER_SITE_OTHER): Payer: Self-pay

## 2015-09-20 ENCOUNTER — Encounter (INDEPENDENT_AMBULATORY_CARE_PROVIDER_SITE_OTHER): Payer: Self-pay

## 2016-01-21 IMAGING — CT CT ANGIO CHEST
1 of 2 series · 19 of 32 positions shown · IV contrast (OMNIPAQUE 300)
Comparison: None.

CLINICAL DATA: Shortness of breath and chest pain

EXAM:
CT ANGIOGRAPHY CHEST WITH CONTRAST
TECHNIQUE: Multidetector CT imaging of the chest was performed using the
standard protocol during bolus administration of intravenous
contrast. Multiplanar CT image reconstructions and MIPs were
obtained to evaluate the vascular anatomy.
CONTRAST:  100mL OMNIPAQUE IOHEXOL 350 MG/ML SOLN

[Series 11: thins for pacs · axial · 0.63mm/px · z∈[-48,+162]mm · 19 of 235 slices shown]
[im 12/235  lung]
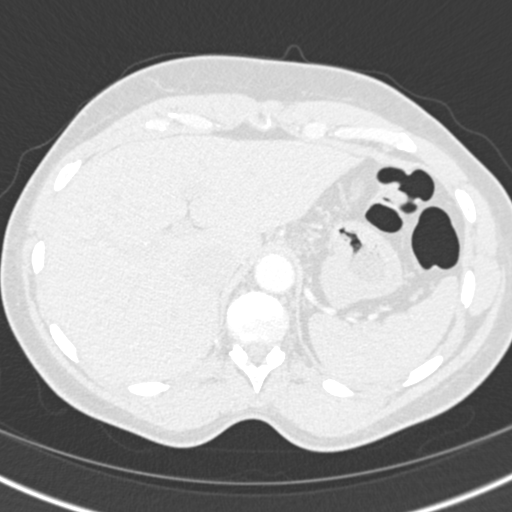
[im 24/235  mediastinal]
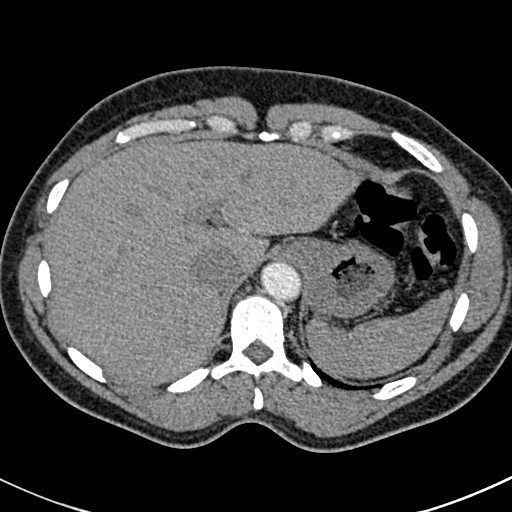
[im 36/235  lung]
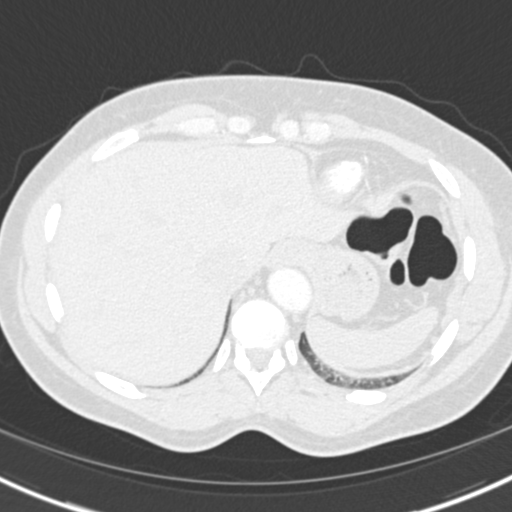
[im 59/235  mediastinal]
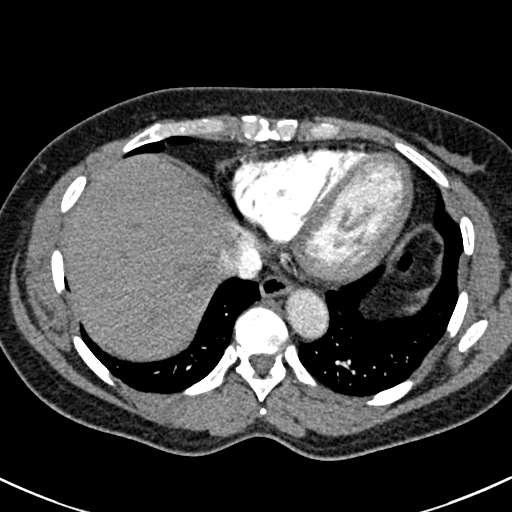
[im 71/235  lung]
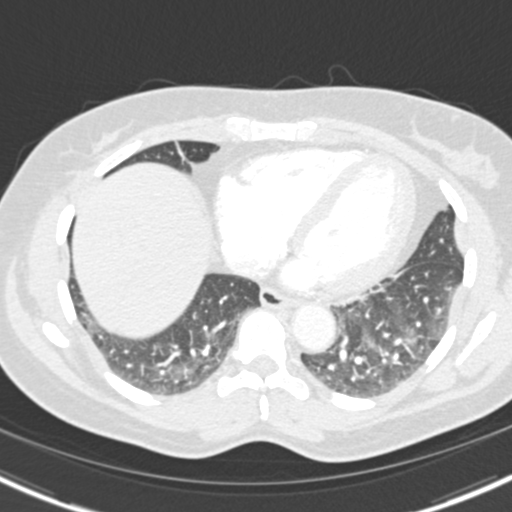
[im 79/235  mediastinal]
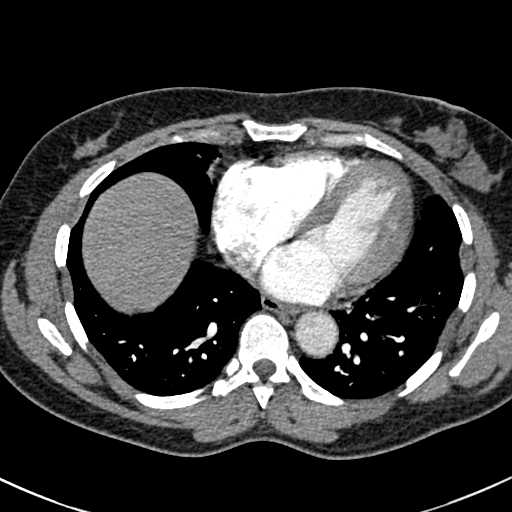
[im 82/235  lung]
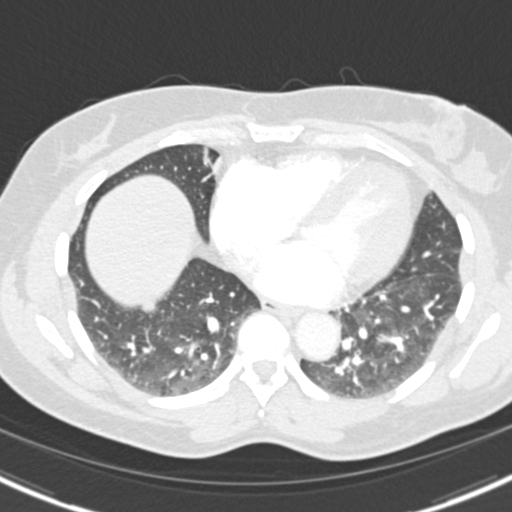
[im 94/235  mediastinal]
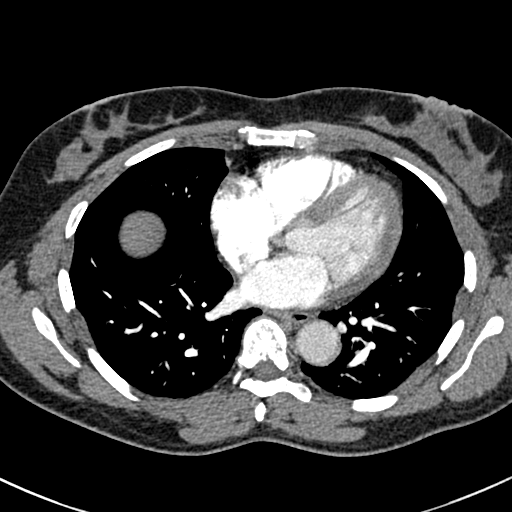
[im 106/235  lung]
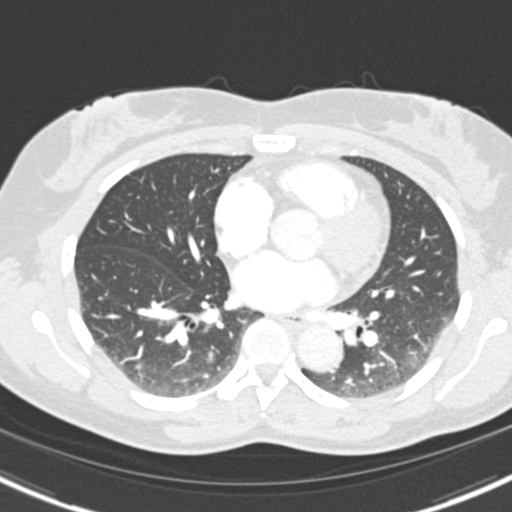
[im 118/235  mediastinal]
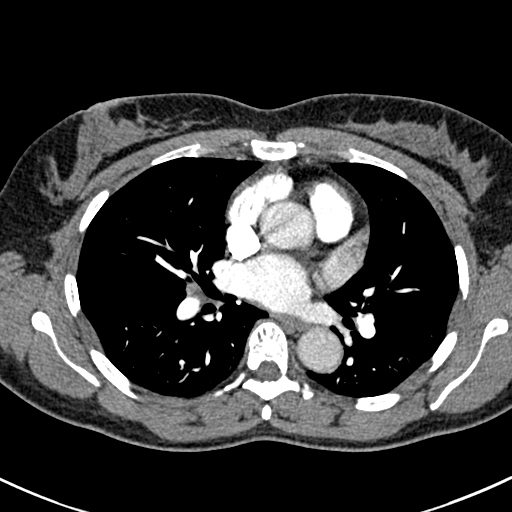
[im 129/235  lung]
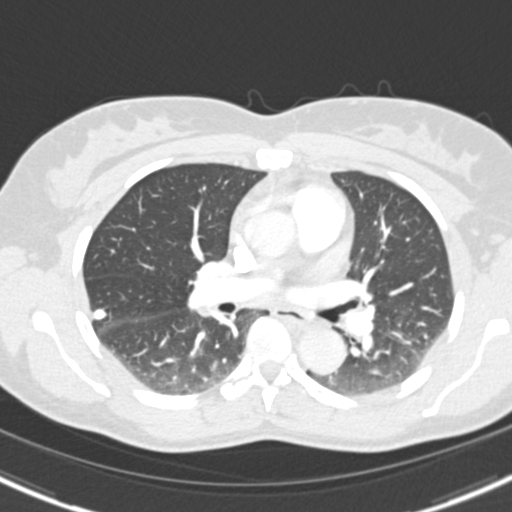
[im 141/235  mediastinal]
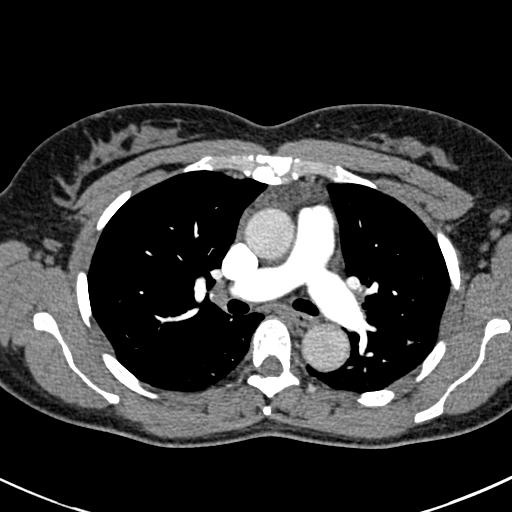
[im 153/235  lung]
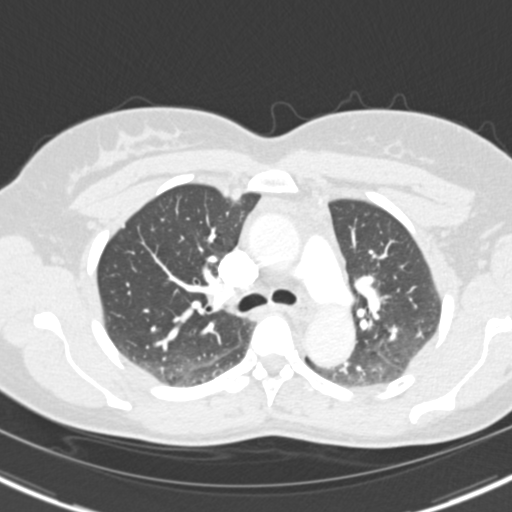
[im 157/235  mediastinal]
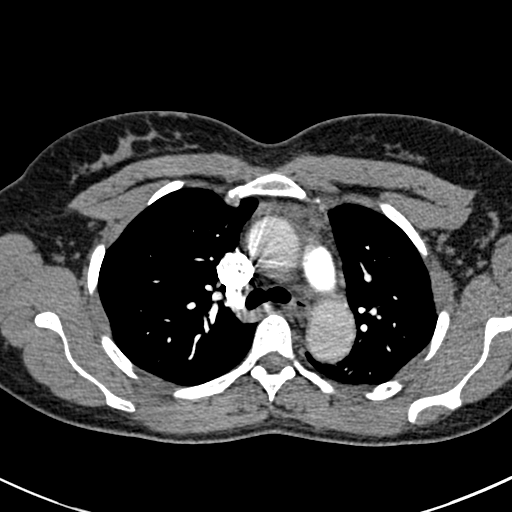
[im 164/235  lung]
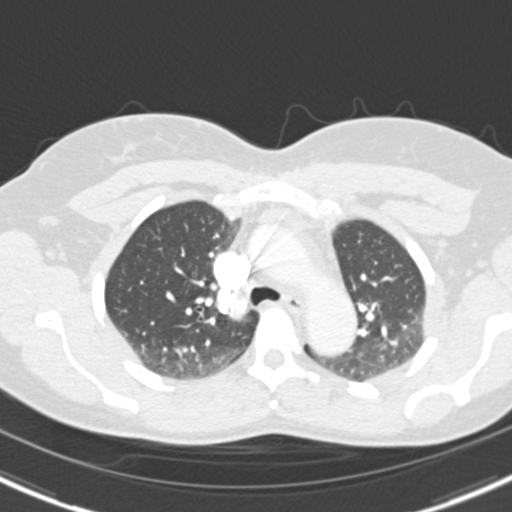
[im 176/235  mediastinal]
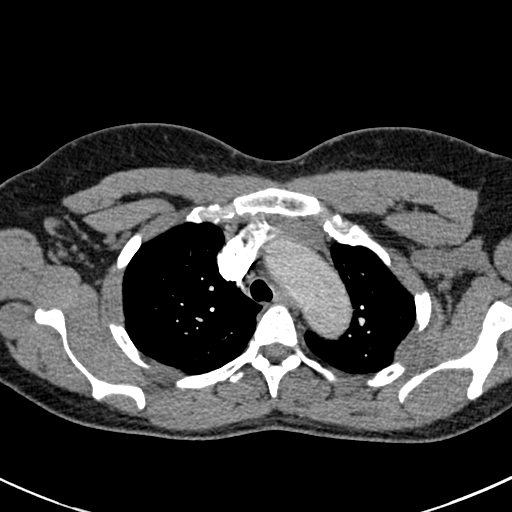
[im 199/235  lung]
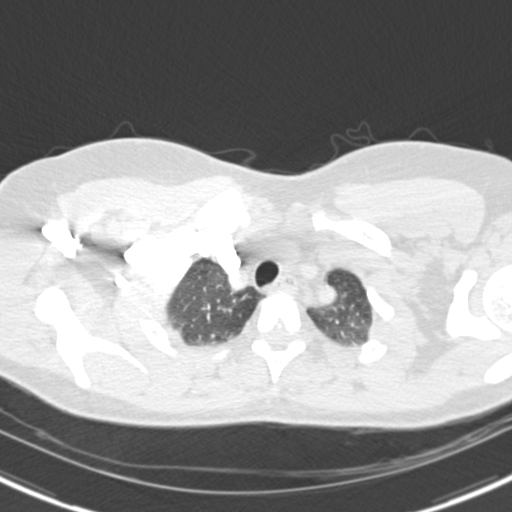
[im 211/235  mediastinal]
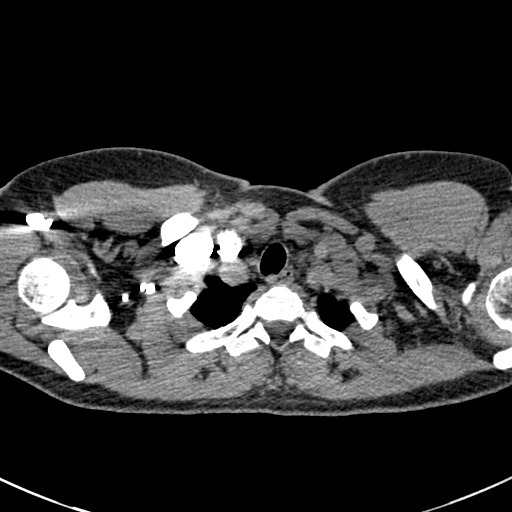
[im 223/235  lung]
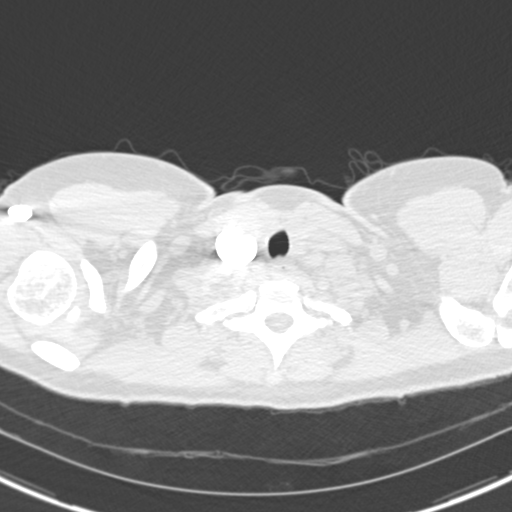

[19 of 32 positions shown; findings below may reference images not displayed]

FINDINGS: There is no demonstrable pulmonary embolus. There is no thoracic
aortic aneurysm or dissection.

There is a calcified granuloma in the posterior segment of the right
upper lobe.

There is mild lower lobe bronchiectatic change bilaterally. There is
patchy atelectasis throughout both lower lobes. There is no airspace
consolidation.

There is no appreciable thoracic adenopathy. Pericardium is not
thickened. There are scattered foci of coronary artery
calcification.

Visualized upper abdominal structures appear unremarkable. There are
no blastic or lytic bone lesions. Thyroid appears unremarkable.

Review of the MIP images confirms the above findings.
IMPRESSION: No demonstrable pulmonary embolus. Calcified granuloma right upper
lobe. Patchy lower lobe atelectatic change bilaterally. No
consolidation. Mild lower lobe bronchiectatic change bilaterally.
There are scattered foci of coronary artery calcification.

## 2021-05-15 ENCOUNTER — Other Ambulatory Visit: Payer: Self-pay | Admitting: Internal Medicine

## 2021-05-15 DIAGNOSIS — C50011 Malignant neoplasm of nipple and areola, right female breast: Secondary | ICD-10-CM
